# Patient Record
Sex: Female | Born: 1960 | Race: White | Hispanic: No | Smoking: Never smoker
Health system: Southern US, Community
[De-identification: ages and names within clinical notes are randomized; demographics above are authoritative.]

## PROBLEM LIST (undated history)

## (undated) DIAGNOSIS — M1990 Unspecified osteoarthritis, unspecified site: Secondary | ICD-10-CM

## (undated) DIAGNOSIS — I10 Essential (primary) hypertension: Secondary | ICD-10-CM

## (undated) DIAGNOSIS — M797 Fibromyalgia: Secondary | ICD-10-CM

## (undated) HISTORY — DX: Fibromyalgia: M79.7

## (undated) HISTORY — DX: Unspecified osteoarthritis, unspecified site: M19.90

---

## 1997-06-10 ENCOUNTER — Encounter: Admission: RE | Admit: 1997-06-10 | Discharge: 1997-09-08 | Payer: Self-pay | Admitting: *Deleted

## 1999-03-13 ENCOUNTER — Other Ambulatory Visit: Admission: RE | Admit: 1999-03-13 | Discharge: 1999-03-13 | Payer: Self-pay | Admitting: Obstetrics and Gynecology

## 2000-06-03 ENCOUNTER — Other Ambulatory Visit: Admission: RE | Admit: 2000-06-03 | Discharge: 2000-06-03 | Payer: Self-pay | Admitting: Obstetrics and Gynecology

## 2002-03-30 ENCOUNTER — Other Ambulatory Visit: Admission: RE | Admit: 2002-03-30 | Discharge: 2002-03-30 | Payer: Self-pay | Admitting: Obstetrics and Gynecology

## 2002-04-08 ENCOUNTER — Encounter: Admission: RE | Admit: 2002-04-08 | Discharge: 2002-04-08 | Payer: Self-pay | Admitting: Obstetrics and Gynecology

## 2002-04-08 ENCOUNTER — Encounter: Payer: Self-pay | Admitting: Obstetrics and Gynecology

## 2003-12-21 ENCOUNTER — Ambulatory Visit (HOSPITAL_COMMUNITY): Admission: RE | Admit: 2003-12-21 | Discharge: 2003-12-21 | Payer: Self-pay | Admitting: Obstetrics and Gynecology

## 2005-01-08 ENCOUNTER — Other Ambulatory Visit: Admission: RE | Admit: 2005-01-08 | Discharge: 2005-01-08 | Payer: Self-pay | Admitting: Family Medicine

## 2005-01-24 ENCOUNTER — Encounter: Admission: RE | Admit: 2005-01-24 | Discharge: 2005-01-24 | Payer: Self-pay | Admitting: Family Medicine

## 2006-01-29 ENCOUNTER — Encounter: Admission: RE | Admit: 2006-01-29 | Discharge: 2006-01-29 | Payer: Self-pay | Admitting: Family Medicine

## 2007-09-15 ENCOUNTER — Encounter: Admission: RE | Admit: 2007-09-15 | Discharge: 2007-09-15 | Payer: Self-pay | Admitting: Family Medicine

## 2007-11-11 ENCOUNTER — Encounter: Admission: RE | Admit: 2007-11-11 | Discharge: 2007-11-11 | Payer: Self-pay | Admitting: Family Medicine

## 2008-05-16 ENCOUNTER — Encounter: Admission: RE | Admit: 2008-05-16 | Discharge: 2008-05-16 | Payer: Self-pay | Admitting: Family Medicine

## 2009-06-13 ENCOUNTER — Encounter: Admission: RE | Admit: 2009-06-13 | Discharge: 2009-06-13 | Payer: Self-pay | Admitting: Family Medicine

## 2011-02-19 ENCOUNTER — Other Ambulatory Visit: Payer: Self-pay | Admitting: Family Medicine

## 2011-02-19 DIAGNOSIS — Z1231 Encounter for screening mammogram for malignant neoplasm of breast: Secondary | ICD-10-CM

## 2011-03-11 ENCOUNTER — Ambulatory Visit: Payer: Self-pay

## 2011-03-11 ENCOUNTER — Ambulatory Visit
Admission: RE | Admit: 2011-03-11 | Discharge: 2011-03-11 | Disposition: A | Discharge status: Home / Self Care | Payer: 59 | Source: Ambulatory Visit | Attending: Family Medicine | Admitting: Family Medicine

## 2011-03-11 DIAGNOSIS — Z1231 Encounter for screening mammogram for malignant neoplasm of breast: Secondary | ICD-10-CM

## 2011-08-28 ENCOUNTER — Other Ambulatory Visit: Payer: Self-pay | Admitting: Family Medicine

## 2011-08-29 ENCOUNTER — Other Ambulatory Visit: Payer: Self-pay | Admitting: Family Medicine

## 2011-08-29 DIAGNOSIS — N6320 Unspecified lump in the left breast, unspecified quadrant: Secondary | ICD-10-CM

## 2011-08-29 DIAGNOSIS — N6310 Unspecified lump in the right breast, unspecified quadrant: Secondary | ICD-10-CM

## 2011-09-04 ENCOUNTER — Other Ambulatory Visit: Payer: 59

## 2011-09-05 ENCOUNTER — Ambulatory Visit
Admission: RE | Admit: 2011-09-05 | Discharge: 2011-09-05 | Disposition: A | Discharge status: Home / Self Care | Payer: 59 | Source: Ambulatory Visit | Attending: Family Medicine | Admitting: Family Medicine

## 2011-09-05 DIAGNOSIS — N6320 Unspecified lump in the left breast, unspecified quadrant: Secondary | ICD-10-CM

## 2011-09-05 DIAGNOSIS — N6310 Unspecified lump in the right breast, unspecified quadrant: Secondary | ICD-10-CM

## 2012-04-07 ENCOUNTER — Other Ambulatory Visit: Payer: Self-pay | Admitting: Radiology

## 2012-04-08 ENCOUNTER — Telehealth: Payer: Self-pay | Admitting: Radiology

## 2012-04-08 NOTE — Telephone Encounter (Signed)
rec'd [facility] from [facility] about Alprazolam 0.5mg  please advise [facility] [address]

## 2012-04-09 NOTE — Telephone Encounter (Signed)
Needs office visit.  Pt has not been seen by [facility] since we've been on epic

## 2012-04-09 NOTE — Telephone Encounter (Signed)
Called [facility] to advise.

## 2012-04-09 NOTE — Telephone Encounter (Signed)
Called pt, unable to leave VM

## 2012-05-05 ENCOUNTER — Other Ambulatory Visit: Payer: Self-pay | Admitting: Radiology

## 2012-05-05 NOTE — Telephone Encounter (Signed)
Patient needs office visit for meds. Faxed to pharmacy

## 2012-10-21 ENCOUNTER — Ambulatory Visit
Admission: RE | Admit: 2012-10-21 | Discharge: 2012-10-21 | Disposition: A | Discharge status: Home / Self Care | Payer: 59 | Source: Ambulatory Visit

## 2012-10-21 ENCOUNTER — Other Ambulatory Visit: Payer: Self-pay

## 2012-10-21 DIAGNOSIS — Z1231 Encounter for screening mammogram for malignant neoplasm of breast: Secondary | ICD-10-CM

## 2013-10-30 ENCOUNTER — Emergency Department (HOSPITAL_BASED_OUTPATIENT_CLINIC_OR_DEPARTMENT_OTHER)
Admission: EM | Admit: 2013-10-30 | Discharge: 2013-10-30 | Disposition: A | Discharge status: Home / Self Care | Payer: 59 | Attending: Emergency Medicine | Admitting: Emergency Medicine

## 2013-10-30 ENCOUNTER — Emergency Department (HOSPITAL_BASED_OUTPATIENT_CLINIC_OR_DEPARTMENT_OTHER): Payer: 59

## 2013-10-30 ENCOUNTER — Encounter (HOSPITAL_BASED_OUTPATIENT_CLINIC_OR_DEPARTMENT_OTHER): Payer: Self-pay | Admitting: Emergency Medicine

## 2013-10-30 DIAGNOSIS — S4980XA Other specified injuries of shoulder and upper arm, unspecified arm, initial encounter: Secondary | ICD-10-CM | POA: Insufficient documentation

## 2013-10-30 DIAGNOSIS — IMO0002 Reserved for concepts with insufficient information to code with codable children: Secondary | ICD-10-CM | POA: Insufficient documentation

## 2013-10-30 DIAGNOSIS — S43401A Unspecified sprain of right shoulder joint, initial encounter: Secondary | ICD-10-CM

## 2013-10-30 DIAGNOSIS — S46909A Unspecified injury of unspecified muscle, fascia and tendon at shoulder and upper arm level, unspecified arm, initial encounter: Secondary | ICD-10-CM | POA: Diagnosis present

## 2013-10-30 DIAGNOSIS — I10 Essential (primary) hypertension: Secondary | ICD-10-CM | POA: Insufficient documentation

## 2013-10-30 DIAGNOSIS — Z79899 Other long term (current) drug therapy: Secondary | ICD-10-CM | POA: Diagnosis not present

## 2013-10-30 DIAGNOSIS — Y9389 Activity, other specified: Secondary | ICD-10-CM | POA: Insufficient documentation

## 2013-10-30 DIAGNOSIS — X58XXXA Exposure to other specified factors, initial encounter: Secondary | ICD-10-CM | POA: Diagnosis not present

## 2013-10-30 DIAGNOSIS — Y9289 Other specified places as the place of occurrence of the external cause: Secondary | ICD-10-CM | POA: Diagnosis not present

## 2013-10-30 HISTORY — DX: Essential (primary) hypertension: I10

## 2013-10-30 MED ORDER — OXYCODONE-ACETAMINOPHEN 5-325 MG PO TABS: 1.0000 | ORAL_TABLET | Freq: Four times a day (QID) | ORAL | Status: DC | PRN

## 2013-10-30 MED ORDER — HYDROCODONE-ACETAMINOPHEN 5-325 MG PO TABS: 1.0000 | ORAL_TABLET | Freq: Four times a day (QID) | ORAL | Status: DC | PRN

## 2013-10-30 MED ORDER — IBUPROFEN 600 MG PO TABS
600.0000 mg | ORAL_TABLET | Freq: Four times a day (QID) | ORAL | Status: DC | PRN
Start: 2013-10-30 — End: 2019-04-14

## 2013-10-30 NOTE — ED Provider Notes (Signed)
CSN: [id number]     Arrival date & time 10/30/13  1238 History   First MD Initiated Contact with Patient 10/30/13 1249     Chief Complaint  Patient presents with  . Arm Pain     (Consider location/radiation/quality/duration/timing/severity/associated sxs/prior Treatment) HPI  Presents with right arm pain. She states that she forcibly opened her umbrella yesterday and since that time has had pain with range of motion and limited range of motion. She is tried hydrocodone at home with minimal relief. She is a [employer] and has had difficulty doing her job today.  Current pain is 7/10.  Past Medical History  Diagnosis Date  . Hypertension    History reviewed. No pertinent past surgical history. No family history on file. History  Substance Use Topics  . Smoking status: Never Smoker   . Smokeless tobacco: Not on file  . Alcohol Use: Not on file   OB History   Grav Para Term Preterm Abortions TAB SAB Ect Mult Living                 Review of Systems  Musculoskeletal:       Right arm pain  Skin: Negative for wound.      Allergies  Review of patient's allergies indicates no known allergies.  Home Medications   Prior to Admission medications   Medication Sig Start Date End Date Taking? Authorizing Provider  hydrochlorothiazide (HYDRODIURIL) 25 MG tablet Take 25 mg by mouth daily.   Yes Historical Provider, MD  ibuprofen (ADVIL,MOTRIN) 600 MG tablet Take 1 tablet (600 mg total) by mouth every 6 (six) hours as needed. 10/30/13   Merryl Hacker, MD  oxyCODONE-acetaminophen (PERCOCET/ROXICET) 5-325 MG per tablet Take 1 tablet by mouth every 6 (six) hours as needed for moderate pain or severe pain. 10/30/13   Merryl Hacker, MD   BP 130/97  Pulse 67  Temp(Src) 98.2 F (36.8 C) (Oral)  Resp 18  Ht 5\' 4"  (1.626 m)  Wt 197 lb (89.359 kg)  BMI 33.80 kg/m2  SpO2 98%  LMP 10/25/2013 Physical Exam  Nursing note and vitals reviewed. Constitutional: She is oriented to  person, place, and time. She appears well-developed and well-nourished. No distress.  HENT:  Head: Normocephalic and atraumatic.  Cardiovascular: Normal rate and regular rhythm.   Pulmonary/Chest: Effort normal. No respiratory distress.  Musculoskeletal: She exhibits no edema.  This examination of the right shoulder reveals no obvious deformity, patient has good passive range of motion limited active range of motion secondary to pain, 2+ radial pulse, neurovascularly intact distally, 5 out of 5 strength with grip and biceps strength, 4+ out of 5 deltoid raise  Neurological: She is alert and oriented to person, place, and time.  Skin: Skin is warm and dry.  Psychiatric: She has a normal mood and affect.    ED Course  Procedures (including critical care time) Labs Review Labs Reviewed - No data to display  Imaging Review Dg Shoulder Right  10/30/2013   CLINICAL DATA:  Injury.  EXAM: RIGHT SHOULDER - 2+ VIEW  COMPARISON:  None.  FINDINGS: Acromioclavicular and glenohumeral degenerative change. No evidence of fracture dislocation.  IMPRESSION: No acute abnormality .   Electronically Signed   By: Marcello Moores  Register   On: 10/30/2013 14:17     EKG Interpretation None      MDM   Final diagnoses:  Shoulder sprain, right, initial encounter    Patient presents with right shoulder pain. Nontoxic and neurovascular tach on  exam. X-rays negative for fracture. Patient is driving so was not given pain medication. Discussed with patient negative x-rays. She's used rest ice as well as pain medication. She may have a shoulder sprain or rotator cuff injury. At this time there is no emergent indication for MRI.  Patient was given referral to Dr. Barbaraann Barthel.  After history, exam, and medical workup I feel the patient has been appropriately medically screened and is safe for discharge home. Pertinent diagnoses were discussed with the patient. Patient was given return precautions.     Merryl Hacker,  MD 10/31/13 [phone]

## 2013-10-30 NOTE — Discharge Instructions (Signed)
Shoulder Sprain °A shoulder sprain is the result of damage to the tough, fiber-like tissues (ligaments) that help hold your shoulder in place. The ligaments may be stretched or torn. Besides the main shoulder joint (the ball and socket), there are several smaller joints that connect the bones in this area. A sprain usually involves one of those joints. Most often it is the acromioclavicular (or AC) joint. That is the joint that connects the collarbone (clavicle) and the shoulder blade (scapula) at the top point of the shoulder blade (acromion). °A shoulder sprain is a mild form of what is called a shoulder separation. Recovering from a shoulder sprain may take some time. For some, pain lingers for several months. Most people recover without long term problems. °CAUSES  °· A shoulder sprain is usually caused by some kind of trauma. This might be: °¨ Falling on an outstretched arm. °¨ Being hit hard on the shoulder. °¨ Twisting the arm. °· Shoulder sprains are more likely to occur in people who: °¨ Play sports. °¨ Have balance or coordination problems. °SYMPTOMS  °· Pain when you move your shoulder. °· Limited ability to move the shoulder. °· Swelling and tenderness on top of the shoulder. °· Redness or warmth in the shoulder. °· Bruising. °· A change in the shape of the shoulder. °DIAGNOSIS  °Your healthcare provider may: °· Ask about your symptoms. °· Ask about recent activity that might have caused those symptoms. °· Examine your shoulder. You may be asked to do simple exercises to test movement. The other shoulder will be examined for comparison. °· Order some tests that provide a look inside the body. They can show the extent of the injury. The tests could include: °¨ X-rays. °¨ CT (computed tomography) scan. °¨ MRI (magnetic resonance imaging) scan. °RISKS AND COMPLICATIONS °· Loss of full shoulder motion. °· Ongoing shoulder pain. °TREATMENT  °How long it takes to recover from a shoulder sprain depends on how  severe it was. Treatment options may include: °· Rest. You should not use the arm or shoulder until it heals. °· Ice. For 2 or 3 days after the injury, put an ice pack on the shoulder up to 4 times a day. It should stay on for 15 to 20 minutes each time. Wrap the ice in a towel so it does not touch your skin. °· Over-the-counter medicine to relieve pain. °· A sling or brace. This will keep the arm still while the shoulder is healing. °· Physical therapy or rehabilitation exercises. These will help you regain strength and motion. Ask your healthcare provider when it is OK to begin these exercises. °· Surgery. The need for surgery is rare with a sprained shoulder, but some people may need surgery to keep the joint in place and reduce pain. °HOME CARE INSTRUCTIONS  °· Ask your healthcare provider about what you should and should not do while your shoulder heals. °· Make sure you know how to apply ice to the correct area of your shoulder. °· Talk with your healthcare provider about which medications should be used for pain and swelling. °· If rehabilitation therapy will be needed, ask your healthcare provider to refer you to a therapist. If it is not recommended, then ask about at-home exercises. Find out when exercise should begin. °SEEK MEDICAL CARE IF:  °Your pain, swelling, or redness at the joint increases. °SEEK IMMEDIATE MEDICAL CARE IF:  °· You have a fever. °· You cannot move your arm or shoulder. °Document Released: 06/09/2008 Document   Revised: 04/15/2011 Document Reviewed: 06/09/2008 °[employer]® Patient Information ©2015 [employer]. This information is not intended to replace advice given to you by your health care provider. Make sure you discuss any questions you have with your health care provider. ° °

## 2013-10-30 NOTE — ED Notes (Signed)
Pt presents to ED with complaints of rt arm pain after opening an umbrella yesterday. 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

## 2013-11-12 ENCOUNTER — Other Ambulatory Visit: Payer: Self-pay

## 2013-11-12 DIAGNOSIS — Z1231 Encounter for screening mammogram for malignant neoplasm of breast: Secondary | ICD-10-CM

## 2013-11-22 ENCOUNTER — Ambulatory Visit
Admission: RE | Admit: 2013-11-22 | Discharge: 2013-11-22 | Disposition: A | Discharge status: Home / Self Care | Payer: 59 | Source: Ambulatory Visit

## 2013-11-22 DIAGNOSIS — Z1231 Encounter for screening mammogram for malignant neoplasm of breast: Secondary | ICD-10-CM

## 2014-01-04 ENCOUNTER — Other Ambulatory Visit: Payer: Self-pay | Admitting: Family Medicine

## 2014-07-11 ENCOUNTER — Ambulatory Visit (HOSPITAL_COMMUNITY): Payer: Self-pay | Admitting: Psychiatry

## 2014-09-08 ENCOUNTER — Telehealth: Payer: Self-pay | Admitting: Behavioral Health

## 2014-09-08 NOTE — Telephone Encounter (Signed)
Unable to reach patient at time of Pre-Visit Call.  Left message for patient to return call when available.    

## 2014-09-09 ENCOUNTER — Encounter: Payer: Self-pay | Admitting: Family Medicine

## 2014-09-09 ENCOUNTER — Ambulatory Visit (INDEPENDENT_AMBULATORY_CARE_PROVIDER_SITE_OTHER): Payer: 59 | Admitting: Family Medicine

## 2014-09-09 ENCOUNTER — Other Ambulatory Visit (HOSPITAL_COMMUNITY)
Admission: RE | Admit: 2014-09-09 | Discharge: 2014-09-09 | Disposition: A | Discharge status: Home / Self Care | Payer: 59 | Source: Ambulatory Visit | Attending: Family Medicine | Admitting: Family Medicine

## 2014-09-09 VITALS — BP 130/80 | HR 72 | Temp 98.4°F | Ht 65.0 in | Wt 195.8 lb

## 2014-09-09 DIAGNOSIS — M797 Fibromyalgia: Secondary | ICD-10-CM

## 2014-09-09 DIAGNOSIS — K219 Gastro-esophageal reflux disease without esophagitis: Secondary | ICD-10-CM

## 2014-09-09 DIAGNOSIS — Z124 Encounter for screening for malignant neoplasm of cervix: Secondary | ICD-10-CM | POA: Diagnosis not present

## 2014-09-09 DIAGNOSIS — M1990 Unspecified osteoarthritis, unspecified site: Secondary | ICD-10-CM

## 2014-09-09 DIAGNOSIS — Z0000 Encounter for general adult medical examination without abnormal findings: Secondary | ICD-10-CM

## 2014-09-09 DIAGNOSIS — I10 Essential (primary) hypertension: Secondary | ICD-10-CM

## 2014-09-09 DIAGNOSIS — Z1151 Encounter for screening for human papillomavirus (HPV): Secondary | ICD-10-CM | POA: Diagnosis present

## 2014-09-09 DIAGNOSIS — F411 Generalized anxiety disorder: Secondary | ICD-10-CM | POA: Diagnosis not present

## 2014-09-09 DIAGNOSIS — G4700 Insomnia, unspecified: Secondary | ICD-10-CM | POA: Diagnosis not present

## 2014-09-09 DIAGNOSIS — Z01419 Encounter for gynecological examination (general) (routine) without abnormal findings: Secondary | ICD-10-CM | POA: Insufficient documentation

## 2014-09-09 LAB — HEPATIC FUNCTION PANEL
ALT: 17 U/L (ref 6–29)
AST: 19 U/L (ref 10–35)
Albumin: 4.1 g/dL (ref 3.6–5.1)
Alkaline Phosphatase: 73 U/L (ref 33–130)
Bilirubin, Direct: 0.1 mg/dL (ref <=0.2)
Indirect Bilirubin: 0.3 mg/dL (ref 0.2–1.2)
Total Bilirubin: 0.4 mg/dL (ref 0.2–1.2)
Total Protein: 6.5 g/dL (ref 6.1–8.1)

## 2014-09-09 LAB — BASIC METABOLIC PANEL
BUN: 12 mg/dL (ref 7–25)
CO2: 27 mmol/L (ref 20–31)
Calcium: 9.1 mg/dL (ref 8.6–10.4)
Chloride: 101 mmol/L (ref 98–110)
Creat: 0.72 mg/dL (ref 0.50–1.05)
Glucose, Bld: 105 mg/dL — ABNORMAL HIGH (ref 65–99)
Potassium: 3.4 mmol/L — ABNORMAL LOW (ref 3.5–5.3)
Sodium: 139 mmol/L (ref 135–146)

## 2014-09-09 LAB — LIPID PANEL
Cholesterol: 278 mg/dL — ABNORMAL HIGH (ref 125–200)
HDL: 45 mg/dL — ABNORMAL LOW (ref >=46)
LDL Cholesterol: 193 mg/dL — ABNORMAL HIGH (ref <130)
Total CHOL/HDL Ratio: 6.2 Ratio — ABNORMAL HIGH (ref <=5.0)
Triglycerides: 201 mg/dL — ABNORMAL HIGH (ref <150)
VLDL: 40 mg/dL — ABNORMAL HIGH (ref <30)

## 2014-09-09 LAB — TSH: TSH: 1.994 u[IU]/mL (ref 0.350–4.500)

## 2014-09-09 LAB — RHEUMATOID FACTOR: Rhuematoid fact SerPl-aCnc: <10 IU/mL (ref <=14)

## 2014-09-09 MED ORDER — OMEPRAZOLE 20 MG PO CPDR: 20.0000 mg | DELAYED_RELEASE_CAPSULE | Freq: Every day | ORAL | Status: DC

## 2014-09-09 MED ORDER — CYCLOBENZAPRINE HCL 5 MG PO TABS: 5.0000 mg | ORAL_TABLET | Freq: Three times a day (TID) | ORAL | Status: DC | PRN

## 2014-09-09 MED ORDER — OXYCODONE-ACETAMINOPHEN 5-325 MG PO TABS: 1.0000 | ORAL_TABLET | Freq: Four times a day (QID) | ORAL | Status: DC | PRN

## 2014-09-09 MED ORDER — HYDROCHLOROTHIAZIDE 25 MG PO TABS: 25.0000 mg | ORAL_TABLET | Freq: Every day | ORAL | Status: DC

## 2014-09-09 MED ORDER — SUVOREXANT 10 MG PO TABS: 10.0000 | ORAL_TABLET | Freq: Every evening | ORAL | Status: DC | PRN

## 2014-09-09 MED ORDER — ALPRAZOLAM 0.5 MG PO TABS: 0.5000 mg | ORAL_TABLET | Freq: Three times a day (TID) | ORAL | Status: DC | PRN

## 2014-09-09 NOTE — Patient Instructions (Signed)
Preventive Care for Adults A healthy lifestyle and preventive care can promote health and wellness. Preventive health guidelines for women include the following key practices.  A routine yearly physical is a good way to check with your health care provider about your health and preventive screening. It is a chance to share any concerns and updates on your health and to receive a thorough exam.  Visit your dentist for a routine exam and preventive care every 6 months. Brush your teeth twice a day and floss once a day. Good oral hygiene prevents tooth decay and gum disease.  The frequency of eye exams is based on your age, health, family medical history, use of contact lenses, and other factors. Follow your health care provider's recommendations for frequency of eye exams.  Eat a healthy diet. Foods like vegetables, fruits, whole grains, low-fat dairy products, and lean protein foods contain the nutrients you need without too many calories. Decrease your intake of foods high in solid fats, added sugars, and salt. Eat the right amount of calories for you.Get information about a proper diet from your health care provider, if necessary.  Regular physical exercise is one of the most important things you can do for your health. Most adults should get at least 150 minutes of moderate-intensity exercise (any activity that increases your heart rate and causes you to sweat) each week. In addition, most adults need muscle-strengthening exercises on 2 or more days a week.  Maintain a healthy weight. The body mass index (BMI) is a screening tool to identify possible weight problems. It provides an estimate of body fat based on height and weight. Your health care provider can find your BMI and can help you achieve or maintain a healthy weight.For adults 20 years and older:  A BMI below 18.5 is considered underweight.  A BMI of 18.5 to 24.9 is normal.  A BMI of 25 to 29.9 is considered overweight.  A BMI of  30 and above is considered obese.  Maintain normal blood lipids and cholesterol levels by exercising and minimizing your intake of saturated fat. Eat a balanced diet with plenty of fruit and vegetables. Blood tests for lipids and cholesterol should begin at age 76 and be repeated every 5 years. If your lipid or cholesterol levels are high, you are over 50, or you are at high risk for heart disease, you may need your cholesterol levels checked more frequently.Ongoing high lipid and cholesterol levels should be treated with medicines if diet and exercise are not working.  If you smoke, find out from your health care provider how to quit. If you do not use tobacco, do not start.  Lung cancer screening is recommended for adults aged 22-80 years who are at high risk for developing lung cancer because of a history of smoking. A yearly low-dose CT scan of the lungs is recommended for people who have at least a 30-pack-year history of smoking and are a current smoker or have quit within the past 15 years. A pack year of smoking is smoking an average of 1 pack of cigarettes a day for 1 year (for example: 1 pack a day for 30 years or 2 packs a day for 15 years). Yearly screening should continue until the smoker has stopped smoking for at least 15 years. Yearly screening should be stopped for people who develop a health problem that would prevent them from having lung cancer treatment.  If you are pregnant, do not drink alcohol. If you are breastfeeding,  be very cautious about drinking alcohol. If you are not pregnant and choose to drink alcohol, do not have more than 1 drink per day. One drink is considered to be 12 ounces (355 mL) of beer, 5 ounces (148 mL) of wine, or 1.5 ounces (44 mL) of liquor.  Avoid use of street drugs. Do not share needles with anyone. Ask for help if you need support or instructions about stopping the use of drugs.  High blood pressure causes heart disease and increases the risk of  stroke. Your blood pressure should be checked at least every 1 to 2 years. Ongoing high blood pressure should be treated with medicines if weight loss and exercise do not work.  If you are 75-52 years old, ask your health care provider if you should take aspirin to prevent strokes.  Diabetes screening involves taking a blood sample to check your fasting blood sugar level. This should be done once every 3 years, after age 15, if you are within normal weight and without risk factors for diabetes. Testing should be considered at a younger age or be carried out more frequently if you are overweight and have at least 1 risk factor for diabetes.  Breast cancer screening is essential preventive care for women. You should practice "breast self-awareness." This means understanding the normal appearance and feel of your breasts and may include breast self-examination. Any changes detected, no matter how small, should be reported to a health care provider. Women in their 58s and 30s should have a clinical breast exam (CBE) by a health care provider as part of a regular health exam every 1 to 3 years. After age 16, women should have a CBE every year. Starting at age 53, women should consider having a mammogram (breast X-ray test) every year. Women who have a family history of breast cancer should talk to their health care provider about genetic screening. Women at a high risk of breast cancer should talk to their health care providers about having an MRI and a mammogram every year.  Breast cancer gene (BRCA)-related cancer risk assessment is recommended for women who have family members with BRCA-related cancers. BRCA-related cancers include breast, ovarian, tubal, and peritoneal cancers. Having family members with these cancers may be associated with an increased risk for harmful changes (mutations) in the breast cancer genes BRCA1 and BRCA2. Results of the assessment will determine the need for genetic counseling and  BRCA1 and BRCA2 testing.  Routine pelvic exams to screen for cancer are no longer recommended for nonpregnant women who are considered low risk for cancer of the pelvic organs (ovaries, uterus, and vagina) and who do not have symptoms. Ask your health care provider if a screening pelvic exam is right for you.  If you have had past treatment for cervical cancer or a condition that could lead to cancer, you need Pap tests and screening for cancer for at least 20 years after your treatment. If Pap tests have been discontinued, your risk factors (such as having a new sexual partner) need to be reassessed to determine if screening should be resumed. Some women have medical problems that increase the chance of getting cervical cancer. In these cases, your health care provider may recommend more frequent screening and Pap tests.  The HPV test is an additional test that may be used for cervical cancer screening. The HPV test looks for the virus that can cause the cell changes on the cervix. The cells collected during the Pap test can be  tested for HPV. The HPV test could be used to screen women aged 30 years and older, and should be used in women of any age who have unclear Pap test results. After the age of 30, women should have HPV testing at the same frequency as a Pap test.  Colorectal cancer can be detected and often prevented. Most routine colorectal cancer screening begins at the age of 50 years and continues through age 75 years. However, your health care provider may recommend screening at an earlier age if you have risk factors for colon cancer. On a yearly basis, your health care provider may provide home test kits to check for hidden blood in the stool. Use of a small camera at the end of a tube, to directly examine the colon (sigmoidoscopy or colonoscopy), can detect the earliest forms of colorectal cancer. Talk to your health care provider about this at age 50, when routine screening begins. Direct  exam of the colon should be repeated every 5-10 years through age 75 years, unless early forms of pre-cancerous polyps or small growths are found.  People who are at an increased risk for hepatitis B should be screened for this virus. You are considered at high risk for hepatitis B if:  You were born in a country where hepatitis B occurs often. Talk with your health care provider about which countries are considered high risk.  Your parents were born in a high-risk country and you have not received a shot to protect against hepatitis B (hepatitis B vaccine).  You have HIV or AIDS.  You use needles to inject street drugs.  You live with, or have sex with, someone who has hepatitis B.  You get hemodialysis treatment.  You take certain medicines for conditions like cancer, organ transplantation, and autoimmune conditions.  Hepatitis C blood testing is recommended for all people born from 1945 through 1965 and any individual with known risks for hepatitis C.  Practice safe sex. Use condoms and avoid high-risk sexual practices to reduce the spread of sexually transmitted infections (STIs). STIs include gonorrhea, chlamydia, syphilis, trichomonas, herpes, HPV, and human immunodeficiency virus (HIV). Herpes, HIV, and HPV are viral illnesses that have no cure. They can result in disability, cancer, and death.  You should be screened for sexually transmitted illnesses (STIs) including gonorrhea and chlamydia if:  You are sexually active and are younger than 24 years.  You are older than 24 years and your health care provider tells you that you are at risk for this type of infection.  Your sexual activity has changed since you were last screened and you are at an increased risk for chlamydia or gonorrhea. Ask your health care provider if you are at risk.  If you are at risk of being infected with HIV, it is recommended that you take a prescription medicine daily to prevent HIV infection. This is  called preexposure prophylaxis (PrEP). You are considered at risk if:  You are a heterosexual woman, are sexually active, and are at increased risk for HIV infection.  You take drugs by injection.  You are sexually active with a partner who has HIV.  Talk with your health care provider about whether you are at high risk of being infected with HIV. If you choose to begin PrEP, you should first be tested for HIV. You should then be tested every 3 months for as long as you are taking PrEP.  Osteoporosis is a disease in which the bones lose minerals and strength   with aging. This can result in serious bone fractures or breaks. The risk of osteoporosis can be identified using a bone density scan. Women ages 65 years and over and women at risk for fractures or osteoporosis should discuss screening with their health care providers. Ask your health care provider whether you should take a calcium supplement or vitamin D to reduce the rate of osteoporosis.  Menopause can be associated with physical symptoms and risks. Hormone replacement therapy is available to decrease symptoms and risks. You should talk to your health care provider about whether hormone replacement therapy is right for you.  Use sunscreen. Apply sunscreen liberally and repeatedly throughout the day. You should seek shade when your shadow is shorter than you. Protect yourself by wearing long sleeves, pants, a wide-brimmed hat, and sunglasses year round, whenever you are outdoors.  Once a month, do a whole body skin exam, using a mirror to look at the skin on your back. Tell your health care provider of new moles, moles that have irregular borders, moles that are larger than a pencil eraser, or moles that have changed in shape or color.  Stay current with required vaccines (immunizations).  Influenza vaccine. All adults should be immunized every year.  Tetanus, diphtheria, and acellular pertussis (Td, Tdap) vaccine. Pregnant women should  receive 1 dose of Tdap vaccine during each pregnancy. The dose should be obtained regardless of the length of time since the last dose. Immunization is preferred during the 27th-36th week of gestation. An adult who has not previously received Tdap or who does not know her vaccine status should receive 1 dose of Tdap. This initial dose should be followed by tetanus and diphtheria toxoids (Td) booster doses every 10 years. Adults with an unknown or incomplete history of completing a 3-dose immunization series with Td-containing vaccines should begin or complete a primary immunization series including a Tdap dose. Adults should receive a Td booster every 10 years.  Varicella vaccine. An adult without evidence of immunity to varicella should receive 2 doses or a second dose if she has previously received 1 dose. Pregnant females who do not have evidence of immunity should receive the first dose after pregnancy. This first dose should be obtained before leaving the health care facility. The second dose should be obtained 4-8 weeks after the first dose.  Human papillomavirus (HPV) vaccine. Females aged 13-26 years who have not received the vaccine previously should obtain the 3-dose series. The vaccine is not recommended for use in pregnant females. However, pregnancy testing is not needed before receiving a dose. If a female is found to be pregnant after receiving a dose, no treatment is needed. In that case, the remaining doses should be delayed until after the pregnancy. Immunization is recommended for any person with an immunocompromised condition through the age of 26 years if she did not get any or all doses earlier. During the 3-dose series, the second dose should be obtained 4-8 weeks after the first dose. The third dose should be obtained 24 weeks after the first dose and 16 weeks after the second dose.  Zoster vaccine. One dose is recommended for adults aged 60 years or older unless certain conditions are  present.  Measles, mumps, and rubella (MMR) vaccine. Adults born before 1957 generally are considered immune to measles and mumps. Adults born in 1957 or later should have 1 or more doses of MMR vaccine unless there is a contraindication to the vaccine or there is laboratory evidence of immunity to   each of the three diseases. A routine second dose of MMR vaccine should be obtained at least 28 days after the first dose for students attending postsecondary schools, health care workers, or international travelers. People who received inactivated measles vaccine or an unknown type of measles vaccine during 1963-1967 should receive 2 doses of MMR vaccine. People who received inactivated mumps vaccine or an unknown type of mumps vaccine before 1979 and are at high risk for mumps infection should consider immunization with 2 doses of MMR vaccine. For females of childbearing age, rubella immunity should be determined. If there is no evidence of immunity, females who are not pregnant should be vaccinated. If there is no evidence of immunity, females who are pregnant should delay immunization until after pregnancy. Unvaccinated health care workers born before 1957 who lack laboratory evidence of measles, mumps, or rubella immunity or laboratory confirmation of disease should consider measles and mumps immunization with 2 doses of MMR vaccine or rubella immunization with 1 dose of MMR vaccine.  Pneumococcal 13-valent conjugate (PCV13) vaccine. When indicated, a person who is uncertain of her immunization history and has no record of immunization should receive the PCV13 vaccine. An adult aged 19 years or older who has certain medical conditions and has not been previously immunized should receive 1 dose of PCV13 vaccine. This PCV13 should be followed with a dose of pneumococcal polysaccharide (PPSV23) vaccine. The PPSV23 vaccine dose should be obtained at least 8 weeks after the dose of PCV13 vaccine. An adult aged 19  years or older who has certain medical conditions and previously received 1 or more doses of PPSV23 vaccine should receive 1 dose of PCV13. The PCV13 vaccine dose should be obtained 1 or more years after the last PPSV23 vaccine dose.  Pneumococcal polysaccharide (PPSV23) vaccine. When PCV13 is also indicated, PCV13 should be obtained first. All adults aged 65 years and older should be immunized. An adult younger than age 65 years who has certain medical conditions should be immunized. Any person who resides in a nursing home or long-term care facility should be immunized. An adult smoker should be immunized. People with an immunocompromised condition and certain other conditions should receive both PCV13 and PPSV23 vaccines. People with human immunodeficiency virus (HIV) infection should be immunized as soon as possible after diagnosis. Immunization during chemotherapy or radiation therapy should be avoided. Routine use of PPSV23 vaccine is not recommended for American Indians, [place] Natives, or people younger than 65 years unless there are medical conditions that require PPSV23 vaccine. When indicated, people who have unknown immunization and have no record of immunization should receive PPSV23 vaccine. One-time revaccination 5 years after the first dose of PPSV23 is recommended for people aged 19-64 years who have chronic kidney failure, nephrotic syndrome, asplenia, or immunocompromised conditions. People who received 1-2 doses of PPSV23 before age 65 years should receive another dose of PPSV23 vaccine at age 65 years or later if at least 5 years have passed since the previous dose. Doses of PPSV23 are not needed for people immunized with PPSV23 at or after age 65 years.  Meningococcal vaccine. Adults with asplenia or persistent complement component deficiencies should receive 2 doses of quadrivalent meningococcal conjugate (MenACWY-D) vaccine. The doses should be obtained at least 2 months apart.  Microbiologists working with certain meningococcal bacteria, military recruits, people at risk during an outbreak, and people who travel to or live in countries with a high rate of meningitis should be immunized. A first-year college student up through age   21 years who is living in a residence hall should receive a dose if she did not receive a dose on or after her 16th birthday. Adults who have certain high-risk conditions should receive one or more doses of vaccine.  Hepatitis A vaccine. Adults who wish to be protected from this disease, have certain high-risk conditions, work with hepatitis A-infected animals, work in hepatitis A research labs, or travel to or work in countries with a high rate of hepatitis A should be immunized. Adults who were previously unvaccinated and who anticipate close contact with an international adoptee during the first 60 days after arrival in the [place] from a country with a high rate of hepatitis A should be immunized.  Hepatitis B vaccine. Adults who wish to be protected from this disease, have certain high-risk conditions, may be exposed to blood or other infectious body fluids, are household contacts or sex partners of hepatitis B positive people, are clients or workers in certain care facilities, or travel to or work in countries with a high rate of hepatitis B should be immunized.  Haemophilus influenzae type b (Hib) vaccine. A previously unvaccinated person with asplenia or sickle cell disease or having a scheduled splenectomy should receive 1 dose of Hib vaccine. Regardless of previous immunization, a recipient of a hematopoietic stem cell transplant should receive a 3-dose series 6-12 months after her successful transplant. Hib vaccine is not recommended for adults with HIV infection. Preventive Services / Frequency Ages 64 to 68 years  Blood pressure check.** / Every 1 to 2 years.  Lipid and cholesterol check.** / Every 5 years beginning at age  22.  Clinical breast exam.** / Every 3 years for women in their 88s and 53s.  BRCA-related cancer risk assessment.** / For women who have family members with a BRCA-related cancer (breast, ovarian, tubal, or peritoneal cancers).  Pap test.** / Every 2 years from ages 90 through 51. Every 3 years starting at age 21 through age 56 or 3 with a history of 3 consecutive normal Pap tests.  HPV screening.** / Every 3 years from ages 24 through ages 1 to 46 with a history of 3 consecutive normal Pap tests.  Hepatitis C blood test.** / For any individual with known risks for hepatitis C.  Skin self-exam. / Monthly.  Influenza vaccine. / Every year.  Tetanus, diphtheria, and acellular pertussis (Tdap, Td) vaccine.** / Consult your health care provider. Pregnant women should receive 1 dose of Tdap vaccine during each pregnancy. 1 dose of Td every 10 years.  Varicella vaccine.** / Consult your health care provider. Pregnant females who do not have evidence of immunity should receive the first dose after pregnancy.  HPV vaccine. / 3 doses over 6 months, if 72 and younger. The vaccine is not recommended for use in pregnant females. However, pregnancy testing is not needed before receiving a dose.  Measles, mumps, rubella (MMR) vaccine.** / You need at least 1 dose of MMR if you were born in 1957 or later. You may also need a 2nd dose. For females of childbearing age, rubella immunity should be determined. If there is no evidence of immunity, females who are not pregnant should be vaccinated. If there is no evidence of immunity, females who are pregnant should delay immunization until after pregnancy.  Pneumococcal 13-valent conjugate (PCV13) vaccine.** / Consult your health care provider.  Pneumococcal polysaccharide (PPSV23) vaccine.** / 1 to 2 doses if you smoke cigarettes or if you have certain conditions.  Meningococcal vaccine.** /  1 dose if you are age 19 to 21 years and a first-year college  student living in a residence hall, or have one of several medical conditions, you need to get vaccinated against meningococcal disease. You may also need additional booster doses.  Hepatitis A vaccine.** / Consult your health care provider.  Hepatitis B vaccine.** / Consult your health care provider.  Haemophilus influenzae type b (Hib) vaccine.** / Consult your health care provider. Ages 40 to 64 years  Blood pressure check.** / Every 1 to 2 years.  Lipid and cholesterol check.** / Every 5 years beginning at age 20 years.  Lung cancer screening. / Every year if you are aged 55-80 years and have a 30-pack-year history of smoking and currently smoke or have quit within the past 15 years. Yearly screening is stopped once you have quit smoking for at least 15 years or develop a health problem that would prevent you from having lung cancer treatment.  Clinical breast exam.** / Every year after age 40 years.  BRCA-related cancer risk assessment.** / For women who have family members with a BRCA-related cancer (breast, ovarian, tubal, or peritoneal cancers).  Mammogram.** / Every year beginning at age 40 years and continuing for as long as you are in good health. Consult with your health care provider.  Pap test.** / Every 3 years starting at age 30 years through age 65 or 70 years with a history of 3 consecutive normal Pap tests.  HPV screening.** / Every 3 years from ages 30 years through ages 65 to 70 years with a history of 3 consecutive normal Pap tests.  Fecal occult blood test (FOBT) of stool. / Every year beginning at age 50 years and continuing until age 75 years. You may not need to do this test if you get a colonoscopy every 10 years.  Flexible sigmoidoscopy or colonoscopy.** / Every 5 years for a flexible sigmoidoscopy or every 10 years for a colonoscopy beginning at age 50 years and continuing until age 75 years.  Hepatitis C blood test.** / For all people born from 1945 through  1965 and any individual with known risks for hepatitis C.  Skin self-exam. / Monthly.  Influenza vaccine. / Every year.  Tetanus, diphtheria, and acellular pertussis (Tdap/Td) vaccine.** / Consult your health care provider. Pregnant women should receive 1 dose of Tdap vaccine during each pregnancy. 1 dose of Td every 10 years.  Varicella vaccine.** / Consult your health care provider. Pregnant females who do not have evidence of immunity should receive the first dose after pregnancy.  Zoster vaccine.** / 1 dose for adults aged 60 years or older.  Measles, mumps, rubella (MMR) vaccine.** / You need at least 1 dose of MMR if you were born in 1957 or later. You may also need a 2nd dose. For females of childbearing age, rubella immunity should be determined. If there is no evidence of immunity, females who are not pregnant should be vaccinated. If there is no evidence of immunity, females who are pregnant should delay immunization until after pregnancy.  Pneumococcal 13-valent conjugate (PCV13) vaccine.** / Consult your health care provider.  Pneumococcal polysaccharide (PPSV23) vaccine.** / 1 to 2 doses if you smoke cigarettes or if you have certain conditions.  Meningococcal vaccine.** / Consult your health care provider.  Hepatitis A vaccine.** / Consult your health care provider.  Hepatitis B vaccine.** / Consult your health care provider.  Haemophilus influenzae type b (Hib) vaccine.** / Consult your health care provider. Ages 65   years and over  Blood pressure check.** / Every 1 to 2 years.  Lipid and cholesterol check.** / Every 5 years beginning at age 22 years.  Lung cancer screening. / Every year if you are aged 73-80 years and have a 30-pack-year history of smoking and currently smoke or have quit within the past 15 years. Yearly screening is stopped once you have quit smoking for at least 15 years or develop a health problem that would prevent you from having lung cancer  treatment.  Clinical breast exam.** / Every year after age 4 years.  BRCA-related cancer risk assessment.** / For women who have family members with a BRCA-related cancer (breast, ovarian, tubal, or peritoneal cancers).  Mammogram.** / Every year beginning at age 40 years and continuing for as long as you are in good health. Consult with your health care provider.  Pap test.** / Every 3 years starting at age 9 years through age 34 or 91 years with 3 consecutive normal Pap tests. Testing can be stopped between 65 and 70 years with 3 consecutive normal Pap tests and no abnormal Pap or HPV tests in the past 10 years.  HPV screening.** / Every 3 years from ages 57 years through ages 64 or 45 years with a history of 3 consecutive normal Pap tests. Testing can be stopped between 65 and 70 years with 3 consecutive normal Pap tests and no abnormal Pap or HPV tests in the past 10 years.  Fecal occult blood test (FOBT) of stool. / Every year beginning at age 15 years and continuing until age 17 years. You may not need to do this test if you get a colonoscopy every 10 years.  Flexible sigmoidoscopy or colonoscopy.** / Every 5 years for a flexible sigmoidoscopy or every 10 years for a colonoscopy beginning at age 86 years and continuing until age 71 years.  Hepatitis C blood test.** / For all people born from [birth year] through 1965 and any individual with known risks for hepatitis C.  Osteoporosis screening.** / A one-time screening for women ages 83 years and over and women at risk for fractures or osteoporosis.  Skin self-exam. / Monthly.  Influenza vaccine. / Every year.  Tetanus, diphtheria, and acellular pertussis (Tdap/Td) vaccine.** / 1 dose of Td every 10 years.  Varicella vaccine.** / Consult your health care provider.  Zoster vaccine.** / 1 dose for adults aged 61 years or older.  Pneumococcal 13-valent conjugate (PCV13) vaccine.** / Consult your health care provider.  Pneumococcal  polysaccharide (PPSV23) vaccine.** / 1 dose for all adults aged 28 years and older.  Meningococcal vaccine.** / Consult your health care provider.  Hepatitis A vaccine.** / Consult your health care provider.  Hepatitis B vaccine.** / Consult your health care provider.  Haemophilus influenzae type b (Hib) vaccine.** / Consult your health care provider. ** Family history and personal history of risk and conditions may change your health care provider's recommendations. Document Released: 03/19/2001 Document Revised: 06/07/2013 Document Reviewed: 06/18/2010 [facility] Patient Information 2015 [place], [place]. This information is not intended to replace advice given to you by your health care provider. Make sure you discuss any questions you have with your health care provider.

## 2014-09-09 NOTE — Progress Notes (Signed)
Subjective:     Sonya [name] is a 54 y.o. female and is here for a comprehensive physical exam. The patient reports problems - insomnia worsening over last 10 months.  she has tried [place] , [place], valarian root and melatonin with no good results.  .  She states she can not turn her mind off.   She periodically gets inflammation in foot.  5 years ago she got a thorn in her foot while working in garden .  A dermatologist took it out but it periodically flares.   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    History   Social History  . Marital Status: Married    Spouse Name: N/A  . Number of Children: N/A  . Years of Education: N/A   Occupational History  . Not on file.   Social History Main Topics  . Smoking status: Never Smoker   . Smokeless tobacco: Not on file  . Alcohol Use: Not on file  . Drug Use: Not on file  . Sexual Activity: Not on file   Other Topics Concern  . Not on file   Social History Narrative   Health Maintenance  Topic Date Due  . HIV Screening  01/26/1976  . PAP SMEAR  01/26/1979  . COLONOSCOPY  01/26/2011  . INFLUENZA VACCINE  11/09/2014 (Originally 09/05/2014)  . MAMMOGRAM  11/23/2015  . TETANUS/TDAP  02/04/2021    The following portions of the patient's history were reviewed and updated as appropriate:  She  has a past medical history of Hypertension; Fibromyalgia; and Arthritis. She  does not have a problem list on file. She  has past surgical history that includes Cesarean section (1987). Her family history includes Arthritis in her father and mother; Diabetes in her mother; Heart failure in her father; Hyperlipidemia in her father and mother; Hypertension in her father and mother; Lung cancer in her paternal grandfather; Prostate cancer in her father; Stomach cancer in her maternal grandmother; Stroke in her father. She  reports that she has never smoked. She does not have any smokeless  tobacco history on file. Her alcohol and drug histories are not on file. She has a current medication list which includes the following prescription(s): alprazolam, vitamin c, cyclobenzaprine, diclofenac sodium, eszopiclone, glucosamine 1500 complex, hydrochlorothiazide, ibuprofen, meclizine, meloxicam, omeprazole, oxycodone-acetaminophen, sumatriptan, curcumin extract, and melatonin. Current Outpatient Prescriptions on File Prior to Visit  Medication Sig Dispense Refill  . hydrochlorothiazide (HYDRODIURIL) 25 MG tablet Take 25 mg by mouth daily.    Marland Kitchen ibuprofen (ADVIL,MOTRIN) 600 MG tablet Take 1 tablet (600 mg total) by mouth every 6 (six) hours as needed. 30 tablet 0  . oxyCODONE-acetaminophen (PERCOCET/ROXICET) 5-325 MG per tablet Take 1 tablet by mouth every 6 (six) hours as needed for moderate pain or severe pain. 15 tablet 0   No current facility-administered medications on file prior to visit.   She is allergic to atorvastatin..  Review of Systems Review of Systems  Constitutional: Negative for activity change, appetite change and fatigue.  HENT: Negative for hearing loss, congestion, tinnitus and ear discharge.  dentist q13m Eyes: Negative for visual disturbance (see optho q1y -- vision corrected to 20/20 with glasses).  Respiratory: Negative for cough, chest tightness and shortness of breath.   Cardiovascular: Negative for chest pain, palpitations and leg swelling.  Gastrointestinal: Negative for abdominal pain, diarrhea, constipation and abdominal distention.  Genitourinary: Negative for urgency, frequency, decreased urine  volume and difficulty urinating.  Musculoskeletal: + hand and knee pain  Skin: Negative for color change, pallor and rash.  Neurological: Negative for dizziness, light-headedness, numbness and headaches.  Hematological: Negative for adenopathy. Does not bruise/bleed easily.  Psychiatric/Behavioral: Negative for suicidal ideas, confusion,  self-injury, dysphoric  mood, decreased concentration and agitation--- + insomnia.       Objective:    BP 130/80 mmHg  Pulse 72  Temp(Src) 98.4 F (36.9 C) (Oral)  Ht 5\' 5"  (1.651 m)  Wt 195 lb 12.8 oz (88.814 kg)  BMI 32.58 kg/m2  SpO2 98%  LMP 08/31/2014 General appearance: alert, cooperative, appears stated age and no distress Head: Normocephalic, without obvious abnormality, atraumatic Eyes: conjunctivae/corneas clear. PERRL, EOM's intact. Fundi benign. Ears: normal TM's and external ear canals both ears Nose: Nares normal. Septum midline. Mucosa normal. No drainage or sinus tenderness. Throat: lips, mucosa, and tongue normal; teeth and gums normal Neck: no adenopathy, no carotid bruit, no JVD, supple, symmetrical, trachea midline and thyroid not enlarged, symmetric, no tenderness/mass/nodules Back: symmetric, no curvature. ROM normal. No CVA tenderness. Lungs: clear to auscultation bilaterally Breasts: normal appearance, no masses or tenderness Heart: S1, S2 normal Abdomen: soft, non-tender; bowel sounds normal; no masses,  no organomegaly Pelvic: cervix normal in appearance, external genitalia normal, no adnexal masses or tenderness, no cervical motion tenderness, rectovaginal septum normal, uterus normal size, shape, and consistency, vagina normal without discharge and pap done, rectal ,==heme neg brown stool Extremities: extremities normal, atraumatic, no cyanosis or edema Pulses: 2+ and symmetric Skin: Skin color, texture, turgor normal. No rashes or lesions Lymph nodes: Cervical, supraclavicular, and axillary nodes normal. Neurologic: Alert and oriented X 3, normal strength and tone. Normal symmetric reflexes. Normal coordination and gait Psych-- no depression, no anxiety      Assessment:    Healthy female exam.     Plan:  ghm utd Check labs   See After Visit Summary for Counseling Recommendations     1. Preventative health care   - Basic metabolic panel - CBC with  Differential/Platelet - Hepatic function panel - Lipid panel - POCT urinalysis dipstick - TSH - Rheumatoid factor - ANA - Ambulatory referral to Gastroenterology  2. Arthritis   - Basic metabolic panel - CBC with Differential/Platelet - Hepatic function panel - Lipid panel - POCT urinalysis dipstick - TSH - Rheumatoid factor - ANA - oxyCODONE-acetaminophen (PERCOCET/ROXICET) 5-325 MG per tablet; Take 1 tablet by mouth every 6 (six) hours as needed for moderate pain or severe pain.  Dispense: 15 tablet; Refill: 0  3. Insomnia   - Suvorexant (BELSOMRA) 10 MG TABS; Take 10 tablets by mouth at bedtime as needed.  Dispense: 30 tablet; Refill: 0  4. Generalized anxiety disorder   - ALPRAZolam (XANAX) 0.5 MG tablet; Take 1 tablet (0.5 mg total) by mouth 3 (three) times daily as needed for anxiety.  Dispense: 30 tablet; Refill: 2  5. Gastroesophageal reflux disease, esophagitis presence not specified   - omeprazole (PRILOSEC) 20 MG capsule; Take 1 capsule (20 mg total) by mouth daily.  Dispense: 90 capsule; Refill: 3  6. Essential hypertension   - hydrochlorothiazide (HYDRODIURIL) 25 MG tablet; Take 1 tablet (25 mg total) by mouth daily.  Dispense: 90 tablet; Refill: 1  7. Fibromyalgia   - cyclobenzaprine (FLEXERIL) 5 MG tablet; Take 1 tablet (5 mg total) by mouth 3 (three) times daily as needed for muscle spasms.  Dispense: 30 tablet; Refill: 2  8. Screening for malignant neoplasm of cervix    -  Cytology - PAP

## 2014-09-09 NOTE — Progress Notes (Signed)
Pre visit review using our clinic review tool, if applicable. No additional management support is needed unless otherwise documented below in the visit note. 

## 2014-09-12 LAB — ANA: Anti Nuclear Antibody(ANA): NEGATIVE

## 2014-09-13 LAB — CBC WITH DIFFERENTIAL/PLATELET

## 2014-09-13 LAB — CYTOLOGY - PAP

## 2014-09-22 ENCOUNTER — Telehealth: Payer: Self-pay | Admitting: Family Medicine

## 2014-09-22 NOTE — Telephone Encounter (Signed)
°  Relation to FU:XNAT Call back number:[phone] Pharmacy:[facility]- [address]  Reason for call: pt is needed PA on her rx Suvorexant (BELSOMRA) 10 MG TABS , also states dr. Etter Sjogren had informed her to double up on it if it did not work states it does not work taking one so she had been taking 2 without the PA it is very expensive.  States she will have the pharmacy fax over the form

## 2014-10-03 NOTE — Telephone Encounter (Signed)
Pt called back in to check the status of her prescription PA. Pt says that she is currently out of her meds.  Please call back to advise pt further.    CB#: [phone].

## 2014-10-03 NOTE — Telephone Encounter (Signed)
Not sure who is covering, please advise    KP

## 2014-10-04 NOTE — Telephone Encounter (Signed)
[place], PA is still processing. JG//CMA

## 2014-10-05 NOTE — Telephone Encounter (Signed)
PA approved effective until 10/04/2015. File ID: [id number]. Approval letter sent for scanning. JG/CMA

## 2014-10-11 ENCOUNTER — Telehealth: Payer: Self-pay | Admitting: Family Medicine

## 2014-10-11 NOTE — Telephone Encounter (Signed)
Refill x1---  5 refills 

## 2014-10-11 NOTE — Telephone Encounter (Signed)
Belsomra requested Last seen and filled 09/09/14 #30   Please advise    KP

## 2014-10-11 NOTE — Telephone Encounter (Signed)
Can we try 15 mg belsomra

## 2014-10-11 NOTE — Telephone Encounter (Signed)
Please advise      KP 

## 2014-10-11 NOTE — Telephone Encounter (Signed)
Patient called pharmacy and they do not have an rx on file for her.  Please a 30 day rx for belsomria to [address]

## 2014-10-11 NOTE — Telephone Encounter (Signed)
Pt feels like the Belsomra is working. At least not with 1/day. I advised pt to call pharmacy since PA was approved. She said that you also discussed Klonopin. Could she give that a try. Best # [phone].

## 2014-10-12 NOTE — Telephone Encounter (Signed)
I called the pharmacy and corrected the RX to 15 mg instead of 10 mg with the same directions. MSG left for the patient to return my call.       KP

## 2014-10-12 NOTE — Telephone Encounter (Signed)
Belsomra called in to the [place].      KP

## 2014-10-13 ENCOUNTER — Encounter: Payer: Self-pay | Admitting: Gastroenterology

## 2014-10-13 NOTE — Telephone Encounter (Signed)
Klonopin 0.5 mg #30  1 po qhs prn insomnia

## 2014-10-13 NOTE — Telephone Encounter (Signed)
She said she tried the 15 mg last night and it has not helped. She said she does not know what to do. She said she was given the option to do either the Klonopin or the Belsomra and since this does not work she is willing to try the other.  Please advise    KP

## 2014-10-14 MED ORDER — CLONAZEPAM 0.5 MG PO TABS: 0.5000 mg | ORAL_TABLET | Freq: Every day | ORAL | Status: DC

## 2014-10-14 NOTE — Addendum Note (Signed)
Addended by: Ewing Schlein on: 10/14/2014 09:51 AM   Modules accepted: Orders

## 2014-10-14 NOTE — Telephone Encounter (Signed)
Patient aware the Rx is being faxed.     KP

## 2014-10-17 NOTE — Telephone Encounter (Signed)
Pt feeling more relaxed but not sleeping yet.

## 2014-10-18 NOTE — Telephone Encounter (Signed)
Please advised any further recommendations.

## 2014-10-21 NOTE — Telephone Encounter (Signed)
Pt calling back to see if any changes are needed bc she is not sleeping.

## 2014-10-21 NOTE — Telephone Encounter (Signed)
I will defer to pt's PCP upon her return.

## 2014-10-21 NOTE — Telephone Encounter (Signed)
This patient is not sleeping, Dr.Lowne has given her Belsomra, she has tried [place], given xanax and Klonopin and says she is still not sleeping. Please advise     KP

## 2014-10-22 NOTE — Telephone Encounter (Signed)
Refer to neuro-- Dr Annett Gula for insomnia

## 2014-11-30 ENCOUNTER — Ambulatory Visit (INDEPENDENT_AMBULATORY_CARE_PROVIDER_SITE_OTHER): Payer: 59 | Admitting: Behavioral Health

## 2014-11-30 DIAGNOSIS — Z23 Encounter for immunization: Secondary | ICD-10-CM | POA: Diagnosis not present

## 2014-12-08 ENCOUNTER — Institutional Professional Consult (permissible substitution): Payer: 59 | Admitting: Pulmonary Disease

## 2015-01-03 ENCOUNTER — Other Ambulatory Visit: Payer: Self-pay

## 2015-01-03 DIAGNOSIS — Z1231 Encounter for screening mammogram for malignant neoplasm of breast: Secondary | ICD-10-CM

## 2015-01-20 ENCOUNTER — Ambulatory Visit
Admission: RE | Admit: 2015-01-20 | Discharge: 2015-01-20 | Disposition: A | Discharge status: Home / Self Care | Payer: 59 | Source: Ambulatory Visit

## 2015-01-20 DIAGNOSIS — Z1231 Encounter for screening mammogram for malignant neoplasm of breast: Secondary | ICD-10-CM

## 2015-02-17 ENCOUNTER — Other Ambulatory Visit: Payer: Self-pay | Admitting: Family Medicine

## 2015-02-17 ENCOUNTER — Telehealth: Payer: Self-pay | Admitting: Family Medicine

## 2015-02-17 DIAGNOSIS — M1990 Unspecified osteoarthritis, unspecified site: Secondary | ICD-10-CM

## 2015-02-17 DIAGNOSIS — F411 Generalized anxiety disorder: Secondary | ICD-10-CM

## 2015-02-17 MED ORDER — HYDROXYZINE PAMOATE 50 MG PO CAPS: 50.0000 mg | ORAL_CAPSULE | Freq: Every day | ORAL | Status: DC

## 2015-02-17 NOTE — Telephone Encounter (Addendum)
Patient said she is doing the 24 of Belsomra and it is not working, she wanted to know if there was something else that she could take. She also thinks it could possible be hormones because she can't sleep most of the time the week before her menstrual cycle. Discussed with edward. Hydroxyzine 50 1 po qhs #3 and call on Monday with the report.      KP

## 2015-02-17 NOTE — Telephone Encounter (Signed)
Pt calling about insomnia stating Belsomra doesn't seem to help either. Requesting call back at [phone].

## 2015-02-18 NOTE — Telephone Encounter (Signed)
D/c belsomra If hormonal--- prozac 10 mg daily may help #30  2 refills

## 2015-02-22 ENCOUNTER — Telehealth: Payer: Self-pay | Admitting: Family Medicine

## 2015-02-22 NOTE — Telephone Encounter (Signed)
Message left to call the office.    KP 

## 2015-02-22 NOTE — Telephone Encounter (Signed)
Pt returned your call.    CB: [phone]

## 2015-02-22 NOTE — Telephone Encounter (Signed)
error 

## 2015-02-24 MED ORDER — DICLOFENAC SODIUM 1 % TD GEL: 1.0000 "application " | Freq: Four times a day (QID) | TRANSDERMAL | Status: DC | PRN

## 2015-02-24 MED ORDER — ALPRAZOLAM 0.5 MG PO TABS: 0.5000 mg | ORAL_TABLET | Freq: Three times a day (TID) | ORAL | Status: DC | PRN

## 2015-02-24 MED ORDER — FLUOXETINE HCL 10 MG PO CAPS: 10.0000 mg | ORAL_CAPSULE | Freq: Every day | ORAL | Status: DC

## 2015-02-24 MED ORDER — OXYCODONE-ACETAMINOPHEN 5-325 MG PO TABS: 1.0000 | ORAL_TABLET | Freq: Four times a day (QID) | ORAL | Status: DC | PRN

## 2015-02-24 NOTE — Telephone Encounter (Signed)
REFILL ALL X1

## 2015-02-24 NOTE — Telephone Encounter (Signed)
Patient has agreed to taking Prozac, Rx faxed  She is requesting Oxycodone filled 09/09/14 #15 and Xanax 09/09/14 #30 with 2 refills.  She says her Insomnia is coming from the pain  Please advise     KP

## 2015-02-24 NOTE — Telephone Encounter (Signed)
Patient was aware that the medication will be ready on Monday

## 2015-04-11 ENCOUNTER — Other Ambulatory Visit: Payer: Self-pay | Admitting: Family Medicine

## 2015-04-25 ENCOUNTER — Other Ambulatory Visit: Payer: Self-pay | Admitting: Family Medicine

## 2015-04-25 NOTE — Telephone Encounter (Signed)
Last seen 09/09/14 and filled Xanax 02/24/15 #30 with 3 refills   Please advise   KP

## 2015-04-26 NOTE — Telephone Encounter (Signed)
VM left at the pharmacy for Rx.    KP

## 2015-09-06 ENCOUNTER — Other Ambulatory Visit: Payer: Self-pay | Admitting: Family Medicine

## 2015-09-11 ENCOUNTER — Encounter: Payer: Self-pay | Admitting: Family Medicine

## 2015-09-11 ENCOUNTER — Ambulatory Visit (HOSPITAL_BASED_OUTPATIENT_CLINIC_OR_DEPARTMENT_OTHER)
Admission: RE | Admit: 2015-09-11 | Discharge: 2015-09-11 | Disposition: A | Discharge status: Home / Self Care | Payer: 59 | Source: Ambulatory Visit | Attending: Family Medicine | Admitting: Family Medicine

## 2015-09-11 ENCOUNTER — Ambulatory Visit (INDEPENDENT_AMBULATORY_CARE_PROVIDER_SITE_OTHER): Payer: 59 | Admitting: Family Medicine

## 2015-09-11 VITALS — BP 134/78 | HR 62 | Temp 98.2°F | Resp 16 | Ht 64.0 in | Wt 159.0 lb

## 2015-09-11 DIAGNOSIS — Z0000 Encounter for general adult medical examination without abnormal findings: Secondary | ICD-10-CM

## 2015-09-11 DIAGNOSIS — Z1159 Encounter for screening for other viral diseases: Secondary | ICD-10-CM

## 2015-09-11 DIAGNOSIS — M1990 Unspecified osteoarthritis, unspecified site: Secondary | ICD-10-CM | POA: Diagnosis not present

## 2015-09-11 DIAGNOSIS — F411 Generalized anxiety disorder: Secondary | ICD-10-CM

## 2015-09-11 LAB — RHEUMATOID FACTOR: Rhuematoid fact SerPl-aCnc: <10 IU/mL (ref <=14)

## 2015-09-11 MED ORDER — OXYCODONE-ACETAMINOPHEN 5-325 MG PO TABS: 1.0000 | ORAL_TABLET | Freq: Four times a day (QID) | ORAL | 0 refills | Status: DC | PRN

## 2015-09-11 MED ORDER — ALPRAZOLAM 0.5 MG PO TABS: 0.5000 mg | ORAL_TABLET | Freq: Three times a day (TID) | ORAL | 2 refills | Status: DC | PRN

## 2015-09-11 NOTE — Progress Notes (Signed)
Pre visit review using our clinic review tool, if applicable. No additional management support is needed unless otherwise documented below in the visit note. 

## 2015-09-11 NOTE — Patient Instructions (Signed)
Preventive Care for Adults, Female A healthy lifestyle and preventive care can promote health and wellness. Preventive health guidelines for women include the following key practices.  A routine yearly physical is a good way to check with your health care provider about your health and preventive screening. It is a chance to share any concerns and updates on your health and to receive a thorough exam.  Visit your dentist for a routine exam and preventive care every 6 months. Brush your teeth twice a day and floss once a day. Good oral hygiene prevents tooth decay and gum disease.  The frequency of eye exams is based on your age, health, family medical history, use of contact lenses, and other factors. Follow your health care provider's recommendations for frequency of eye exams.  Eat a healthy diet. Foods like vegetables, fruits, whole grains, low-fat dairy products, and lean protein foods contain the nutrients you need without too many calories. Decrease your intake of foods high in solid fats, added sugars, and salt. Eat the right amount of calories for you.Get information about a proper diet from your health care provider, if necessary.  Regular physical exercise is one of the most important things you can do for your health. Most adults should get at least 150 minutes of moderate-intensity exercise (any activity that increases your heart rate and causes you to sweat) each week. In addition, most adults need muscle-strengthening exercises on 2 or more days a week.  Maintain a healthy weight. The body mass index (BMI) is a screening tool to identify possible weight problems. It provides an estimate of body fat based on height and weight. Your health care provider can find your BMI and can help you achieve or maintain a healthy weight.For adults 20 years and older:  A BMI below 18.5 is considered underweight.  A BMI of 18.5 to 24.9 is normal.  A BMI of 25 to 29.9 is considered overweight.  A  BMI of 30 and above is considered obese.  Maintain normal blood lipids and cholesterol levels by exercising and minimizing your intake of saturated fat. Eat a balanced diet with plenty of fruit and vegetables. Blood tests for lipids and cholesterol should begin at age 45 and be repeated every 5 years. If your lipid or cholesterol levels are high, you are over 50, or you are at high risk for heart disease, you may need your cholesterol levels checked more frequently.Ongoing high lipid and cholesterol levels should be treated with medicines if diet and exercise are not working.  If you smoke, find out from your health care provider how to quit. If you do not use tobacco, do not start.  Lung cancer screening is recommended for adults aged 45-80 years who are at high risk for developing lung cancer because of a history of smoking. A yearly low-dose CT scan of the lungs is recommended for people who have at least a 30-pack-year history of smoking and are a current smoker or have quit within the past 15 years. A pack year of smoking is smoking an average of 1 pack of cigarettes a day for 1 year (for example: 1 pack a day for 30 years or 2 packs a day for 15 years). Yearly screening should continue until the smoker has stopped smoking for at least 15 years. Yearly screening should be stopped for people who develop a health problem that would prevent them from having lung cancer treatment.  If you are pregnant, do not drink alcohol. If you are  breastfeeding, be very cautious about drinking alcohol. If you are not pregnant and choose to drink alcohol, do not have more than 1 drink per day. One drink is considered to be 12 ounces (355 mL) of beer, 5 ounces (148 mL) of wine, or 1.5 ounces (44 mL) of liquor.  Avoid use of street drugs. Do not share needles with anyone. Ask for help if you need support or instructions about stopping the use of drugs.  High blood pressure causes heart disease and increases the risk  of stroke. Your blood pressure should be checked at least every 1 to 2 years. Ongoing high blood pressure should be treated with medicines if weight loss and exercise do not work.  If you are 55-79 years old, ask your health care provider if you should take aspirin to prevent strokes.  Diabetes screening is done by taking a blood sample to check your blood glucose level after you have not eaten for a certain period of time (fasting). If you are not overweight and you do not have risk factors for diabetes, you should be screened once every 3 years starting at age 45. If you are overweight or obese and you are 40-70 years of age, you should be screened for diabetes every year as part of your cardiovascular risk assessment.  Breast cancer screening is essential preventive care for women. You should practice "breast self-awareness." This means understanding the normal appearance and feel of your breasts and may include breast self-examination. Any changes detected, no matter how small, should be reported to a health care provider. Women in their 20s and 30s should have a clinical breast exam (CBE) by a health care provider as part of a regular health exam every 1 to 3 years. After age 40, women should have a CBE every year. Starting at age 40, women should consider having a mammogram (breast X-ray test) every year. Women who have a family history of breast cancer should talk to their health care provider about genetic screening. Women at a high risk of breast cancer should talk to their health care providers about having an MRI and a mammogram every year.  Breast cancer gene (BRCA)-related cancer risk assessment is recommended for women who have family members with BRCA-related cancers. BRCA-related cancers include breast, ovarian, tubal, and peritoneal cancers. Having family members with these cancers may be associated with an increased risk for harmful changes (mutations) in the breast cancer genes BRCA1 and  BRCA2. Results of the assessment will determine the need for genetic counseling and BRCA1 and BRCA2 testing.  Your health care provider may recommend that you be screened regularly for cancer of the pelvic organs (ovaries, uterus, and vagina). This screening involves a pelvic examination, including checking for microscopic changes to the surface of your cervix (Pap test). You may be encouraged to have this screening done every 3 years, beginning at age 21.  For women ages 30-65, health care providers may recommend pelvic exams and Pap testing every 3 years, or they may recommend the Pap and pelvic exam, combined with testing for human papilloma virus (HPV), every 5 years. Some types of HPV increase your risk of cervical cancer. Testing for HPV may also be done on women of any age with unclear Pap test results.  Other health care providers may not recommend any screening for nonpregnant women who are considered low risk for pelvic cancer and who do not have symptoms. Ask your health care provider if a screening pelvic exam is right for   you.  If you have had past treatment for cervical cancer or a condition that could lead to cancer, you need Pap tests and screening for cancer for at least 20 years after your treatment. If Pap tests have been discontinued, your risk factors (such as having a new sexual partner) need to be reassessed to determine if screening should resume. Some women have medical problems that increase the chance of getting cervical cancer. In these cases, your health care provider may recommend more frequent screening and Pap tests.  Colorectal cancer can be detected and often prevented. Most routine colorectal cancer screening begins at the age of 50 years and continues through age 75 years. However, your health care provider may recommend screening at an earlier age if you have risk factors for colon cancer. On a yearly basis, your health care provider may provide home test kits to check  for hidden blood in the stool. Use of a small camera at the end of a tube, to directly examine the colon (sigmoidoscopy or colonoscopy), can detect the earliest forms of colorectal cancer. Talk to your health care provider about this at age 50, when routine screening begins. Direct exam of the colon should be repeated every 5-10 years through age 75 years, unless early forms of precancerous polyps or small growths are found.  People who are at an increased risk for hepatitis B should be screened for this virus. You are considered at high risk for hepatitis B if:  You were born in a country where hepatitis B occurs often. Talk with your health care provider about which countries are considered high risk.  Your parents were born in a high-risk country and you have not received a shot to protect against hepatitis B (hepatitis B vaccine).  You have HIV or AIDS.  You use needles to inject street drugs.  You live with, or have sex with, someone who has hepatitis B.  You get hemodialysis treatment.  You take certain medicines for conditions like cancer, organ transplantation, and autoimmune conditions.  Hepatitis C blood testing is recommended for all people born from 1945 through 1965 and any individual with known risks for hepatitis C.  Practice safe sex. Use condoms and avoid high-risk sexual practices to reduce the spread of sexually transmitted infections (STIs). STIs include gonorrhea, chlamydia, syphilis, trichomonas, herpes, HPV, and human immunodeficiency virus (HIV). Herpes, HIV, and HPV are viral illnesses that have no cure. They can result in disability, cancer, and death.  You should be screened for sexually transmitted illnesses (STIs) including gonorrhea and chlamydia if:  You are sexually active and are younger than 24 years.  You are older than 24 years and your health care provider tells you that you are at risk for this type of infection.  Your sexual activity has changed  since you were last screened and you are at an increased risk for chlamydia or gonorrhea. Ask your health care provider if you are at risk.  If you are at risk of being infected with HIV, it is recommended that you take a prescription medicine daily to prevent HIV infection. This is called preexposure prophylaxis (PrEP). You are considered at risk if:  You are sexually active and do not regularly use condoms or know the HIV status of your partner(s).  You take drugs by injection.  You are sexually active with a partner who has HIV.  Talk with your health care provider about whether you are at high risk of being infected with HIV. If   you choose to begin PrEP, you should first be tested for HIV. You should then be tested every 3 months for as long as you are taking PrEP.  Osteoporosis is a disease in which the bones lose minerals and strength with aging. This can result in serious bone fractures or breaks. The risk of osteoporosis can be identified using a bone density scan. Women ages 67 years and over and women at risk for fractures or osteoporosis should discuss screening with their health care providers. Ask your health care provider whether you should take a calcium supplement or vitamin D to reduce the rate of osteoporosis.  Menopause can be associated with physical symptoms and risks. Hormone replacement therapy is available to decrease symptoms and risks. You should talk to your health care provider about whether hormone replacement therapy is right for you.  Use sunscreen. Apply sunscreen liberally and repeatedly throughout the day. You should seek shade when your shadow is shorter than you. Protect yourself by wearing long sleeves, pants, a wide-brimmed hat, and sunglasses year round, whenever you are outdoors.  Once a month, do a whole body skin exam, using a mirror to look at the skin on your back. Tell your health care provider of new moles, moles that have irregular borders, moles that  are larger than a pencil eraser, or moles that have changed in shape or color.  Stay current with required vaccines (immunizations).  Influenza vaccine. All adults should be immunized every year.  Tetanus, diphtheria, and acellular pertussis (Td, Tdap) vaccine. Pregnant women should receive 1 dose of Tdap vaccine during each pregnancy. The dose should be obtained regardless of the length of time since the last dose. Immunization is preferred during the 27th-36th week of gestation. An adult who has not previously received Tdap or who does not know her vaccine status should receive 1 dose of Tdap. This initial dose should be followed by tetanus and diphtheria toxoids (Td) booster doses every 10 years. Adults with an unknown or incomplete history of completing a 3-dose immunization series with Td-containing vaccines should begin or complete a primary immunization series including a Tdap dose. Adults should receive a Td booster every 10 years.  Varicella vaccine. An adult without evidence of immunity to varicella should receive 2 doses or a second dose if she has previously received 1 dose. Pregnant females who do not have evidence of immunity should receive the first dose after pregnancy. This first dose should be obtained before leaving the health care facility. The second dose should be obtained 4-8 weeks after the first dose.  Human papillomavirus (HPV) vaccine. Females aged 13-26 years who have not received the vaccine previously should obtain the 3-dose series. The vaccine is not recommended for use in pregnant females. However, pregnancy testing is not needed before receiving a dose. If a female is found to be pregnant after receiving a dose, no treatment is needed. In that case, the remaining doses should be delayed until after the pregnancy. Immunization is recommended for any person with an immunocompromised condition through the age of 61 years if she did not get any or all doses earlier. During the  3-dose series, the second dose should be obtained 4-8 weeks after the first dose. The third dose should be obtained 24 weeks after the first dose and 16 weeks after the second dose.  Zoster vaccine. One dose is recommended for adults aged 30 years or older unless certain conditions are present.  Measles, mumps, and rubella (MMR) vaccine. Adults born  before 1957 generally are considered immune to measles and mumps. Adults born in 1957 or later should have 1 or more doses of MMR vaccine unless there is a contraindication to the vaccine or there is laboratory evidence of immunity to each of the three diseases. A routine second dose of MMR vaccine should be obtained at least 28 days after the first dose for students attending postsecondary schools, health care workers, or international travelers. People who received inactivated measles vaccine or an unknown type of measles vaccine during 1963-1967 should receive 2 doses of MMR vaccine. People who received inactivated mumps vaccine or an unknown type of mumps vaccine before 1979 and are at high risk for mumps infection should consider immunization with 2 doses of MMR vaccine. For females of childbearing age, rubella immunity should be determined. If there is no evidence of immunity, females who are not pregnant should be vaccinated. If there is no evidence of immunity, females who are pregnant should delay immunization until after pregnancy. Unvaccinated health care workers born before 1957 who lack laboratory evidence of measles, mumps, or rubella immunity or laboratory confirmation of disease should consider measles and mumps immunization with 2 doses of MMR vaccine or rubella immunization with 1 dose of MMR vaccine.  Pneumococcal 13-valent conjugate (PCV13) vaccine. When indicated, a person who is uncertain of his immunization history and has no record of immunization should receive the PCV13 vaccine. All adults 65 years of age and older should receive this  vaccine. An adult aged 19 years or older who has certain medical conditions and has not been previously immunized should receive 1 dose of PCV13 vaccine. This PCV13 should be followed with a dose of pneumococcal polysaccharide (PPSV23) vaccine. Adults who are at high risk for pneumococcal disease should obtain the PPSV23 vaccine at least 8 weeks after the dose of PCV13 vaccine. Adults older than 55 years of age who have normal immune system function should obtain the PPSV23 vaccine dose at least 1 year after the dose of PCV13 vaccine.  Pneumococcal polysaccharide (PPSV23) vaccine. When PCV13 is also indicated, PCV13 should be obtained first. All adults aged 65 years and older should be immunized. An adult younger than age 65 years who has certain medical conditions should be immunized. Any person who resides in a nursing home or long-term care facility should be immunized. An adult smoker should be immunized. People with an immunocompromised condition and certain other conditions should receive both PCV13 and PPSV23 vaccines. People with human immunodeficiency virus (HIV) infection should be immunized as soon as possible after diagnosis. Immunization during chemotherapy or radiation therapy should be avoided. Routine use of PPSV23 vaccine is not recommended for American Indians, [place] Natives, or people younger than 65 years unless there are medical conditions that require PPSV23 vaccine. When indicated, people who have unknown immunization and have no record of immunization should receive PPSV23 vaccine. One-time revaccination 5 years after the first dose of PPSV23 is recommended for people aged 19-64 years who have chronic kidney failure, nephrotic syndrome, asplenia, or immunocompromised conditions. People who received 1-2 doses of PPSV23 before age 65 years should receive another dose of PPSV23 vaccine at age 65 years or later if at least 5 years have passed since the previous dose. Doses of PPSV23 are not  needed for people immunized with PPSV23 at or after age 65 years.  Meningococcal vaccine. Adults with asplenia or persistent complement component deficiencies should receive 2 doses of quadrivalent meningococcal conjugate (MenACWY-D) vaccine. The doses should be obtained   at least 2 months apart. Microbiologists working with certain meningococcal bacteria, [place] recruits, people at risk during an outbreak, and people who travel to or live in countries with a high rate of meningitis should be immunized. A first-year college student up through age 34 years who is living in a residence hall should receive a dose if she did not receive a dose on or after her 16th birthday. Adults who have certain high-risk conditions should receive one or more doses of vaccine.  Hepatitis A vaccine. Adults who wish to be protected from this disease, have certain high-risk conditions, work with hepatitis A-infected animals, work in hepatitis A research labs, or travel to or work in countries with a high rate of hepatitis A should be immunized. Adults who were previously unvaccinated and who anticipate close contact with an international adoptee during the first 60 days after arrival in the [place] from a country with a high rate of hepatitis A should be immunized.  Hepatitis B vaccine. Adults who wish to be protected from this disease, have certain high-risk conditions, may be exposed to blood or other infectious body fluids, are household contacts or sex partners of hepatitis B positive people, are clients or workers in certain care facilities, or travel to or work in countries with a high rate of hepatitis B should be immunized.  Haemophilus influenzae type b (Hib) vaccine. A previously unvaccinated person with asplenia or sickle cell disease or having a scheduled splenectomy should receive 1 dose of Hib vaccine. Regardless of previous immunization, a recipient of a hematopoietic stem cell transplant should receive a  3-dose series 6-12 months after her successful transplant. Hib vaccine is not recommended for adults with HIV infection. Preventive Services / Frequency Ages 35 to 4 years  Blood pressure check.** / Every 3-5 years.  Lipid and cholesterol check.** / Every 5 years beginning at age 60.  Clinical breast exam.** / Every 3 years for women in their 71s and 10s.  BRCA-related cancer risk assessment.** / For women who have family members with a BRCA-related cancer (breast, ovarian, tubal, or peritoneal cancers).  Pap test.** / Every 2 years from ages 76 through 26. Every 3 years starting at age 61 through age 76 or 93 with a history of 3 consecutive normal Pap tests.  HPV screening.** / Every 3 years from ages 37 through ages 60 to 51 with a history of 3 consecutive normal Pap tests.  Hepatitis C blood test.** / For any individual with known risks for hepatitis C.  Skin self-exam. / Monthly.  Influenza vaccine. / Every year.  Tetanus, diphtheria, and acellular pertussis (Tdap, Td) vaccine.** / Consult your health care provider. Pregnant women should receive 1 dose of Tdap vaccine during each pregnancy. 1 dose of Td every 10 years.  Varicella vaccine.** / Consult your health care provider. Pregnant females who do not have evidence of immunity should receive the first dose after pregnancy.  HPV vaccine. / 3 doses over 6 months, if 93 and younger. The vaccine is not recommended for use in pregnant females. However, pregnancy testing is not needed before receiving a dose.  Measles, mumps, rubella (MMR) vaccine.** / You need at least 1 dose of MMR if you were born in 1957 or later. You may also need a 2nd dose. For females of childbearing age, rubella immunity should be determined. If there is no evidence of immunity, females who are not pregnant should be vaccinated. If there is no evidence of immunity, females who are  pregnant should delay immunization until after pregnancy.  Pneumococcal  13-valent conjugate (PCV13) vaccine.** / Consult your health care provider.  Pneumococcal polysaccharide (PPSV23) vaccine.** / 1 to 2 doses if you smoke cigarettes or if you have certain conditions.  Meningococcal vaccine.** / 1 dose if you are age 68 to 8 years and a Market researcher living in a residence hall, or have one of several medical conditions, you need to get vaccinated against meningococcal disease. You may also need additional booster doses.  Hepatitis A vaccine.** / Consult your health care provider.  Hepatitis B vaccine.** / Consult your health care provider.  Haemophilus influenzae type b (Hib) vaccine.** / Consult your health care provider. Ages 7 to 53 years  Blood pressure check.** / Every year.  Lipid and cholesterol check.** / Every 5 years beginning at age 25 years.  Lung cancer screening. / Every year if you are aged 11-80 years and have a 30-pack-year history of smoking and currently smoke or have quit within the past 15 years. Yearly screening is stopped once you have quit smoking for at least 15 years or develop a health problem that would prevent you from having lung cancer treatment.  Clinical breast exam.** / Every year after age 48 years.  BRCA-related cancer risk assessment.** / For women who have family members with a BRCA-related cancer (breast, ovarian, tubal, or peritoneal cancers).  Mammogram.** / Every year beginning at age 41 years and continuing for as long as you are in good health. Consult with your health care provider.  Pap test.** / Every 3 years starting at age 65 years through age 37 or 70 years with a history of 3 consecutive normal Pap tests.  HPV screening.** / Every 3 years from ages 72 years through ages 60 to 40 years with a history of 3 consecutive normal Pap tests.  Fecal occult blood test (FOBT) of stool. / Every year beginning at age 21 years and continuing until age 5 years. You may not need to do this test if you get  a colonoscopy every 10 years.  Flexible sigmoidoscopy or colonoscopy.** / Every 5 years for a flexible sigmoidoscopy or every 10 years for a colonoscopy beginning at age 35 years and continuing until age 48 years.  Hepatitis C blood test.** / For all people born from [birth year] through 1965 and any individual with known risks for hepatitis C.  Skin self-exam. / Monthly.  Influenza vaccine. / Every year.  Tetanus, diphtheria, and acellular pertussis (Tdap/Td) vaccine.** / Consult your health care provider. Pregnant women should receive 1 dose of Tdap vaccine during each pregnancy. 1 dose of Td every 10 years.  Varicella vaccine.** / Consult your health care provider. Pregnant females who do not have evidence of immunity should receive the first dose after pregnancy.  Zoster vaccine.** / 1 dose for adults aged 30 years or older.  Measles, mumps, rubella (MMR) vaccine.** / You need at least 1 dose of MMR if you were born in 1957 or later. You may also need a second dose. For females of childbearing age, rubella immunity should be determined. If there is no evidence of immunity, females who are not pregnant should be vaccinated. If there is no evidence of immunity, females who are pregnant should delay immunization until after pregnancy.  Pneumococcal 13-valent conjugate (PCV13) vaccine.** / Consult your health care provider.  Pneumococcal polysaccharide (PPSV23) vaccine.** / 1 to 2 doses if you smoke cigarettes or if you have certain conditions.  Meningococcal vaccine.** /  Consult your health care provider.  Hepatitis A vaccine.** / Consult your health care provider.  Hepatitis B vaccine.** / Consult your health care provider.  Haemophilus influenzae type b (Hib) vaccine.** / Consult your health care provider. Ages 64 years and over  Blood pressure check.** / Every year.  Lipid and cholesterol check.** / Every 5 years beginning at age 23 years.  Lung cancer screening. / Every year if you  are aged 16-80 years and have a 30-pack-year history of smoking and currently smoke or have quit within the past 15 years. Yearly screening is stopped once you have quit smoking for at least 15 years or develop a health problem that would prevent you from having lung cancer treatment.  Clinical breast exam.** / Every year after age 74 years.  BRCA-related cancer risk assessment.** / For women who have family members with a BRCA-related cancer (breast, ovarian, tubal, or peritoneal cancers).  Mammogram.** / Every year beginning at age 44 years and continuing for as long as you are in good health. Consult with your health care provider.  Pap test.** / Every 3 years starting at age 58 years through age 22 or 39 years with 3 consecutive normal Pap tests. Testing can be stopped between 65 and 70 years with 3 consecutive normal Pap tests and no abnormal Pap or HPV tests in the past 10 years.  HPV screening.** / Every 3 years from ages 64 years through ages 70 or 61 years with a history of 3 consecutive normal Pap tests. Testing can be stopped between 65 and 70 years with 3 consecutive normal Pap tests and no abnormal Pap or HPV tests in the past 10 years.  Fecal occult blood test (FOBT) of stool. / Every year beginning at age 40 years and continuing until age 27 years. You may not need to do this test if you get a colonoscopy every 10 years.  Flexible sigmoidoscopy or colonoscopy.** / Every 5 years for a flexible sigmoidoscopy or every 10 years for a colonoscopy beginning at age 7 years and continuing until age 32 years.  Hepatitis C blood test.** / For all people born from [birth year] through 1965 and any individual with known risks for hepatitis C.  Osteoporosis screening.** / A one-time screening for women ages 30 years and over and women at risk for fractures or osteoporosis.  Skin self-exam. / Monthly.  Influenza vaccine. / Every year.  Tetanus, diphtheria, and acellular pertussis (Tdap/Td)  vaccine.** / 1 dose of Td every 10 years.  Varicella vaccine.** / Consult your health care provider.  Zoster vaccine.** / 1 dose for adults aged 35 years or older.  Pneumococcal 13-valent conjugate (PCV13) vaccine.** / Consult your health care provider.  Pneumococcal polysaccharide (PPSV23) vaccine.** / 1 dose for all adults aged 46 years and older.  Meningococcal vaccine.** / Consult your health care provider.  Hepatitis A vaccine.** / Consult your health care provider.  Hepatitis B vaccine.** / Consult your health care provider.  Haemophilus influenzae type b (Hib) vaccine.** / Consult your health care provider. ** Family history and personal history of risk and conditions may change your health care provider's recommendations.   This information is not intended to replace advice given to you by your health care provider. Make sure you discuss any questions you have with your health care provider.   Document Released: 03/19/2001 Document Revised: 02/11/2014 Document Reviewed: 06/18/2010 Elsevier Interactive Patient Education [employer].

## 2015-09-11 NOTE — Progress Notes (Signed)
Subjective:     Sonya [name] is a 55 y.o. female and is here for a comprehensive physical exam. The patient reports no problems.  Social History   Social History  . Marital status: Married    Spouse name: N/A  . Number of children: N/A  . Years of education: N/A   Occupational History  . Not on file.   Social History Main Topics  . Smoking status: Never Smoker  . Smokeless tobacco: Not on file  . Alcohol use Not on file  . Drug use: Unknown  . Sexual activity: Not on file   Other Topics Concern  . Not on file   Social History Narrative  . No narrative on file   Health Maintenance  Topic Date Due  . COLONOSCOPY  01/26/2011  . INFLUENZA VACCINE  09/05/2015  . Hepatitis C Screening  09/10/2016 (Originally Sep 14, 1960)  . HIV Screening  09/10/2016 (Originally 01/26/1976)  . MAMMOGRAM  01/19/2017  . PAP SMEAR  09/08/2017  . TETANUS/TDAP  02/04/2021    The following portions of the patient's history were reviewed and updated as appropriate: She  has a past medical history of Arthritis; Fibromyalgia; and Hypertension. She  does not have a problem list on file. She  has a past surgical history that includes Cesarean section (1987). Her family history includes Arthritis in her father and mother; Diabetes in her mother; Heart failure in her father; Hyperlipidemia in her father and mother; Hypertension in her father and mother; Lung cancer in her paternal grandfather; Prostate cancer in her father; Stomach cancer in her maternal grandmother; Stroke in her father. She  reports that she has never smoked. She does not have any smokeless tobacco history on file. Her alcohol and drug histories are not on file. She has a current medication list which includes the following prescription(s): alprazolam, vitamin c, belsomra, diclofenac sodium, glucosamine 1500 complex, hydrochlorothiazide, ibuprofen, meclizine, oxycodone-acetaminophen, sumatriptan, and curcumin extract. Current Outpatient  Prescriptions on File Prior to Visit  Medication Sig Dispense Refill  . ALPRAZolam (XANAX) 0.5 MG tablet TAKE 1 TABLET BY MOUTH 3 TIMES A DAY AS NEEDED 30 tablet 2  . Ascorbic Acid (VITAMIN C) 1000 MG tablet Take 1,000 mg by mouth daily.    . BELSOMRA 15 MG TABS TAKE 1 TABLET BY MOUTH EVERY DAY AT BEDTIME AS NEEDED 30 tablet 3  . diclofenac sodium (VOLTAREN) 1 % GEL Apply 1 application topically 4 (four) times daily as needed. 100 g 2  . Glucosamine-Chondroit-Vit C-Mn (GLUCOSAMINE 1500 COMPLEX) CAPS Take 1 capsule by mouth daily.    . hydrochlorothiazide (HYDRODIURIL) 25 MG tablet TAKE 1 TABLET (25 MG TOTAL) BY MOUTH DAILY. 90 tablet 0  . ibuprofen (ADVIL,MOTRIN) 600 MG tablet Take 1 tablet (600 mg total) by mouth every 6 (six) hours as needed. 30 tablet 0  . meclizine (ANTIVERT) 25 MG tablet Take 25 mg by mouth 3 (three) times daily as needed for dizziness.    Marland Kitchen oxyCODONE-acetaminophen (PERCOCET/ROXICET) 5-325 MG tablet Take 1 tablet by mouth every 6 (six) hours as needed for moderate pain or severe pain. 15 tablet 0  . SUMAtriptan (IMITREX) 50 MG tablet Take 50 mg by mouth as needed.    . Turmeric, Curcuma Longa, (CURCUMIN EXTRACT) POWD Take 48 mg by mouth daily.     No current facility-administered medications on file prior to visit.    She is allergic to atorvastatin..  Review of Systems Review of Systems  Constitutional: Negative for activity change, appetite change  and fatigue.  HENT: Negative for hearing loss, congestion, tinnitus and ear discharge.  dentist q78m Eyes: Negative for visual disturbance (see optho q1y -- vision corrected to 20/20 with glasses).  Respiratory: Negative for cough, chest tightness and shortness of breath.   Cardiovascular: Negative for chest pain, palpitations and leg swelling.  Gastrointestinal: Negative for abdominal pain, diarrhea, constipation and abdominal distention.  Genitourinary: Negative for urgency, frequency, decreased urine volume and difficulty  urinating.  Musculoskeletal: Negative for back pain, arthralgias and gait problem.  Skin: Negative for color change, pallor and rash.  Neurological: Negative for dizziness, light-headedness, numbness and headaches.  Hematological: Negative for adenopathy. Does not bruise/bleed easily.  Psychiatric/Behavioral: Negative for suicidal ideas, confusion, sleep disturbance, self-injury, dysphoric mood, decreased concentration and agitation.       Objective:    BP 134/78 (BP Location: Left Arm, Patient Position: Sitting, Cuff Size: Normal)   Pulse 62   Temp 98.2 F (36.8 C) (Oral)   Resp 16   Ht 5\' 4"  (1.626 m)   Wt 159 lb (72.1 kg)   LMP 08/31/2015 (Exact Date)   SpO2 97%   BMI 27.29 kg/m  General appearance: alert, cooperative, appears stated age and no distress Head: Normocephalic, without obvious abnormality, atraumatic Eyes: conjunctivae/corneas clear. PERRL, EOM's intact. Fundi benign. Ears: normal TM's and external ear canals both ears Nose: Nares normal. Septum midline. Mucosa normal. No drainage or sinus tenderness. Throat: lips, mucosa, and tongue normal; teeth and gums normal Neck: no adenopathy, no carotid bruit, no JVD, supple, symmetrical, trachea midline and thyroid not enlarged, symmetric, no tenderness/mass/nodules Back: symmetric, no curvature. ROM normal. No CVA tenderness. Lungs: clear to auscultation bilaterally Breasts: normal appearance, no masses or tenderness Heart: regular rate and rhythm, S1, S2 normal, no murmur, click, rub or gallop Abdomen: soft, non-tender; bowel sounds normal; no masses,  no organomegaly Pelvic: deferred Extremities: extremities normal, atraumatic, no cyanosis or edema Pulses: 2+ and symmetric Skin: Skin color, texture, turgor normal. No rashes or lesions Lymph nodes: Cervical, supraclavicular, and axillary nodes normal. Neurologic: Alert and oriented X 3, normal strength and tone. Normal symmetric reflexes. Normal coordination and  gait    Assessment:    Healthy female exam.      Plan:    ghm utd Check labs See After Visit Summary for Counseling Recommendations    1. Preventative health care See above - Ambulatory referral to Gastroenterology  2. Need for hepatitis C screening test   - Hepatitis C Antibody  3. Arthritis   - Rheumatoid factor - DG Cervical Spine Complete; Future - oxyCODONE-acetaminophen (PERCOCET/ROXICET) 5-325 MG tablet; Take 1 tablet by mouth every 6 (six) hours as needed for moderate pain or severe pain.  Dispense: 30 tablet; Refill: 0  4. Generalized anxiety disorder  stable - ALPRAZolam (XANAX) 0.5 MG tablet; Take 1 tablet (0.5 mg total) by mouth 3 (three) times daily as needed.  Dispense: 30 tablet; Refill: 2

## 2015-09-12 LAB — HEPATITIS C ANTIBODY: HCV Ab: NEGATIVE

## 2015-10-12 ENCOUNTER — Encounter: Payer: Self-pay | Admitting: Family Medicine

## 2015-11-09 ENCOUNTER — Other Ambulatory Visit: Payer: Self-pay | Admitting: Family Medicine

## 2015-11-09 DIAGNOSIS — Z1231 Encounter for screening mammogram for malignant neoplasm of breast: Secondary | ICD-10-CM

## 2015-11-14 ENCOUNTER — Telehealth: Payer: Self-pay | Admitting: Family Medicine

## 2015-11-14 NOTE — Telephone Encounter (Signed)
Previous letter stated:  It is my medical opinion that Sonya [name] should not go below 145-150 lbs. The patient is stuck at 155 and has not gone down and she would like the letter to reflect a goal weight of 155 instead of 145-150 for weight watchers. I have adjusted and sent the letter through mychart.     KP

## 2015-11-14 NOTE — Telephone Encounter (Signed)
Pt called in to request a weight watchers note for a weight goal of 155. She says that she is at that weight.    CB: [phone]

## 2015-11-14 NOTE — Telephone Encounter (Signed)
noted 

## 2015-12-02 ENCOUNTER — Other Ambulatory Visit: Payer: Self-pay | Admitting: Family Medicine

## 2015-12-02 DIAGNOSIS — F411 Generalized anxiety disorder: Secondary | ICD-10-CM

## 2015-12-04 NOTE — Telephone Encounter (Signed)
Last alprazolam Rx:  09/06/15, #30 x 2 refills. No UDS or CSC on file. Next OV: 09/2016  Rx printed and forwarded to PCP for signature.

## 2016-01-22 ENCOUNTER — Ambulatory Visit
Admission: RE | Admit: 2016-01-22 | Discharge: 2016-01-22 | Disposition: A | Discharge status: Home / Self Care | Payer: 59 | Source: Ambulatory Visit | Attending: Family Medicine | Admitting: Family Medicine

## 2016-01-22 DIAGNOSIS — Z1231 Encounter for screening mammogram for malignant neoplasm of breast: Secondary | ICD-10-CM

## 2016-04-19 ENCOUNTER — Telehealth: Payer: Self-pay | Admitting: Medical

## 2016-04-19 ENCOUNTER — Ambulatory Visit (INDEPENDENT_AMBULATORY_CARE_PROVIDER_SITE_OTHER): Payer: 59 | Admitting: Medical

## 2016-04-19 ENCOUNTER — Encounter: Payer: Self-pay | Admitting: Medical

## 2016-04-19 VITALS — BP 129/86 | HR 71 | Temp 98.2°F | Ht 64.0 in | Wt 152.2 lb

## 2016-04-19 DIAGNOSIS — J029 Acute pharyngitis, unspecified: Secondary | ICD-10-CM | POA: Diagnosis not present

## 2016-04-19 DIAGNOSIS — F411 Generalized anxiety disorder: Secondary | ICD-10-CM

## 2016-04-19 DIAGNOSIS — M1990 Unspecified osteoarthritis, unspecified site: Secondary | ICD-10-CM | POA: Diagnosis not present

## 2016-04-19 DIAGNOSIS — R52 Pain, unspecified: Secondary | ICD-10-CM | POA: Diagnosis not present

## 2016-04-19 LAB — POCT RAPID STREP A (OFFICE): Rapid Strep A Screen: POSITIVE — AB

## 2016-04-19 LAB — POCT INFLUENZA A/B
Influenza A, POC: NEGATIVE
Influenza B, POC: NEGATIVE

## 2016-04-19 MED ORDER — ALPRAZOLAM 0.5 MG PO TABS: 0.5000 mg | ORAL_TABLET | Freq: Three times a day (TID) | ORAL | 0 refills | Status: DC | PRN

## 2016-04-19 MED ORDER — AMOXICILLIN-POT CLAVULANATE 875-125 MG PO TABS: 1.0000 | ORAL_TABLET | Freq: Two times a day (BID) | ORAL | 0 refills | Status: DC

## 2016-04-19 MED ORDER — BENZONATATE 100 MG PO CAPS: 100.0000 mg | ORAL_CAPSULE | Freq: Three times a day (TID) | ORAL | 0 refills | Status: DC | PRN

## 2016-04-19 MED ORDER — OXYCODONE-ACETAMINOPHEN 5-325 MG PO TABS: 1.0000 | ORAL_TABLET | Freq: Four times a day (QID) | ORAL | 0 refills | Status: DC | PRN

## 2016-04-19 MED ORDER — AZELASTINE HCL 0.1 % NA SOLN: 2.0000 | Freq: Two times a day (BID) | NASAL | 3 refills | Status: AC

## 2016-04-19 NOTE — Telephone Encounter (Signed)
  Dr. Etter Sjogren Pt came in today for strep throat, rx of pain med and anxiety meds.. Then at end very end she mentioned she had complete physical in August 2017. She was wondering why  Lipid/other labs were not drawn. She state she was fasting.  So much time has passed that I feel can't order labs and associate to that past physical. Do you want me to just advise her to come in for new physical in August after a year.   Thanks, Percell Miller

## 2016-04-19 NOTE — Patient Instructions (Addendum)
Your strep test was positive. I am prescribing augmentin antibiotic. Rest hydrate, tylenol for fever and warm salt water gargles. Follow up in 7 days or as needed.  For congestion use nasocort.  Will rx astelin as add on.  For cough rx benzonatate.  Follow up in 7 days or as needed  I am providing limited refill of you oxycodone and xanax. For further refills please contact pcp.

## 2016-04-19 NOTE — Progress Notes (Signed)
Subjective:    Patient ID: Sonya [name], female    DOB: [birth year], 56 y.o.   MRN: [id number]  HPI  Pt in with st since Sunday. Hurts a lot to swallow. Was severe and now pain is less.  Pt had ha, body chills and fatigue. Some nasal congestion. Ear pain. Some faint sinus pressure.  Hx of fibromyalgia and arthritis. Pt request refill of her oxycodone..  Also refill of her xanax. She states chronic anxiety.   Pt states pcp has filled both in past. She states she is not on contract.  Review of Systems  Constitutional: Positive for chills, fatigue and fever.  HENT: Positive for congestion, sinus pain, sinus pressure and sore throat.   Respiratory: Positive for cough. Negative for shortness of breath and wheezing.   Cardiovascular: Negative for chest pain and palpitations.  Gastrointestinal: Negative for abdominal pain, diarrhea, nausea and vomiting.  Musculoskeletal: Positive for arthralgias. Negative for neck pain and neck stiffness.  Skin: Negative for rash.  Neurological: Positive for headaches. Negative for dizziness, seizures, speech difficulty, weakness and numbness.  Hematological: Negative for adenopathy. Does not bruise/bleed easily.  Psychiatric/Behavioral: Negative for behavioral problems, confusion, sleep disturbance and suicidal ideas. The patient is nervous/anxious.     Past Medical History:  Diagnosis Date  . Arthritis   . Fibromyalgia   . Hypertension      Social History   Social History  . Marital status: Married    Spouse name: N/A  . Number of children: N/A  . Years of education: N/A   Occupational History  . Not on file.   Social History Main Topics  . Smoking status: Never Smoker  . Smokeless tobacco: Never Used  . Alcohol use No  . Drug use: No  . Sexual activity: Not on file   Other Topics Concern  . Not on file   Social History Narrative  . No narrative on file    Past Surgical History:  Procedure Laterality Date  . CESAREAN  SECTION  1987    Family History  Problem Relation Age of Onset  . Arthritis Mother   . Hyperlipidemia Mother   . Hypertension Mother   . Diabetes Mother   . Arthritis Father   . Hyperlipidemia Father   . Heart failure Father     Hx disease  . Hypertension Father   . Stroke Father   . Prostate cancer Father   . Lung cancer Paternal Grandfather     smoker  . Stomach cancer Maternal Grandmother     Allergies  Allergen Reactions  . Atorvastatin Other (See Comments)    Leg cramps    Current Outpatient Prescriptions on File Prior to Visit  Medication Sig Dispense Refill  . Ascorbic Acid (VITAMIN C) 1000 MG tablet Take 1,000 mg by mouth daily.    . BELSOMRA 15 MG TABS TAKE 1 TABLET BY MOUTH EVERY DAY AT BEDTIME AS NEEDED 30 tablet 3  . diclofenac sodium (VOLTAREN) 1 % GEL Apply 1 application topically 4 (four) times daily as needed. 100 g 2  . Glucosamine-Chondroit-Vit C-Mn (GLUCOSAMINE 1500 COMPLEX) CAPS Take 1 capsule by mouth daily.    . hydrochlorothiazide (HYDRODIURIL) 25 MG tablet TAKE 1 TABLET (25 MG TOTAL) BY MOUTH DAILY. 90 tablet 1  . ibuprofen (ADVIL,MOTRIN) 600 MG tablet Take 1 tablet (600 mg total) by mouth every 6 (six) hours as needed. 30 tablet 0  . meclizine (ANTIVERT) 25 MG tablet Take 25 mg by mouth 3 (three) times  daily as needed for dizziness.    . SUMAtriptan (IMITREX) 50 MG tablet Take 50 mg by mouth as needed.    . Turmeric, Curcuma Longa, (CURCUMIN EXTRACT) POWD Take 48 mg by mouth daily.     No current facility-administered medications on file prior to visit.     BP 129/86   Pulse 71   Temp 98.2 F (36.8 C) (Oral)   Ht 5\' 4"  (1.626 m)   Wt 152 lb 3.2 oz (69 kg)   LMP 01/05/2016 (Exact Date)   SpO2 98%   BMI 26.13 kg/m       Objective:   Physical Exam   General  Mental Status - Alert. General Appearance - Well groomed. Not in acute distress.  Skin Rashes- No Rashes.  HEENT Head- Normal. Ear Auditory Canal - Left- Normal. Right -  Normal.Tympanic Membrane- Left- Normal. Right- Normal. Eye Sclera/Conjunctiva- Left- Normal. Right- Normal. Nose & Sinuses Nasal Mucosa- Left-  Boggy and Congested. Right-  Boggy and  Congested.Bilateral maxillary and frontal sinus pressure. Mouth & Throat Lips: Upper Lip- Normal: no dryness, cracking, pallor, cyanosis, or vesicular eruption. Lower Lip-Normal: no dryness, cracking, pallor, cyanosis or vesicular eruption. Buccal Mucosa- Bilateral- No Aphthous ulcers. Oropharynx- No Discharge or Erythema. Tonsils: Characteristics- Bilateral- No Erythema or Congestion. Size/Enlargement- Bilateral- No enlargement. Discharge- bilateral-None.  Neck Neck- Supple. No Masses.   Chest and Lung Exam Auscultation: Breath Sounds:-Clear even and unlabored.  Cardiovascular Auscultation:Rythm- Regular, rate and rhythm. Murmurs & Other Heart Sounds:Ausculatation of the heart reveal- No Murmurs.  Lymphatic Head & Neck General Head & Neck Lymphatics: Bilateral: Description- No Localized lymphadenopathy.      Assessment & Plan:  Your strep test was positive. I am prescribing augmentin antibiotic. Rest hydrate, tylenol for fever and warm salt water gargles. Follow up in 7 days or as needed.  For congestion use nasocort.  Will rx astelin as add on.  For cough rx benzonatate.  Follow up in 7 days or as needed  I am providing limited refill of you oxycodone and xanax. For further refills please contact pcp.  Arieonna Medine, Percell Miller, PA-C

## 2016-04-19 NOTE — Progress Notes (Signed)
Pre visit review using our clinic review tool, if applicable. No additional management support is needed unless otherwise documented below in the visit note. 

## 2016-04-20 ENCOUNTER — Other Ambulatory Visit: Payer: Self-pay | Admitting: Family Medicine

## 2016-04-20 DIAGNOSIS — Z0000 Encounter for general adult medical examination without abnormal findings: Secondary | ICD-10-CM

## 2016-04-20 NOTE — Telephone Encounter (Signed)
I'm not sure what happened with the labs but I put the order in so if she wants to have them done we can check them.  I'll have Robin call her.

## 2016-05-14 ENCOUNTER — Ambulatory Visit (INDEPENDENT_AMBULATORY_CARE_PROVIDER_SITE_OTHER): Payer: 59 | Admitting: Family Medicine

## 2016-05-14 ENCOUNTER — Encounter: Payer: Self-pay | Admitting: Family Medicine

## 2016-05-14 VITALS — BP 116/80 | HR 63 | Temp 98.1°F | Resp 16 | Ht 64.0 in | Wt 149.4 lb

## 2016-05-14 DIAGNOSIS — R05 Cough: Secondary | ICD-10-CM

## 2016-05-14 DIAGNOSIS — I10 Essential (primary) hypertension: Secondary | ICD-10-CM | POA: Insufficient documentation

## 2016-05-14 DIAGNOSIS — R059 Cough, unspecified: Secondary | ICD-10-CM

## 2016-05-14 DIAGNOSIS — F411 Generalized anxiety disorder: Secondary | ICD-10-CM

## 2016-05-14 DIAGNOSIS — R52 Pain, unspecified: Secondary | ICD-10-CM

## 2016-05-14 DIAGNOSIS — J029 Acute pharyngitis, unspecified: Secondary | ICD-10-CM | POA: Diagnosis not present

## 2016-05-14 DIAGNOSIS — J329 Chronic sinusitis, unspecified: Secondary | ICD-10-CM

## 2016-05-14 LAB — LIPID PANEL
Cholesterol: 255 mg/dL — ABNORMAL HIGH (ref 0–200)
HDL: 61.2 mg/dL (ref >39.00)
LDL Cholesterol: 179 mg/dL — ABNORMAL HIGH (ref 0–99)
NonHDL: 194.14
Total CHOL/HDL Ratio: 4
Triglycerides: 77 mg/dL (ref 0.0–149.0)
VLDL: 15.4 mg/dL (ref 0.0–40.0)

## 2016-05-14 LAB — COMPREHENSIVE METABOLIC PANEL
ALT: 11 U/L (ref 0–35)
AST: 14 U/L (ref 0–37)
Albumin: 4 g/dL (ref 3.5–5.2)
Alkaline Phosphatase: 57 U/L (ref 39–117)
BUN: 11 mg/dL (ref 6–23)
CO2: 32 mEq/L (ref 19–32)
Calcium: 9.2 mg/dL (ref 8.4–10.5)
Chloride: 101 mEq/L (ref 96–112)
Creatinine, Ser: 0.67 mg/dL (ref 0.40–1.20)
GFR: 97.02 mL/min (ref >60.00)
Glucose, Bld: 100 mg/dL — ABNORMAL HIGH (ref 70–99)
Potassium: 3.6 mEq/L (ref 3.5–5.1)
Sodium: 139 mEq/L (ref 135–145)
Total Bilirubin: 0.6 mg/dL (ref 0.2–1.2)
Total Protein: 6.8 g/dL (ref 6.0–8.3)

## 2016-05-14 LAB — CBC WITH DIFFERENTIAL/PLATELET
Basophils Absolute: 0 10*3/uL (ref 0.0–0.1)
Basophils Relative: 0.8 % (ref 0.0–3.0)
Eosinophils Absolute: 0.2 10*3/uL (ref 0.0–0.7)
Eosinophils Relative: 3.1 % (ref 0.0–5.0)
HCT: 42.1 % (ref 36.0–46.0)
Hemoglobin: 14.3 g/dL (ref 12.0–15.0)
Lymphocytes Relative: 17.8 % (ref 12.0–46.0)
Lymphs Abs: 0.9 10*3/uL (ref 0.7–4.0)
MCHC: 34 g/dL (ref 30.0–36.0)
MCV: 88 fl (ref 78.0–100.0)
Monocytes Absolute: 0.6 10*3/uL (ref 0.1–1.0)
Monocytes Relative: 10.8 % (ref 3.0–12.0)
Neutro Abs: 3.5 10*3/uL (ref 1.4–7.7)
Neutrophils Relative %: 67.5 % (ref 43.0–77.0)
Platelets: 134 10*3/uL — ABNORMAL LOW (ref 150.0–400.0)
RBC: 4.78 Mil/uL (ref 3.87–5.11)
RDW: 14.5 % (ref 11.5–15.5)
WBC: 5.2 10*3/uL (ref 4.0–10.5)

## 2016-05-14 LAB — POCT RAPID STREP A (OFFICE): Rapid Strep A Screen: NEGATIVE

## 2016-05-14 LAB — POC URINALSYSI DIPSTICK (AUTOMATED)
Bilirubin, UA: NEGATIVE
Blood, UA: NEGATIVE
Glucose, UA: NEGATIVE
Ketones, UA: NEGATIVE
Leukocytes, UA: NEGATIVE
Nitrite, UA: NEGATIVE
Protein, UA: NEGATIVE
Spec Grav, UA: 1.01 (ref 1.030–1.035)
Urobilinogen, UA: 0.2 (ref ?–2.0)
pH, UA: 7 (ref 5.0–8.0)

## 2016-05-14 LAB — TSH: TSH: 2.78 u[IU]/mL (ref 0.35–4.50)

## 2016-05-14 LAB — POC INFLUENZA A&B (BINAX/QUICKVUE)
Influenza A, POC: NEGATIVE
Influenza B, POC: NEGATIVE

## 2016-05-14 MED ORDER — CEFUROXIME AXETIL 500 MG PO TABS: 500.0000 mg | ORAL_TABLET | Freq: Two times a day (BID) | ORAL | 0 refills | Status: DC

## 2016-05-14 MED ORDER — PROMETHAZINE-DM 6.25-15 MG/5ML PO SYRP: 5.0000 mL | ORAL_SOLUTION | Freq: Four times a day (QID) | ORAL | 0 refills | Status: DC | PRN

## 2016-05-14 NOTE — Progress Notes (Signed)
Pre visit review using our clinic review tool, if applicable. No additional management support is needed unless otherwise documented below in the visit note. 

## 2016-05-14 NOTE — Patient Instructions (Signed)

## 2016-05-14 NOTE — Progress Notes (Signed)
Patient ID: Sonya [name], female   DOB: Sep 04, 1960, 56 y.o.   MRN: [id number]     Subjective:  I acted as a Education administrator for Dr. Carollee Herter.  Guerry Bruin, [place]   Patient ID: Sonya [name], female    DOB: [birth year], 56 y.o.   MRN: [id number]  Chief Complaint  Patient presents with  . Sore Throat  . bodyaches    HPI  Patient is in today for sore throat, bodyaches, and headaches.  She was seen on 04/19/16 and she got better.  The symptoms started again about a week ago. She has taken ibuprofen.  Her last dose was yesterday.  Has coughed a little.  Has a little congestion.    Patient Care Team: [name], DO as PCP - General (Family Medicine)   Past Medical History:  Diagnosis Date  . Arthritis   . Fibromyalgia   . Hypertension     Past Surgical History:  Procedure Laterality Date  . CESAREAN SECTION  1987    Family History  Problem Relation Age of Onset  . Arthritis Mother   . Hyperlipidemia Mother   . Hypertension Mother   . Diabetes Mother   . Arthritis Father   . Hyperlipidemia Father   . Heart failure Father     Hx disease  . Hypertension Father   . Stroke Father   . Prostate cancer Father   . Lung cancer Paternal Grandfather     smoker  . Stomach cancer Maternal Grandmother     Social History   Social History  . Marital status: Married    Spouse name: N/A  . Number of children: N/A  . Years of education: N/A   Occupational History  . Not on file.   Social History Main Topics  . Smoking status: Never Smoker  . Smokeless tobacco: Never Used  . Alcohol use No  . Drug use: No  . Sexual activity: Not on file   Other Topics Concern  . Not on file   Social History Narrative  . No narrative on file    Outpatient Medications Prior to Visit  Medication Sig Dispense Refill  . ALPRAZolam (XANAX) 0.5 MG tablet Take 1 tablet (0.5 mg total) by mouth 3 (three) times daily as needed. 30 tablet 0  . Ascorbic Acid (VITAMIN C) 1000 MG tablet Take  1,000 mg by mouth daily.    Marland Kitchen azelastine (ASTELIN) 0.1 % nasal spray Place 2 sprays into both nostrils 2 (two) times daily. Use in each nostril as directed 30 mL 3  . BELSOMRA 15 MG TABS TAKE 1 TABLET BY MOUTH EVERY DAY AT BEDTIME AS NEEDED 30 tablet 3  . diclofenac sodium (VOLTAREN) 1 % GEL Apply 1 application topically 4 (four) times daily as needed. 100 g 2  . Glucosamine-Chondroit-Vit C-Mn (GLUCOSAMINE 1500 COMPLEX) CAPS Take 1 capsule by mouth daily.    . hydrochlorothiazide (HYDRODIURIL) 25 MG tablet TAKE 1 TABLET (25 MG TOTAL) BY MOUTH DAILY. 90 tablet 1  . ibuprofen (ADVIL,MOTRIN) 600 MG tablet Take 1 tablet (600 mg total) by mouth every 6 (six) hours as needed. 30 tablet 0  . meclizine (ANTIVERT) 25 MG tablet Take 25 mg by mouth 3 (three) times daily as needed for dizziness.    Marland Kitchen oxyCODONE-acetaminophen (PERCOCET/ROXICET) 5-325 MG tablet Take 1 tablet by mouth every 6 (six) hours as needed for moderate pain or severe pain. 20 tablet 0  . SUMAtriptan (IMITREX) 50 MG tablet Take 50 mg by mouth as  needed.    . Turmeric, Curcuma Longa, (CURCUMIN EXTRACT) POWD Take 48 mg by mouth daily.    Marland Kitchen amoxicillin-clavulanate (AUGMENTIN) 875-125 MG tablet Take 1 tablet by mouth 2 (two) times daily. 20 tablet 0  . benzonatate (TESSALON) 100 MG capsule Take 1 capsule (100 mg total) by mouth 3 (three) times daily as needed for cough. 21 capsule 0   No facility-administered medications prior to visit.     Allergies  Allergen Reactions  . Atorvastatin Other (See Comments)    Leg cramps    Review of Systems  Constitutional: Positive for chills and diaphoresis.  HENT: Positive for congestion and sore throat.   Respiratory: Positive for cough.   Neurological: Positive for headaches.       Objective:    Physical Exam  Constitutional: She is oriented to person, place, and time. She appears well-developed and well-nourished. No distress.  HENT:  Head: Normocephalic and atraumatic.  Mouth/Throat:  Posterior oropharyngeal erythema present. No posterior oropharyngeal edema.  Eyes: Conjunctivae are normal.  Neck: Normal range of motion. No thyromegaly present.  Cardiovascular: Normal rate and regular rhythm.   Pulmonary/Chest: Effort normal and breath sounds normal. She has no wheezes.  Abdominal: Soft. Bowel sounds are normal. There is no tenderness.  Musculoskeletal: Normal range of motion. She exhibits no edema or deformity.  Lymphadenopathy:    She has cervical adenopathy.  Neurological: She is alert and oriented to person, place, and time.  Skin: Skin is warm and dry. She is not diaphoretic.  Psychiatric: She has a normal mood and affect.  Nursing note and vitals reviewed.   BP 116/80 (BP Location: Right Arm, Cuff Size: Normal)   Pulse 63   Temp 98.1 F (36.7 C) (Oral)   Resp 16   Ht 5\' 4"  (1.626 m)   Wt 149 lb 6.4 oz (67.8 kg)   LMP 01/16/2016   SpO2 98%   BMI 25.64 kg/m  Wt Readings from Last 3 Encounters:  05/14/16 149 lb 6.4 oz (67.8 kg)  04/19/16 152 lb 3.2 oz (69 kg)  09/11/15 159 lb (72.1 kg)   BP Readings from Last 3 Encounters:  05/14/16 116/80  04/19/16 129/86  09/11/15 134/78     Immunization History  Administered Date(s) Administered  . Influenza,inj,Quad PF,36+ Mos 11/30/2014  . Tdap 02/05/2011    Health Maintenance  Topic Date Due  . COLONOSCOPY  01/26/2011  . HIV Screening  09/10/2016 (Originally 01/26/1976)  . INFLUENZA VACCINE  09/04/2016  . PAP SMEAR  09/08/2017  . MAMMOGRAM  01/21/2018  . TETANUS/TDAP  02/04/2021  . Hepatitis C Screening  Completed    Lab Results  Component Value Date   WBC 5.2 05/14/2016   HGB 14.3 05/14/2016   HCT 42.1 05/14/2016   PLT 134.0 (L) 05/14/2016   GLUCOSE 100 (H) 05/14/2016   CHOL 255 (H) 05/14/2016   TRIG 77.0 05/14/2016   HDL 61.20 05/14/2016   LDLCALC 179 (H) 05/14/2016   ALT 11 05/14/2016   AST 14 05/14/2016   NA 139 05/14/2016   K 3.6 05/14/2016   CL 101 05/14/2016   CREATININE 0.67  05/14/2016   BUN 11 05/14/2016   CO2 32 05/14/2016   TSH 2.78 05/14/2016    Lab Results  Component Value Date   TSH 2.78 05/14/2016   Lab Results  Component Value Date   WBC 5.2 05/14/2016   HGB 14.3 05/14/2016   HCT 42.1 05/14/2016   MCV 88.0 05/14/2016   PLT 134.0 (L) 05/14/2016  Lab Results  Component Value Date   NA 139 05/14/2016   K 3.6 05/14/2016   CO2 32 05/14/2016   GLUCOSE 100 (H) 05/14/2016   BUN 11 05/14/2016   CREATININE 0.67 05/14/2016   BILITOT 0.6 05/14/2016   ALKPHOS 57 05/14/2016   AST 14 05/14/2016   ALT 11 05/14/2016   PROT 6.8 05/14/2016   ALBUMIN 4.0 05/14/2016   CALCIUM 9.2 05/14/2016   GFR 97.02 05/14/2016   Lab Results  Component Value Date   CHOL 255 (H) 05/14/2016   Lab Results  Component Value Date   HDL 61.20 05/14/2016   Lab Results  Component Value Date   LDLCALC 179 (H) 05/14/2016   Lab Results  Component Value Date   TRIG 77.0 05/14/2016   Lab Results  Component Value Date   CHOLHDL 4 05/14/2016   No results found for: HGBA1C       Assessment & Plan:   Problem List Items Addressed This Visit      Unprioritized   Essential hypertension   Relevant Orders   POCT Urinalysis Dipstick (Automated) (Completed)   CBC with Differential/Platelet (Completed)   Comprehensive metabolic panel (Completed)   Lipid panel (Completed)   TSH (Completed)   Generalized anxiety disorder   Relevant Orders   CBC with Differential/Platelet (Completed)   Comprehensive metabolic panel (Completed)   Lipid panel (Completed)   TSH (Completed)    Other Visit Diagnoses    Body aches    -  Primary   Relevant Orders   POC Influenza A&B (Binax test) (Completed)   Sore throat       Relevant Orders   POCT rapid strep A (Completed)   Cough       Relevant Medications   promethazine-dextromethorphan (PROMETHAZINE-DM) 6.25-15 MG/5ML syrup   Sinusitis, unspecified chronicity, unspecified location       Relevant Medications    promethazine-dextromethorphan (PROMETHAZINE-DM) 6.25-15 MG/5ML syrup   cefUROXime (CEFTIN) 500 MG tablet    neg flu and strep  I have discontinued Ms. Desilva's amoxicillin-clavulanate and benzonatate. I am also having her start on promethazine-dextromethorphan and cefUROXime. Additionally, I am having her maintain her ibuprofen, SUMAtriptan, GLUCOSAMINE 1500 COMPLEX, vitamin C, Curcumin Extract, meclizine, diclofenac sodium, BELSOMRA, hydrochlorothiazide, ALPRAZolam, oxyCODONE-acetaminophen, and azelastine.  Meds ordered this encounter  Medications  . promethazine-dextromethorphan (PROMETHAZINE-DM) 6.25-15 MG/5ML syrup    Sig: Take 5 mLs by mouth 4 (four) times daily as needed for cough.    Dispense:  118 mL    Refill:  0  . cefUROXime (CEFTIN) 500 MG tablet    Sig: Take 1 tablet (500 mg total) by mouth 2 (two) times daily with a meal.    Dispense:  20 tablet    Refill:  0    CMA served as scribe during this visit. History, Physical and Plan performed by medical provider. Documentation and orders reviewed and attested to.  [name], DO

## 2016-05-27 ENCOUNTER — Other Ambulatory Visit: Payer: Self-pay | Admitting: Family Medicine

## 2016-08-08 ENCOUNTER — Other Ambulatory Visit: Payer: Self-pay | Admitting: Family Medicine

## 2016-08-08 DIAGNOSIS — F411 Generalized anxiety disorder: Secondary | ICD-10-CM

## 2016-08-08 NOTE — Telephone Encounter (Signed)
[facility]: she filled 30 xanax on 3/16 per Percell Miller.  Will refill for her today

## 2016-08-08 NOTE — Telephone Encounter (Signed)
Requesting:   alprazolam Contract   none UDS    none Last OV    05/14/2016 Last Refill    #30 no refills on 04/19/2016  Please Advise

## 2016-09-16 ENCOUNTER — Ambulatory Visit (INDEPENDENT_AMBULATORY_CARE_PROVIDER_SITE_OTHER): Payer: 59 | Admitting: Family Medicine

## 2016-09-16 ENCOUNTER — Encounter: Payer: Self-pay | Admitting: Family Medicine

## 2016-09-16 VITALS — BP 110/76 | HR 60 | Temp 98.0°F | Ht 64.0 in | Wt 156.1 lb

## 2016-09-16 DIAGNOSIS — Z8249 Family history of ischemic heart disease and other diseases of the circulatory system: Secondary | ICD-10-CM

## 2016-09-16 DIAGNOSIS — M19042 Primary osteoarthritis, left hand: Secondary | ICD-10-CM

## 2016-09-16 DIAGNOSIS — M19041 Primary osteoarthritis, right hand: Secondary | ICD-10-CM

## 2016-09-16 DIAGNOSIS — F411 Generalized anxiety disorder: Secondary | ICD-10-CM

## 2016-09-16 DIAGNOSIS — I10 Essential (primary) hypertension: Secondary | ICD-10-CM

## 2016-09-16 DIAGNOSIS — Z0000 Encounter for general adult medical examination without abnormal findings: Secondary | ICD-10-CM | POA: Diagnosis not present

## 2016-09-16 DIAGNOSIS — M1990 Unspecified osteoarthritis, unspecified site: Secondary | ICD-10-CM | POA: Diagnosis not present

## 2016-09-16 MED ORDER — ALPRAZOLAM 0.5 MG PO TABS: 0.5000 mg | ORAL_TABLET | Freq: Three times a day (TID) | ORAL | 0 refills | Status: DC | PRN

## 2016-09-16 MED ORDER — OXYCODONE-ACETAMINOPHEN 5-325 MG PO TABS: 1.0000 | ORAL_TABLET | Freq: Four times a day (QID) | ORAL | 0 refills | Status: DC | PRN

## 2016-09-16 MED ORDER — HYDROCHLOROTHIAZIDE 25 MG PO TABS: ORAL_TABLET | ORAL | 1 refills | Status: DC

## 2016-09-16 MED ORDER — CELECOXIB 200 MG PO CAPS: 200.0000 mg | ORAL_CAPSULE | Freq: Two times a day (BID) | ORAL | 5 refills | Status: DC

## 2016-09-16 MED ORDER — DICLOFENAC SODIUM 1 % TD GEL: 1.0000 "application " | Freq: Four times a day (QID) | TRANSDERMAL | 2 refills | Status: DC | PRN

## 2016-09-16 NOTE — Progress Notes (Signed)
Pre visit review using our clinic review tool, if applicable. No additional management support is needed unless otherwise documented below in the visit note. 

## 2016-09-16 NOTE — Patient Instructions (Signed)
Preventive Care 40-64 Years, Female Preventive care refers to lifestyle choices and visits with your health care provider that can promote health and wellness. What does preventive care include?  A yearly physical exam. This is also called an annual well check.  Dental exams once or twice a year.  Routine eye exams. Ask your health care provider how often you should have your eyes checked.  Personal lifestyle choices, including: ? Daily care of your teeth and gums. ? Regular physical activity. ? Eating a healthy diet. ? Avoiding tobacco and drug use. ? Limiting alcohol use. ? Practicing safe sex. ? Taking low-dose aspirin daily starting at age 58. ? Taking vitamin and mineral supplements as recommended by your health care provider. What happens during an annual well check? The services and screenings done by your health care provider during your annual well check will depend on your age, overall health, lifestyle risk factors, and family history of disease. Counseling Your health care provider may ask you questions about your:  Alcohol use.  Tobacco use.  Drug use.  Emotional well-being.  Home and relationship well-being.  Sexual activity.  Eating habits.  Work and work Statistician.  Method of birth control.  Menstrual cycle.  Pregnancy history.  Screening You may have the following tests or measurements:  Height, weight, and BMI.  Blood pressure.  Lipid and cholesterol levels. These may be checked every 5 years, or more frequently if you are over 81 years old.  Skin check.  Lung cancer screening. You may have this screening every year starting at age 78 if you have a 30-pack-year history of smoking and currently smoke or have quit within the past 15 years.  Fecal occult blood test (FOBT) of the stool. You may have this test every year starting at age 65.  Flexible sigmoidoscopy or colonoscopy. You may have a sigmoidoscopy every 5 years or a colonoscopy  every 10 years starting at age 30.  Hepatitis C blood test.  Hepatitis B blood test.  Sexually transmitted disease (STD) testing.  Diabetes screening. This is done by checking your blood sugar (glucose) after you have not eaten for a while (fasting). You may have this done every 1-3 years.  Mammogram. This may be done every 1-2 years. Talk to your health care provider about when you should start having regular mammograms. This may depend on whether you have a family history of breast cancer.  BRCA-related cancer screening. This may be done if you have a family history of breast, ovarian, tubal, or peritoneal cancers.  Pelvic exam and Pap test. This may be done every 3 years starting at age 80. Starting at age 36, this may be done every 5 years if you have a Pap test in combination with an HPV test.  Bone density scan. This is done to screen for osteoporosis. You may have this scan if you are at high risk for osteoporosis.  Discuss your test results, treatment options, and if necessary, the need for more tests with your health care provider. Vaccines Your health care provider may recommend certain vaccines, such as:  Influenza vaccine. This is recommended every year.  Tetanus, diphtheria, and acellular pertussis (Tdap, Td) vaccine. You may need a Td booster every 10 years.  Varicella vaccine. You may need this if you have not been vaccinated.  Zoster vaccine. You may need this after age 5.  Measles, mumps, and rubella (MMR) vaccine. You may need at least one dose of MMR if you were born in  1957 or later. You may also need a second dose.  Pneumococcal 13-valent conjugate (PCV13) vaccine. You may need this if you have certain conditions and were not previously vaccinated.  Pneumococcal polysaccharide (PPSV23) vaccine. You may need one or two doses if you smoke cigarettes or if you have certain conditions.  Meningococcal vaccine. You may need this if you have certain  conditions.  Hepatitis A vaccine. You may need this if you have certain conditions or if you travel or work in places where you may be exposed to hepatitis A.  Hepatitis B vaccine. You may need this if you have certain conditions or if you travel or work in places where you may be exposed to hepatitis B.  Haemophilus influenzae type b (Hib) vaccine. You may need this if you have certain conditions.  Talk to your health care provider about which screenings and vaccines you need and how often you need them. This information is not intended to replace advice given to you by your health care provider. Make sure you discuss any questions you have with your health care provider. Document Released: 02/17/2015 Document Revised: 10/11/2015 Document Reviewed: 11/22/2014 Elsevier Interactive Patient Education  2017 [employer].

## 2016-09-16 NOTE — Progress Notes (Signed)
Subjective:     Sonya [name] is a 56 y.o. female and is here for a comprehensive physical exam. The patient reports no problems.  Social History   Social History  . Marital status: Married    Spouse name: N/A  . Number of children: N/A  . Years of education: N/A   Occupational History  . Not on file.   Social History Main Topics  . Smoking status: Never Smoker  . Smokeless tobacco: Never Used  . Alcohol use No  . Drug use: No  . Sexual activity: Not on file   Other Topics Concern  . Not on file   Social History Narrative  . No narrative on file   Health Maintenance  Topic Date Due  . HIV Screening  01/26/1976  . COLONOSCOPY  01/26/2011  . INFLUENZA VACCINE  09/04/2016  . PAP SMEAR  09/08/2017  . MAMMOGRAM  01/21/2018  . TETANUS/TDAP  02/04/2021  . Hepatitis C Screening  Completed    The following portions of the patient's history were reviewed and updated as appropriate:  She  has a past medical history of Arthritis; Fibromyalgia; and Hypertension. She  does not have any pertinent problems on file. She  has a past surgical history that includes Cesarean section (1987). Her family history includes Alcohol abuse in her sister; Arthritis in her father and mother; Diabetes in her mother; Heart attack in her brother; Heart failure in her father; Hyperlipidemia in her father and mother; Hypertension in her father and mother; Lung cancer in her paternal grandfather; Macular degeneration in her father; Prostate cancer in her father; Stomach cancer in her maternal grandmother; Stroke in her father; Subarachnoid hemorrhage in her sister. She  reports that she has never smoked. She has never used smokeless tobacco. She reports that she does not drink alcohol or use drugs. She has a current medication list which includes the following prescription(s): alprazolam, vitamin c, aspirin ec, azelastine, biotin, [facility] omega-3 krill oil, diclofenac sodium, hydrochlorothiazide, ibuprofen,  multivitamin, eq vision formula 50+, oxycodone-acetaminophen, sumatriptan, curcumin extract, and celecoxib. Current Outpatient Prescriptions on File Prior to Visit  Medication Sig Dispense Refill  . Ascorbic Acid (VITAMIN C) 1000 MG tablet Take 1,000 mg by mouth daily.    Marland Kitchen azelastine (ASTELIN) 0.1 % nasal spray Place 2 sprays into both nostrils 2 (two) times daily. Use in each nostril as directed 30 mL 3  . ibuprofen (ADVIL,MOTRIN) 600 MG tablet Take 1 tablet (600 mg total) by mouth every 6 (six) hours as needed. 30 tablet 0  . SUMAtriptan (IMITREX) 50 MG tablet Take 50 mg by mouth as needed.    . Turmeric, Curcuma Longa, (CURCUMIN EXTRACT) POWD Take 48 mg by mouth daily.     No current facility-administered medications on file prior to visit.    She is allergic to atorvastatin..  Review of Systems Review of Systems  Constitutional: Negative for activity change, appetite change and fatigue.  HENT: Negative for hearing loss, congestion, tinnitus and ear discharge.  dentist q4m Eyes: Negative for visual disturbance (see optho q1y -- vision corrected to 20/20 with glasses).  Respiratory: Negative for cough, chest tightness and shortness of breath.   Cardiovascular: Negative for chest pain, palpitations and leg swelling.  Gastrointestinal: Negative for abdominal pain, diarrhea, constipation and abdominal distention.  Genitourinary: Negative for urgency, frequency, decreased urine volume and difficulty urinating.  Musculoskeletal: Negative for back pain, arthralgias and gait problem.  Skin: Negative for color change, pallor and rash.  Neurological: Negative  for dizziness, light-headedness, numbness and headaches.  Hematological: Negative for adenopathy. Does not bruise/bleed easily.  Psychiatric/Behavioral: Negative for suicidal ideas, confusion, sleep disturbance, self-injury, dysphoric mood, decreased concentration and agitation.       Objective:    BP 110/76 (BP Location: Left Arm,  Patient Position: Sitting, Cuff Size: Normal)   Pulse 60   Temp 98 F (36.7 C) (Oral)   Ht 5\' 4"  (1.626 m)   Wt 156 lb 2 oz (70.8 kg)   SpO2 98%   BMI 26.80 kg/m  General appearance: alert, cooperative, appears stated age and no distress Head: Normocephalic, without obvious abnormality, atraumatic Eyes: conjunctivae/corneas clear. PERRL, EOM's intact. Fundi benign. Ears: normal TM's and external ear canals both ears Nose: Nares normal. Septum midline. Mucosa normal. No drainage or sinus tenderness. Throat: lips, mucosa, and tongue normal; teeth and gums normal Neck: no adenopathy, no carotid bruit, no JVD, supple, symmetrical, trachea midline and thyroid not enlarged, symmetric, no tenderness/mass/nodules Back: symmetric, no curvature. ROM normal. No CVA tenderness. Lungs: clear to auscultation bilaterally Breasts: normal appearance, no masses or tenderness Heart: regular rate and rhythm, S1, S2 normal, no murmur, click, rub or gallop Abdomen: soft, non-tender; bowel sounds normal; no masses,  no organomegaly Pelvic: deferred Extremities: extremities normal, atraumatic, no cyanosis or edema Pulses: 2+ and symmetric Skin: Skin color, texture, turgor normal. No rashes or lesions Lymph nodes: Cervical, supraclavicular, and axillary nodes normal. Neurologic: Alert and oriented X 3, normal strength and tone. Normal symmetric reflexes. Normal coordination and gait   MS==  Arthritic changes in hands Assessment:    Healthy female exam.      Plan:    ghm utd Check labs See After Visit Summary for Counseling Recommendations    1. Preventative health care See above - Ambulatory referral to Gastroenterology - CBC with Differential/Platelet - Lipid panel - TSH - Comprehensive metabolic panel - POCT Urinalysis Dipstick (Automated)  2. Primary osteoarthritis of both hands  - celecoxib (CELEBREX) 200 MG capsule; Take 1 capsule (200 mg total) by mouth 2 (two) times daily.  Dispense:  30 capsule; Refill: 5 - diclofenac sodium (VOLTAREN) 1 % GEL; Apply 1 application topically 4 (four) times daily as needed.  Dispense: 100 g; Refill: 2  3. Essential hypertension Well controlled, no changes to meds. Encouraged heart healthy diet such as the DASH diet and exercise as tolerated.  - EKG 12-Lead - CBC with Differential/Platelet - Lipid panel - TSH - Comprehensive metabolic panel - POCT Urinalysis Dipstick (Automated) - hydrochlorothiazide (HYDRODIURIL) 25 MG tablet; TAKE 1 TABLET (25 MG TOTAL) BY MOUTH DAILY.  Dispense: 90 tablet; Refill: 1  4. Family history of early CAD  - EKG 12-Lead - CBC with Differential/Platelet - Lipid panel - TSH - Comprehensive metabolic panel - POCT Urinalysis Dipstick (Automated)  5. Generalized anxiety disorder  - ALPRAZolam (XANAX) 0.5 MG tablet; Take 1 tablet (0.5 mg total) by mouth 3 (three) times daily as needed.  Dispense: 30 tablet; Refill: 0  6. Arthritis - oxyCODONE-acetaminophen (PERCOCET/ROXICET) 5-325 MG tablet; Take 1 tablet by mouth every 6 (six) hours as needed for moderate pain or severe pain.  Dispense: 20 tablet; Refill: 0

## 2016-09-17 LAB — COMPREHENSIVE METABOLIC PANEL
ALT: 19 U/L (ref 0–35)
AST: 18 U/L (ref 0–37)
Albumin: 4.2 g/dL (ref 3.5–5.2)
Alkaline Phosphatase: 57 U/L (ref 39–117)
BUN: 14 mg/dL (ref 6–23)
CO2: 32 mEq/L (ref 19–32)
Calcium: 9.4 mg/dL (ref 8.4–10.5)
Chloride: 102 mEq/L (ref 96–112)
Creatinine, Ser: 0.72 mg/dL (ref 0.40–1.20)
GFR: 89.17 mL/min (ref >60.00)
Glucose, Bld: 95 mg/dL (ref 70–99)
Potassium: 3.7 mEq/L (ref 3.5–5.1)
Sodium: 139 mEq/L (ref 135–145)
Total Bilirubin: 0.8 mg/dL (ref 0.2–1.2)
Total Protein: 6 g/dL (ref 6.0–8.3)

## 2016-09-17 LAB — CBC WITH DIFFERENTIAL/PLATELET
Basophils Absolute: 0 10*3/uL (ref 0.0–0.1)
Basophils Relative: 0.9 % (ref 0.0–3.0)
Eosinophils Absolute: 0.2 10*3/uL (ref 0.0–0.7)
Eosinophils Relative: 5 % (ref 0.0–5.0)
HCT: 45.3 % (ref 36.0–46.0)
Hemoglobin: 14.8 g/dL (ref 12.0–15.0)
Lymphocytes Relative: 24.2 % (ref 12.0–46.0)
Lymphs Abs: 1 10*3/uL (ref 0.7–4.0)
MCHC: 32.7 g/dL (ref 30.0–36.0)
MCV: 90.8 fl (ref 78.0–100.0)
Monocytes Absolute: 0.3 10*3/uL (ref 0.1–1.0)
Monocytes Relative: 7.2 % (ref 3.0–12.0)
Neutro Abs: 2.7 10*3/uL (ref 1.4–7.7)
Neutrophils Relative %: 62.7 % (ref 43.0–77.0)
Platelets: 179 10*3/uL (ref 150.0–400.0)
RBC: 4.99 Mil/uL (ref 3.87–5.11)
RDW: 13 % (ref 11.5–15.5)
WBC: 4.2 10*3/uL (ref 4.0–10.5)

## 2016-09-17 LAB — LIPID PANEL
Cholesterol: 298 mg/dL — ABNORMAL HIGH (ref 0–200)
HDL: 63.3 mg/dL (ref >39.00)
LDL Cholesterol: 214 mg/dL — ABNORMAL HIGH (ref 0–99)
NonHDL: 234.72
Total CHOL/HDL Ratio: 5
Triglycerides: 105 mg/dL (ref 0.0–149.0)
VLDL: 21 mg/dL (ref 0.0–40.0)

## 2016-09-17 LAB — TSH: TSH: 1.58 u[IU]/mL (ref 0.35–4.50)

## 2016-09-18 ENCOUNTER — Telehealth: Payer: Self-pay

## 2016-09-18 DIAGNOSIS — E7849 Other hyperlipidemia: Secondary | ICD-10-CM

## 2016-09-18 MED ORDER — PRAVASTATIN SODIUM 20 MG PO TABS: 20.0000 mg | ORAL_TABLET | Freq: Every day | ORAL | 3 refills | Status: DC

## 2016-09-18 NOTE — Telephone Encounter (Signed)
-----   Message from [name], DO sent at 09/18/2016 10:08 AM EDT ----- Cholesterol--- LDL goal < 100,  HDL >40,  TG < 150.  Diet and exercise will increase HDL and decrease LDL and TG.  Fish,  Fish Oil, Flaxseed oil will also help increase the HDL and decrease Triglycerides.   Recheck labs in 3 months I know lipitor caused leg cramps Has she tried any other medication?  If not we can try pravachol 20 mg #30  1 po qhs, 2 refills ---- that tends to have fewer side effects and is still generic It that does not work I would like to try livalo Lipid, cmp -- hyperlipidemia .

## 2016-09-18 NOTE — Telephone Encounter (Signed)
Spoke with pt, pt state she understand results, new medication instruction, and follow up to recheck labs in 3 mths. Labs orders are in and new Rx is sent to pharmacy. Patient had no further questions or concerns at this time. LB

## 2016-09-27 ENCOUNTER — Telehealth: Payer: Self-pay | Admitting: Family Medicine

## 2016-09-27 MED ORDER — MECLIZINE HCL 25 MG PO TABS: 25.0000 mg | ORAL_TABLET | Freq: Three times a day (TID) | ORAL | 0 refills | Status: AC | PRN

## 2016-09-27 NOTE — Telephone Encounter (Signed)
YL-I spoke with patient/she states that she is having problems with dizziness related to her allergies  She is treating with Zyrtec OTC/she is requesting the Meclizine to be filled as in the past/plz advise/thx dmf

## 2016-09-27 NOTE — Telephone Encounter (Signed)
Caller name: Relation to YV:DPBA Call back [place]  Reason for call: pt is needing a new rx that Dr. Etter Sjogren had written for her for dizziness however pt has forgotten the name of the rx, but states that dr. Etter Sjogren wrote it for her

## 2016-09-27 NOTE — Telephone Encounter (Signed)
Meclizine 50 mg 1 po tid prn #30

## 2016-09-27 NOTE — Telephone Encounter (Signed)
Per YL changed to 25mg  and faxed #30 of Meclizine/pt aware/thx dmf

## 2016-11-19 ENCOUNTER — Ambulatory Visit (INDEPENDENT_AMBULATORY_CARE_PROVIDER_SITE_OTHER): Payer: 59

## 2016-11-19 DIAGNOSIS — Z23 Encounter for immunization: Secondary | ICD-10-CM

## 2016-12-23 ENCOUNTER — Other Ambulatory Visit (INDEPENDENT_AMBULATORY_CARE_PROVIDER_SITE_OTHER): Payer: 59

## 2016-12-23 DIAGNOSIS — E7849 Other hyperlipidemia: Secondary | ICD-10-CM

## 2016-12-24 LAB — COMPREHENSIVE METABOLIC PANEL
ALT: 15 U/L (ref 0–35)
AST: 18 U/L (ref 0–37)
Albumin: 4 g/dL (ref 3.5–5.2)
Alkaline Phosphatase: 68 U/L (ref 39–117)
BUN: 13 mg/dL (ref 6–23)
CO2: 30 mEq/L (ref 19–32)
Calcium: 9.4 mg/dL (ref 8.4–10.5)
Chloride: 103 mEq/L (ref 96–112)
Creatinine, Ser: 0.69 mg/dL (ref 0.40–1.20)
GFR: 93.57 mL/min (ref >60.00)
Glucose, Bld: 99 mg/dL (ref 70–99)
Potassium: 3.6 mEq/L (ref 3.5–5.1)
Sodium: 138 mEq/L (ref 135–145)
Total Bilirubin: 0.7 mg/dL (ref 0.2–1.2)
Total Protein: 6.4 g/dL (ref 6.0–8.3)

## 2016-12-24 LAB — LIPID PANEL
Cholesterol: 268 mg/dL — ABNORMAL HIGH (ref 0–200)
HDL: 66.2 mg/dL (ref >39.00)
LDL Cholesterol: 190 mg/dL — ABNORMAL HIGH (ref 0–99)
NonHDL: 201.53
Total CHOL/HDL Ratio: 4
Triglycerides: 59 mg/dL (ref 0.0–149.0)
VLDL: 11.8 mg/dL (ref 0.0–40.0)

## 2016-12-30 ENCOUNTER — Other Ambulatory Visit: Payer: Self-pay | Admitting: Family Medicine

## 2016-12-30 DIAGNOSIS — E785 Hyperlipidemia, unspecified: Secondary | ICD-10-CM

## 2016-12-30 MED ORDER — PITAVASTATIN CALCIUM 2 MG PO TABS: 1.0000 | ORAL_TABLET | Freq: Every day | ORAL | 3 refills | Status: DC

## 2017-01-02 ENCOUNTER — Other Ambulatory Visit: Payer: Self-pay | Admitting: Family Medicine

## 2017-01-02 DIAGNOSIS — Z139 Encounter for screening, unspecified: Secondary | ICD-10-CM

## 2017-01-08 ENCOUNTER — Telehealth: Payer: Self-pay | Admitting: Family Medicine

## 2017-01-08 NOTE — Telephone Encounter (Signed)
Copied from [place] [id number]. Topic: Quick Communication - See Telephone Encounter >> Jan 08, 2017  3:44 PM Boyd Kerbs wrote: CRM for notification. See Telephone encounter for:  Peterson [place] - [phone] Please call for medication  ref # [id number]  (could not give to me) 01/08/17.

## 2017-01-08 NOTE — Telephone Encounter (Signed)
PA initiated via Covermymeds; KEY: [id number]. Awaiting determination.

## 2017-01-08 NOTE — Telephone Encounter (Signed)
Copied from [place]. Topic: Quick Communication - See Telephone Encounter >> Jan 08, 2017 10:49 AM Burnis Medin, NT wrote: CRM for notification. See Telephone encounter for: Pt is calling in to see if the medication can be pre approved so that insurance can pay for it. Medication Pitavastatin Calcium (LIVALO) 2 MG TABS. Pt would like a call back  01/08/17.

## 2017-01-09 ENCOUNTER — Other Ambulatory Visit: Payer: Self-pay | Admitting: Family Medicine

## 2017-01-09 DIAGNOSIS — F411 Generalized anxiety disorder: Secondary | ICD-10-CM

## 2017-01-09 NOTE — Telephone Encounter (Signed)
PA approved effective 01/09/2017 through 01/09/2018.

## 2017-01-09 NOTE — Telephone Encounter (Signed)
[facility]am  Database ran and is on your desk for review.  Last filled per database: 10/23/16 Last written: 09/16/16 Last ov: 09/16/16 Next ov: 09/22/17 Contract: none UDS: none

## 2017-01-10 NOTE — Telephone Encounter (Signed)
We are aware of PA approval.

## 2017-01-10 NOTE — Telephone Encounter (Signed)
Called pharmacy back to get more details. / Medication in question is the Livalo / Per representative, the medication was approved on yesterday / Approved for 12 months. 01-09-17 through 01-09-17 Approval # is [id number]

## 2017-02-03 ENCOUNTER — Ambulatory Visit
Admission: RE | Admit: 2017-02-03 | Discharge: 2017-02-03 | Disposition: A | Discharge status: Home / Self Care | Payer: 59 | Source: Ambulatory Visit | Attending: Family Medicine | Admitting: Family Medicine

## 2017-02-03 DIAGNOSIS — Z139 Encounter for screening, unspecified: Secondary | ICD-10-CM

## 2017-02-28 ENCOUNTER — Other Ambulatory Visit: Payer: Self-pay | Admitting: Family Medicine

## 2017-02-28 ENCOUNTER — Telehealth: Payer: Self-pay | Admitting: Family Medicine

## 2017-02-28 DIAGNOSIS — M1990 Unspecified osteoarthritis, unspecified site: Secondary | ICD-10-CM

## 2017-02-28 MED ORDER — OXYCODONE-ACETAMINOPHEN 5-325 MG PO TABS: 1.0000 | ORAL_TABLET | Freq: Four times a day (QID) | ORAL | 0 refills | Status: DC | PRN

## 2017-02-28 NOTE — Telephone Encounter (Signed)
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  She has been on celebrex, ibuprofen, and oxycodone as needed which has not been working.  Also she thinks the new cholesterol may be causing more pain.   Can you increase med or change something.

## 2017-02-28 NOTE — Telephone Encounter (Signed)
Easiest thing to do is stop livalo and see if she feels better I'll refill pain med

## 2017-02-28 NOTE — Telephone Encounter (Signed)
Copied from [place] [id number]. Topic: Quick Communication - See Telephone Encounter >> Feb 28, 2017 12:47 PM Percell Belt A wrote: CRM for notification. See Telephone encounter for:  pt called in and said that her body is hurting and feels that she needs to increase  meds.  She also needs a refill on her hydrocodone .  She also has questions about her cholesterol meds making things worse    [facility] on [address]    02/28/17.

## 2017-02-28 NOTE — Telephone Encounter (Signed)
Patient notified

## 2017-03-12 ENCOUNTER — Telehealth: Payer: Self-pay | Admitting: Family Medicine

## 2017-03-12 NOTE — Telephone Encounter (Signed)
Copied from [place] [id number]. Topic: Quick Communication - See Telephone Encounter >> Mar 12, 2017  3:42 PM Robina Ade, Helene Kelp D wrote: CRM for notification. See Telephone encounter for: 03/12/17. Patient called and said that she would like to talk to Dr. Carollee Herter or her CMA about her right hip joint. She said she has gone to the chiropractic 2X and would like to know what will be the next step after this. She would like to know if she continue with Chiropractic or have a referral to orthopaedic if necessary. Please call patient back, thanks.

## 2017-03-14 NOTE — Telephone Encounter (Signed)
YL-What would you recommend for this pt? Plz advise/thx dmf

## 2017-03-14 NOTE — Telephone Encounter (Signed)
It can take longer with the chiropractor but I'm happy to refer her to ortho if she would like----ok to put referral in if she agrees

## 2017-03-26 ENCOUNTER — Other Ambulatory Visit: Payer: Self-pay | Admitting: *Deleted

## 2017-03-26 MED ORDER — PITAVASTATIN CALCIUM 2 MG PO TABS: 1.0000 | ORAL_TABLET | Freq: Every day | ORAL | 1 refills | Status: DC

## 2017-03-31 NOTE — Addendum Note (Signed)
Addended by: Caffie Pinto on: 03/31/2017 07:29 AM   Modules accepted: Orders

## 2017-04-01 ENCOUNTER — Other Ambulatory Visit: Payer: 59

## 2017-05-12 ENCOUNTER — Other Ambulatory Visit: Payer: Self-pay | Admitting: Family Medicine

## 2017-05-12 DIAGNOSIS — I10 Essential (primary) hypertension: Secondary | ICD-10-CM

## 2017-08-26 ENCOUNTER — Other Ambulatory Visit: Payer: Self-pay | Admitting: Family Medicine

## 2017-08-26 DIAGNOSIS — Z1231 Encounter for screening mammogram for malignant neoplasm of breast: Secondary | ICD-10-CM

## 2017-09-06 ENCOUNTER — Other Ambulatory Visit: Payer: Self-pay | Admitting: Family Medicine

## 2017-09-06 DIAGNOSIS — M19041 Primary osteoarthritis, right hand: Secondary | ICD-10-CM

## 2017-09-06 DIAGNOSIS — M19042 Primary osteoarthritis, left hand: Principal | ICD-10-CM

## 2017-09-22 ENCOUNTER — Other Ambulatory Visit (HOSPITAL_COMMUNITY)
Admission: RE | Admit: 2017-09-22 | Discharge: 2017-09-22 | Disposition: A | Discharge status: Home / Self Care | Payer: 59 | Source: Ambulatory Visit | Attending: Family Medicine | Admitting: Family Medicine

## 2017-09-22 ENCOUNTER — Encounter: Payer: Self-pay | Admitting: Family Medicine

## 2017-09-22 ENCOUNTER — Ambulatory Visit (INDEPENDENT_AMBULATORY_CARE_PROVIDER_SITE_OTHER): Payer: 59 | Admitting: Family Medicine

## 2017-09-22 VITALS — BP 113/68 | HR 60 | Temp 98.6°F | Resp 16 | Ht 62.8 in | Wt 154.0 lb

## 2017-09-22 DIAGNOSIS — Z124 Encounter for screening for malignant neoplasm of cervix: Secondary | ICD-10-CM

## 2017-09-22 DIAGNOSIS — Z0000 Encounter for general adult medical examination without abnormal findings: Secondary | ICD-10-CM | POA: Insufficient documentation

## 2017-09-22 DIAGNOSIS — M1990 Unspecified osteoarthritis, unspecified site: Secondary | ICD-10-CM

## 2017-09-22 DIAGNOSIS — Z23 Encounter for immunization: Secondary | ICD-10-CM | POA: Diagnosis not present

## 2017-09-22 DIAGNOSIS — I10 Essential (primary) hypertension: Secondary | ICD-10-CM | POA: Insufficient documentation

## 2017-09-22 DIAGNOSIS — R87610 Atypical squamous cells of undetermined significance on cytologic smear of cervix (ASC-US): Secondary | ICD-10-CM | POA: Insufficient documentation

## 2017-09-22 NOTE — Patient Instructions (Signed)
Preventive Care 40-64 Years, Female Preventive care refers to lifestyle choices and visits with your health care provider that can promote health and wellness. What does preventive care include?  A yearly physical exam. This is also called an annual well check.  Dental exams once or twice a year.  Routine eye exams. Ask your health care provider how often you should have your eyes checked.  Personal lifestyle choices, including: ? Daily care of your teeth and gums. ? Regular physical activity. ? Eating a healthy diet. ? Avoiding tobacco and drug use. ? Limiting alcohol use. ? Practicing safe sex. ? Taking low-dose aspirin daily starting at age 58. ? Taking vitamin and mineral supplements as recommended by your health care provider. What happens during an annual well check? The services and screenings done by your health care provider during your annual well check will depend on your age, overall health, lifestyle risk factors, and family history of disease. Counseling Your health care provider may ask you questions about your:  Alcohol use.  Tobacco use.  Drug use.  Emotional well-being.  Home and relationship well-being.  Sexual activity.  Eating habits.  Work and work Statistician.  Method of birth control.  Menstrual cycle.  Pregnancy history.  Screening You may have the following tests or measurements:  Height, weight, and BMI.  Blood pressure.  Lipid and cholesterol levels. These may be checked every 5 years, or more frequently if you are over 81 years old.  Skin check.  Lung cancer screening. You may have this screening every year starting at age 78 if you have a 30-pack-year history of smoking and currently smoke or have quit within the past 15 years.  Fecal occult blood test (FOBT) of the stool. You may have this test every year starting at age 65.  Flexible sigmoidoscopy or colonoscopy. You may have a sigmoidoscopy every 5 years or a colonoscopy  every 10 years starting at age 30.  Hepatitis C blood test.  Hepatitis B blood test.  Sexually transmitted disease (STD) testing.  Diabetes screening. This is done by checking your blood sugar (glucose) after you have not eaten for a while (fasting). You may have this done every 1-3 years.  Mammogram. This may be done every 1-2 years. Talk to your health care provider about when you should start having regular mammograms. This may depend on whether you have a family history of breast cancer.  BRCA-related cancer screening. This may be done if you have a family history of breast, ovarian, tubal, or peritoneal cancers.  Pelvic exam and Pap test. This may be done every 3 years starting at age 80. Starting at age 36, this may be done every 5 years if you have a Pap test in combination with an HPV test.  Bone density scan. This is done to screen for osteoporosis. You may have this scan if you are at high risk for osteoporosis.  Discuss your test results, treatment options, and if necessary, the need for more tests with your health care provider. Vaccines Your health care provider may recommend certain vaccines, such as:  Influenza vaccine. This is recommended every year.  Tetanus, diphtheria, and acellular pertussis (Tdap, Td) vaccine. You may need a Td booster every 10 years.  Varicella vaccine. You may need this if you have not been vaccinated.  Zoster vaccine. You may need this after age 5.  Measles, mumps, and rubella (MMR) vaccine. You may need at least one dose of MMR if you were born in  1957 or later. You may also need a second dose.  Pneumococcal 13-valent conjugate (PCV13) vaccine. You may need this if you have certain conditions and were not previously vaccinated.  Pneumococcal polysaccharide (PPSV23) vaccine. You may need one or two doses if you smoke cigarettes or if you have certain conditions.  Meningococcal vaccine. You may need this if you have certain  conditions.  Hepatitis A vaccine. You may need this if you have certain conditions or if you travel or work in places where you may be exposed to hepatitis A.  Hepatitis B vaccine. You may need this if you have certain conditions or if you travel or work in places where you may be exposed to hepatitis B.  Haemophilus influenzae type b (Hib) vaccine. You may need this if you have certain conditions.  Talk to your health care provider about which screenings and vaccines you need and how often you need them. This information is not intended to replace advice given to you by your health care provider. Make sure you discuss any questions you have with your health care provider. Document Released: 02/17/2015 Document Revised: 10/11/2015 Document Reviewed: 11/22/2014 Elsevier Interactive Patient Education  2018 Elsevier Inc.  

## 2017-09-22 NOTE — Progress Notes (Signed)
Patient ID: Sonya [name], female    DOB: [birth year]  Age: 57 y.o. MRN: [id number]    Subjective:  Subjective  HPI Sonya [name] presents for cpe and pap with labs.  Pt c/o worsening pain in her hands and all other joints.  She never went to the rheumatologist but is willing to go now.    Review of Systems  Constitutional: Negative for chills and fever.  HENT: Negative for congestion and hearing loss.   Eyes: Negative for discharge.  Respiratory: Negative for cough and shortness of breath.   Cardiovascular: Negative for chest pain, palpitations and leg swelling.  Gastrointestinal: Negative for abdominal pain, blood in stool, constipation, diarrhea, nausea and vomiting.  Genitourinary: Negative for dysuria, frequency, hematuria and urgency.  Musculoskeletal: Positive for arthralgias, back pain, joint swelling and myalgias.  Skin: Negative for rash.  Allergic/Immunologic: Negative for environmental allergies.  Neurological: Negative for dizziness, weakness and headaches.  Hematological: Does not bruise/bleed easily.  Psychiatric/Behavioral: Negative for suicidal ideas. The patient is not nervous/anxious.     History Past Medical History:  Diagnosis Date  . Arthritis   . Fibromyalgia   . Hypertension     She has a past surgical history that includes Cesarean section (1987).   Her family history includes Alcohol abuse in her sister; Arthritis in her father and mother; Diabetes in her mother; Heart attack in her brother; Heart failure in her father; Hyperlipidemia in her father and mother; Hypertension in her father and mother; Lung cancer in her paternal grandfather; Macular degeneration in her father; Prostate cancer in her father; Stomach cancer in her maternal grandmother; Stroke in her father; Subarachnoid hemorrhage in her sister.She reports that she has never smoked. She has never used smokeless tobacco. She reports that she does not drink alcohol or use drugs.  Current  Outpatient Medications on File Prior to Visit  Medication Sig Dispense Refill  . ALPRAZolam (XANAX) 0.5 MG tablet TAKE 1 TABLET BY MOUTH 3 TIMES A DAY AS NEEDED 30 tablet 2  . Ascorbic Acid (VITAMIN C) 1000 MG tablet Take 1,000 mg by mouth daily.    Marland Kitchen aspirin EC 81 MG tablet Take 81 mg by mouth daily.    Marland Kitchen azelastine (ASTELIN) 0.1 % nasal spray Place 2 sprays into both nostrils 2 (two) times daily. Use in each nostril as directed 30 mL 3  . Biotin 1000 MCG tablet Take 1,000 mcg by mouth 3 (three) times daily.    . celecoxib (CELEBREX) 200 MG capsule Take 1 capsule (200 mg total) by mouth 2 (two) times daily. 30 capsule 5  . [facility] OMEGA-3 KRILL OIL 500 MG CAPS Take 1 capsule by mouth daily.    . diclofenac sodium (VOLTAREN) 1 % GEL APPLY 1 APPLICATION TOPICALLY 4 (FOUR) TIMES DAILY AS NEEDED.*NOT COVERED* 100 g 2  . hydrochlorothiazide (HYDRODIURIL) 25 MG tablet TAKE 1 TABLET BY MOUTH EVERY DAY 90 tablet 1  . ibuprofen (ADVIL,MOTRIN) 600 MG tablet Take 1 tablet (600 mg total) by mouth every 6 (six) hours as needed. 30 tablet 0  . meclizine (ANTIVERT) 25 MG tablet Take 1 tablet (25 mg total) by mouth 3 (three) times daily as needed for dizziness. 30 tablet 0  . Multiple Vitamin (MULTIVITAMIN) tablet Take 1 tablet by mouth daily.    . Multiple Vitamins-Minerals (EQ VISION FORMULA 50+) CAPS Take by mouth.    . oxyCODONE-acetaminophen (PERCOCET/ROXICET) 5-325 MG tablet Take 1 tablet by mouth every 6 (six) hours as needed for moderate pain  or severe pain. 30 tablet 0  . Turmeric, Curcuma Longa, (CURCUMIN EXTRACT) POWD Take 48 mg by mouth daily.     No current facility-administered medications on file prior to visit.      Objective:  Objective  Physical Exam  Constitutional: She is oriented to person, place, and time. She appears well-developed and well-nourished.  HENT:  Head: Normocephalic and atraumatic.  Eyes: Conjunctivae and EOM are normal.  Neck: Normal range of motion. Neck supple. No JVD  present. Carotid bruit is not present. No thyromegaly present.  Cardiovascular: Normal rate, regular rhythm and normal heart sounds.  No murmur heard. Pulmonary/Chest: Effort normal and breath sounds normal. No respiratory distress. She has no wheezes. She has no rales. She exhibits no tenderness.  Musculoskeletal: She exhibits no edema.  Neurological: She is alert and oriented to person, place, and time.  Psychiatric: She has a normal mood and affect.  Nursing note and vitals reviewed.  BP 113/68 (BP Location: Right Arm, Cuff Size: Normal)   Pulse 60   Temp 98.6 F (37 C) (Oral)   Resp 16   Ht 5' 2.8" (1.595 m)   Wt 154 lb (69.9 kg)   LMP 02/23/2016   SpO2 98%   BMI 27.45 kg/m  Wt Readings from Last 3 Encounters:  09/22/17 154 lb (69.9 kg)  09/16/16 156 lb 2 oz (70.8 kg)  05/14/16 149 lb 6.4 oz (67.8 kg)     Lab Results  Component Value Date   WBC 3.4 (L) 09/22/2017   HGB 14.9 09/22/2017   HCT 44.3 09/22/2017   PLT 192.0 09/22/2017   GLUCOSE 96 09/22/2017   CHOL 324 (H) 09/22/2017   TRIG 77.0 09/22/2017   HDL 61.50 09/22/2017   LDLCALC 247 (H) 09/22/2017   ALT 11 09/22/2017   AST 14 09/22/2017   NA 139 09/22/2017   K 3.3 (L) 09/22/2017   CL 101 09/22/2017   CREATININE 0.81 09/22/2017   BUN 14 09/22/2017   CO2 30 09/22/2017   TSH 1.65 09/22/2017    Mm Screening Breast Tomo Bilateral  Result Date: 02/05/2017 CLINICAL DATA:  Screening. EXAM: 2D DIGITAL SCREENING BILATERAL MAMMOGRAM WITH CAD AND ADJUNCT TOMO COMPARISON:  Previous exam(s). ACR Breast Density Category d: The breast tissue is extremely dense, which lowers the sensitivity of mammography. FINDINGS: There are no findings suspicious for malignancy. Images were processed with CAD. IMPRESSION: No mammographic evidence of malignancy. A result letter of this screening mammogram will be mailed directly to the patient. RECOMMENDATION: Screening mammogram in one year. (Code:SM-B-01Y) BI-RADS CATEGORY  1: Negative.  Electronically Signed   By: Curlene Dolphin M.D.   On: 02/05/2017 09:41     Assessment & Plan:  Plan  I have discontinued Bertina L. Althouse's SUMAtriptan and Pitavastatin Calcium. I am also having her maintain her ibuprofen, vitamin C, Curcumin Extract, azelastine, Biotin, aspirin EC, multivitamin, CVS OMEGA-3 KRILL OIL, EQ VISION FORMULA 50+, celecoxib, meclizine, ALPRAZolam, oxyCODONE-acetaminophen, hydrochlorothiazide, and diclofenac sodium.  No orders of the defined types were placed in this encounter.   Problem List Items Addressed This Visit      Unprioritized   Arthritis    Worsening symptoms Refer to rheum      Relevant Orders   Rheumatoid Factor (Completed)   Ambulatory referral to Rheumatology   Essential hypertension    Well controlled, no changes to meds. Encouraged heart healthy diet such as the DASH diet and exercise as tolerated.        Relevant Orders  CBC with Differential/Platelet (Completed)   Comprehensive metabolic panel (Completed)   Lipid panel (Completed)   TSH (Completed)   CBC with Differential/Platelet   TSH   Lipid panel   Comprehensive metabolic panel   Preventative health care - Primary    ghm utd See avs Check labs      Relevant Orders   Ambulatory referral to Gastroenterology   CBC with Differential/Platelet   TSH   Lipid panel   Comprehensive metabolic panel   Rheumatoid Factor (Completed)    Other Visit Diagnoses    Cervical cancer screening       Relevant Orders   Cytology - PAP (Completed)   Need for shingles vaccine       Relevant Orders   Varicella-zoster vaccine IM (Shingrix) (Completed)      Follow-up: Return in about 1 year (around 09/23/2018), or if symptoms worsen or fail to improve, for annual exam, fasting.  [name], DO

## 2017-09-23 LAB — COMPREHENSIVE METABOLIC PANEL
ALT: 11 U/L (ref 0–35)
AST: 14 U/L (ref 0–37)
Albumin: 4.2 g/dL (ref 3.5–5.2)
Alkaline Phosphatase: 73 U/L (ref 39–117)
BUN: 14 mg/dL (ref 6–23)
CO2: 30 mEq/L (ref 19–32)
Calcium: 9.5 mg/dL (ref 8.4–10.5)
Chloride: 101 mEq/L (ref 96–112)
Creatinine, Ser: 0.81 mg/dL (ref 0.40–1.20)
GFR: 77.56 mL/min (ref >60.00)
Glucose, Bld: 96 mg/dL (ref 70–99)
Potassium: 3.3 mEq/L — ABNORMAL LOW (ref 3.5–5.1)
Sodium: 139 mEq/L (ref 135–145)
Total Bilirubin: 0.9 mg/dL (ref 0.2–1.2)
Total Protein: 6.5 g/dL (ref 6.0–8.3)

## 2017-09-23 LAB — CBC WITH DIFFERENTIAL/PLATELET
Basophils Absolute: 0 10*3/uL (ref 0.0–0.1)
Basophils Relative: 0.9 % (ref 0.0–3.0)
Eosinophils Absolute: 0.1 10*3/uL (ref 0.0–0.7)
Eosinophils Relative: 3.1 % (ref 0.0–5.0)
HCT: 44.3 % (ref 36.0–46.0)
Hemoglobin: 14.9 g/dL (ref 12.0–15.0)
Lymphocytes Relative: 28.3 % (ref 12.0–46.0)
Lymphs Abs: 0.9 10*3/uL (ref 0.7–4.0)
MCHC: 33.5 g/dL (ref 30.0–36.0)
MCV: 90.3 fl (ref 78.0–100.0)
Monocytes Absolute: 0.2 10*3/uL (ref 0.1–1.0)
Monocytes Relative: 7.1 % (ref 3.0–12.0)
Neutro Abs: 2 10*3/uL (ref 1.4–7.7)
Neutrophils Relative %: 60.6 % (ref 43.0–77.0)
Platelets: 192 10*3/uL (ref 150.0–400.0)
RBC: 4.91 Mil/uL (ref 3.87–5.11)
RDW: 13.2 % (ref 11.5–15.5)
WBC: 3.4 10*3/uL — ABNORMAL LOW (ref 4.0–10.5)

## 2017-09-23 LAB — LIPID PANEL
Cholesterol: 324 mg/dL — ABNORMAL HIGH (ref 0–200)
HDL: 61.5 mg/dL (ref >39.00)
LDL Cholesterol: 247 mg/dL — ABNORMAL HIGH (ref 0–99)
NonHDL: 262.56
Total CHOL/HDL Ratio: 5
Triglycerides: 77 mg/dL (ref 0.0–149.0)
VLDL: 15.4 mg/dL (ref 0.0–40.0)

## 2017-09-23 LAB — TSH: TSH: 1.65 u[IU]/mL (ref 0.35–4.50)

## 2017-09-23 LAB — RHEUMATOID FACTOR: Rhuematoid fact SerPl-aCnc: <14 IU/mL (ref <14)

## 2017-09-24 LAB — CYTOLOGY - PAP
Diagnosis: UNDETERMINED — AB
HPV: NOT DETECTED

## 2017-09-26 ENCOUNTER — Other Ambulatory Visit: Payer: Self-pay

## 2017-09-26 DIAGNOSIS — I10 Essential (primary) hypertension: Secondary | ICD-10-CM

## 2017-09-26 DIAGNOSIS — E785 Hyperlipidemia, unspecified: Secondary | ICD-10-CM

## 2017-09-26 MED ORDER — ATORVASTATIN CALCIUM 20 MG PO TABS: 20.0000 mg | ORAL_TABLET | Freq: Every day | ORAL | 2 refills | Status: DC

## 2017-09-28 DIAGNOSIS — M1990 Unspecified osteoarthritis, unspecified site: Secondary | ICD-10-CM | POA: Insufficient documentation

## 2017-09-28 DIAGNOSIS — Z0000 Encounter for general adult medical examination without abnormal findings: Secondary | ICD-10-CM | POA: Insufficient documentation

## 2017-09-28 NOTE — Assessment & Plan Note (Signed)
ghm utd °See avs °Check labs  °

## 2017-09-28 NOTE — Assessment & Plan Note (Signed)
Worsening symptoms Refer to rheum

## 2017-09-28 NOTE — Assessment & Plan Note (Signed)
Well controlled, no changes to meds. Encouraged heart healthy diet such as the DASH diet and exercise as tolerated.  °

## 2017-10-01 ENCOUNTER — Telehealth: Payer: Self-pay | Admitting: Family Medicine

## 2017-10-01 DIAGNOSIS — E785 Hyperlipidemia, unspecified: Secondary | ICD-10-CM

## 2017-10-01 NOTE — Telephone Encounter (Signed)
Pt returned call for lab results. Lab message and recommendation given to patient with verbal understanding. Pt stated that she can not take Lipitor. she has that already at home. She also has tried to take pravastatin and pitavastatin calcium (Livalo) and can not take them also.  If she has to take one, she would like to try the pitavastatin again. She is also wondering if she could try the cholesterol clinic on [facility] if that would be a help to her.  She is requesting a call back please. Appointment not scheduled for recheck for blood work. No result note found.

## 2017-10-07 NOTE — Telephone Encounter (Signed)
Ok to refer to lipid clinic-- try livalo 1 mg daily

## 2017-10-07 NOTE — Telephone Encounter (Signed)
Are you ok with this? 

## 2017-10-08 NOTE — Addendum Note (Signed)
Addended by: Kem Boroughs D on: 10/08/2017 11:36 AM   Modules accepted: Orders

## 2017-10-08 NOTE — Telephone Encounter (Signed)
Referral placed.  Patient stated that she has been taking the 2mg  of livalo with some leg cramps.

## 2017-10-29 ENCOUNTER — Telehealth: Payer: Self-pay | Admitting: Family Medicine

## 2017-10-29 NOTE — Telephone Encounter (Signed)
Spoke w/ Pt- informed 2nd Shingrix would be due 2-6 months after first. We scheduled nurse visit for flu shot and Shingrix #2 for 10/21.

## 2017-10-29 NOTE — Telephone Encounter (Signed)
Copied from [place] [id number]. Topic: General - Other >> Oct 29, 2017 12:00 PM Cecelia Byars, NT wrote: Reason for CRM: Patient called and states she received the first shingrix on 09/22/17 and would like to know when does she get he next dose  please advise ,[phone]

## 2017-11-06 ENCOUNTER — Other Ambulatory Visit: Payer: Self-pay | Admitting: Family Medicine

## 2017-11-06 DIAGNOSIS — I10 Essential (primary) hypertension: Secondary | ICD-10-CM

## 2017-11-19 ENCOUNTER — Other Ambulatory Visit: Payer: Self-pay | Admitting: Family Medicine

## 2017-11-19 DIAGNOSIS — M19041 Primary osteoarthritis, right hand: Secondary | ICD-10-CM

## 2017-11-19 DIAGNOSIS — M19042 Primary osteoarthritis, left hand: Principal | ICD-10-CM

## 2017-11-24 ENCOUNTER — Ambulatory Visit (INDEPENDENT_AMBULATORY_CARE_PROVIDER_SITE_OTHER): Payer: 59

## 2017-11-24 DIAGNOSIS — Z23 Encounter for immunization: Secondary | ICD-10-CM

## 2017-12-05 ENCOUNTER — Telehealth: Payer: Self-pay | Admitting: *Deleted

## 2017-12-05 DIAGNOSIS — Z8249 Family history of ischemic heart disease and other diseases of the circulatory system: Secondary | ICD-10-CM

## 2017-12-05 NOTE — Telephone Encounter (Signed)
Copied from [place] [id number]. Topic: Referral - Request for Referral >> Dec 05, 2017  1:41 PM Judyann Munson wrote: Has patient seen PCP for this complaint? Yes  Referral for which specialty:Cardiology  Preferred provider/office: [facility]) Reason for referral:  CT calcium score - History of heart issue in family)

## 2017-12-09 NOTE — Telephone Encounter (Signed)
Referral placed.

## 2017-12-10 ENCOUNTER — Telehealth: Payer: Self-pay | Admitting: Internal Medicine

## 2017-12-10 NOTE — Telephone Encounter (Signed)
LVM for patient to call back to reschedule her 12/25/17 appt to a lipid appt with Dr. Debara Pickett

## 2017-12-25 ENCOUNTER — Ambulatory Visit: Payer: 59

## 2017-12-26 ENCOUNTER — Encounter: Payer: Self-pay | Admitting: Internal Medicine

## 2017-12-26 ENCOUNTER — Ambulatory Visit: Payer: 59 | Admitting: Internal Medicine

## 2017-12-26 VITALS — BP 139/88 | HR 55 | Ht 63.0 in | Wt 157.2 lb

## 2017-12-26 DIAGNOSIS — I10 Essential (primary) hypertension: Secondary | ICD-10-CM

## 2017-12-26 DIAGNOSIS — Z8249 Family history of ischemic heart disease and other diseases of the circulatory system: Secondary | ICD-10-CM | POA: Diagnosis not present

## 2017-12-26 DIAGNOSIS — Z789 Other specified health status: Secondary | ICD-10-CM | POA: Diagnosis not present

## 2017-12-26 DIAGNOSIS — E7849 Other hyperlipidemia: Secondary | ICD-10-CM | POA: Diagnosis not present

## 2017-12-26 NOTE — Patient Instructions (Signed)
Medication Instructions:  Dr. Debara Pickett recommends Repatha(PCSK9). This is an injectable cholesterol medication. This medication will need prior approval with your insurance company, which we will work on. If the medication is not approved initially, we may need to do an appeal with your insurance. We will keep you updated on this process. This medication can be provided at some local pharmacies or be shipped to you from a specialty pharmacy.   If you need a refill on your cardiac medications before your next appointment, please call your pharmacy.   Lab work: FASTING lab work in 3-4 months to recheck cholesterol (prior to next visit with Dr. Debara Pickett) If you have labs (blood work) drawn today and your tests are completely normal, you will receive your results only by: Marland Kitchen MyChart Message (if you have MyChart) OR . A paper copy in the mail If you have any lab test that is abnormal or we need to change your treatment, we will call you to review the results.  Testing/Procedures: Dr. Debara Pickett has ordered a CT coronary calcium score. This test is done at [address]. This is $150 out of pocket.   Coronary CalciumScan A coronary calcium scan is an imaging test used to look for deposits of calcium and other fatty materials (plaques) in the inner lining of the blood vessels of the heart (coronary arteries). These deposits of calcium and plaques can partly clog and narrow the coronary arteries without producing any symptoms or warning signs. This puts a person at risk for a heart attack. This test can detect these deposits before symptoms develop. Tell a health care provider about:  Any allergies you have.  All medicines you are taking, including vitamins, herbs, eye drops, creams, and over-the-counter medicines.  Any problems you or family members have had with anesthetic medicines.  Any blood disorders you have.  Any surgeries you have had.  Any medical conditions you have.  Whether  you are pregnant or may be pregnant. What are the risks? Generally, this is a safe procedure. However, problems may occur, including:  Harm to a pregnant woman and her unborn baby. This test involves the use of radiation. Radiation exposure can be dangerous to a pregnant woman and her unborn baby. If you are pregnant, you generally should not have this procedure done.  Slight increase in the risk of cancer. This is because of the radiation involved in the test. What happens before the procedure? No preparation is needed for this procedure. What happens during the procedure?  You will undress and remove any jewelry around your neck or chest.  You will put on a hospital gown.  Sticky electrodes will be placed on your chest. The electrodes will be connected to an electrocardiogram (ECG) machine to record a tracing of the electrical activity of your heart.  A CT scanner will take pictures of your heart. During this time, you will be asked to lie still and hold your breath for 2-3 seconds while a picture of your heart is being taken. The procedure may vary among health care providers and hospitals. What happens after the procedure?  You can get dressed.  You can return to your normal activities.  It is up to you to get the results of your test. Ask your health care provider, or the department that is doing the test, when your results will be ready. Summary  A coronary calcium scan is an imaging test used to look for deposits of calcium and other fatty  materials (plaques) in the inner lining of the blood vessels of the heart (coronary arteries).  Generally, this is a safe procedure. Tell your health care provider if you are pregnant or may be pregnant.  No preparation is needed for this procedure.  A CT scanner will take pictures of your heart.  You can return to your normal activities after the scan is done. This information is not intended to replace advice given to you by your  health care provider. Make sure you discuss any questions you have with your health care provider. Document Released: 07/20/2007 Document Revised: 12/11/2015 Document Reviewed: 12/11/2015 Elsevier Interactive Patient Education  2017 [place]: At [facility], you and your health needs are our priority.  As part of our continuing mission to provide you with exceptional heart care, we have created designated Provider Care Teams.  These Care Teams include your primary Cardiologist (physician) and Advanced Practice Providers (APPs -  Physician Assistants and Nurse Practitioners) who all work together to provide you with the care you need, when you need it. You will need a follow up appointment in 3-4 months with Dr. Debara Pickett (lipid clinic)  Any Other Special Instructions Will Be Listed Below (If Applicable).  You have been referred to Dr. Lattie Corns (geneticist).  She sees patients at [address] - [address]rd Floor

## 2017-12-26 NOTE — Progress Notes (Addendum)
LIPID CLINIC CONSULT NOTE  Chief Complaint:  Hyperlipidemia, statin intolerance  Primary Care Physician: Sonya [name], Sonya Apa, DO  HPI:  Sonya [name] is a 57 y.o. female who is being seen today for the evaluation of hyperlipidemia and statin intolerance at the request of Sonya [name], *. This is a very pleasant 57 yo female with a long-standing history of dyslipidemia.  She was initially noted to have elevated cholesterol in her 64s.  She says she is been on intermittent treatment since then but has had significant side effects on multiple statin medications including atorvastatin, pravastatin and most recently Livalo.  All of which caused joint pain and aches as well as recently flulike symptoms with Livalo.  She does have a family history of coronary disease in her mom who had aortic stenosis in her father who had a artery disease and congestive heart failure.  Her brother died of an MI at age 62.  She also has 2 children ages 60 and 69, neither of which have had cholesterol testing.  Her most recent lipid profile showed total cholesterol 324, glycerides 77, HDL 61 and LDL of 247.  In addition she has some hypertension and arthritis but negative markers for RA.  She has no known coronary disease.  She does have a history of moderate obesity but underwent significant weight loss and is made dietary changes and exercises regularly.  She is asymptomatic denying any chest pain or worsening shortness of breath.  She is a lifelong non-smoker and denies any alcohol or drug use.  Reports a low-cholesterol diet which is low in saturated fats.  PMHx:  Past Medical History:  Diagnosis Date  . Arthritis   . Fibromyalgia   . Hypertension     Past Surgical History:  Procedure Laterality Date  . CESAREAN SECTION  1987    FAMHx:  Family History  Problem Relation Age of Onset  . Arthritis Mother   . Hyperlipidemia Mother   . Hypertension Mother   . Diabetes Mother   . Arthritis  Father   . Hyperlipidemia Father   . Heart failure Father        Hx disease  . Hypertension Father   . Stroke Father   . Prostate cancer Father   . Macular degeneration Father   . Lung cancer Paternal Grandfather        smoker  . Stomach cancer Maternal Grandmother   . Alcohol abuse Sister   . Subarachnoid hemorrhage Sister   . Heart attack Brother     SOCHx:   reports that she has never smoked. She has never used smokeless tobacco. She reports that she does not drink alcohol or use drugs.  ALLERGIES:  Allergies  Allergen Reactions  . Atorvastatin Other (See Comments)    Leg cramps    ROS: Pertinent items noted in HPI and remainder of comprehensive ROS otherwise negative.  HOME MEDS: Current Outpatient Medications on File Prior to Visit  Medication Sig Dispense Refill  . ALPRAZolam (XANAX) 0.5 MG tablet TAKE 1 TABLET BY MOUTH 3 TIMES A DAY AS NEEDED 30 tablet 2  . Ascorbic Acid (VITAMIN C) 1000 MG tablet Take 1,000 mg by mouth daily.    Marland Kitchen aspirin EC 81 MG tablet Take 81 mg by mouth daily.    Marland Kitchen azelastine (ASTELIN) 0.1 % nasal spray Place 2 sprays into both nostrils 2 (two) times daily. Use in each nostril as directed 30 mL 3  . Biotin 1000 MCG  tablet Take 1,000 mcg by mouth 3 (three) times daily.    . celecoxib (CELEBREX) 200 MG capsule Take 1 capsule (200 mg total) by mouth 2 (two) times daily as needed for moderate pain. 30 capsule 1  . [facility] OMEGA-3 KRILL OIL 500 MG CAPS Take 1 capsule by mouth daily.    . diclofenac sodium (VOLTAREN) 1 % GEL APPLY 1 APPLICATION TOPICALLY 4 (FOUR) TIMES DAILY AS NEEDED.*NOT COVERED* 100 g 2  . hydrochlorothiazide (HYDRODIURIL) 25 MG tablet TAKE 1 TABLET BY MOUTH EVERY DAY 90 tablet 1  . ibuprofen (ADVIL,MOTRIN) 600 MG tablet Take 1 tablet (600 mg total) by mouth every 6 (six) hours as needed. 30 tablet 0  . meclizine (ANTIVERT) 25 MG tablet Take 1 tablet (25 mg total) by mouth 3 (three) times daily as needed for dizziness. 30 tablet 0  .  Multiple Vitamin (MULTIVITAMIN) tablet Take 1 tablet by mouth daily.    . Multiple Vitamins-Minerals (EQ VISION FORMULA 50+) CAPS Take by mouth.    . Turmeric, Curcuma Longa, (CURCUMIN EXTRACT) POWD Take 48 mg by mouth daily.     No current facility-administered medications on file prior to visit.     LABS/IMAGING: No results found for this or any previous visit (from the past 48 hour(s)). No results found.  LIPID PANEL:    Component Value Date/Time   CHOL 324 (H) 09/22/2017 1446   TRIG 77.0 09/22/2017 1446   HDL 61.50 09/22/2017 1446   CHOLHDL 5 09/22/2017 1446   VLDL 15.4 09/22/2017 1446   LDLCALC 247 (H) 09/22/2017 1446    WEIGHTS: Wt Readings from Last 3 Encounters:  12/26/17 157 lb 3.2 oz (71.3 kg)  09/22/17 154 lb (69.9 kg)  09/16/16 156 lb 2 oz (70.8 kg)    VITALS: BP 139/88   Pulse (!) 55   Ht 5\' 3"  (1.6 m)   Wt 157 lb 3.2 oz (71.3 kg)   LMP 02/23/2016   BMI 27.85 kg/m   EXAM: General appearance: alert and no distress Neck: no carotid bruit, no JVD and thyroid not enlarged, symmetric, no tenderness/mass/nodules Lungs: clear to auscultation bilaterally Heart: regular rate and rhythm Abdomen: soft, non-tender; bowel sounds normal; no masses,  no organomegaly Extremities: extremities normal, atraumatic, no cyanosis or edema Pulses: 2+ and symmetric Skin: Skin color, texture, turgor normal. No rashes or lesions Neurologic: Grossly normal Psych: Pleasant  EKG: deferred  ASSESSMENT: 1. 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 Family history of premature onset coronary disease 3. Statin intolerance  PLAN: 1.   Sonya [name] has probable FH with the [place] score of 6 and a strong family history of premature onset coronary disease.  She is had elevated cholesterol since her 38s and despite significant weight loss and dietary changes has not been able to get her cholesterol much lower than her current numbers.  Unfortunately she is statin intolerant and is tried several  statin medications including high intensity statin (atorvastatin), suffered from worsening myalgias.  She is a good candidate for PCSK9 inhibitor and I recommend pursuing Repatha 150 mg twice monthly.  In addition I would recommend genetic testing as she has 2 children around age 21 who have not previously had either screening cholesterols or evaluation for other cardiovascular risk.  Finally, I recommend coronary artery calcium scoring to determine if she is had any evidence for premature onset coronary disease.  Plan follow-up with me afterwards.  We will repeat a lipid profile in about 3 to 4 months after starting therapy with  a goal LDL of 50% reduction or if achievable to lower than 70 mg/dL.  Thanks for the kind referral.  Pixie Casino, MD, FACC, [place] Director of the Advanced Lipid Disorders &  Cardiovascular Risk Reduction Clinic Diplomate of the American Board of Clinical Lipidology Attending Cardiologist  Direct Dial: [phone]  Fax: [phone]  Website:  [email or web address] 12/26/2017, 12:36 PM

## 2017-12-29 ENCOUNTER — Telehealth: Payer: Self-pay | Admitting: Internal Medicine

## 2017-12-29 NOTE — Telephone Encounter (Signed)
PA for Repatha [employer] submitted via [email or web address]  (Key: [id number])

## 2018-01-05 ENCOUNTER — Ambulatory Visit: Payer: 59

## 2018-01-06 NOTE — Telephone Encounter (Signed)
Patient has been denied coverage for Repatha - medical necessity criteria were not met. A formal denial letter should be sent to our office - not yet received.   A peer to peer can be completed within 14 days - phone: [phone] Reference #: [id number]

## 2018-01-14 ENCOUNTER — Ambulatory Visit (INDEPENDENT_AMBULATORY_CARE_PROVIDER_SITE_OTHER)
Admission: RE | Admit: 2018-01-14 | Discharge: 2018-01-14 | Disposition: A | Discharge status: Home / Self Care | Payer: Self-pay | Source: Ambulatory Visit | Attending: Internal Medicine | Admitting: Internal Medicine

## 2018-01-14 DIAGNOSIS — E7849 Other hyperlipidemia: Secondary | ICD-10-CM

## 2018-01-14 DIAGNOSIS — Z8249 Family history of ischemic heart disease and other diseases of the circulatory system: Secondary | ICD-10-CM

## 2018-01-16 ENCOUNTER — Telehealth: Payer: Self-pay | Admitting: Internal Medicine

## 2018-01-16 NOTE — Telephone Encounter (Signed)
Called for expedited appeal - [phone], left message - 01/16/2018 at 1:35 pm.  Dr. Lemmie Evens

## 2018-01-16 NOTE — Telephone Encounter (Signed)
Follow Up: ° ° ° °Returning your call from this morning. °

## 2018-01-16 NOTE — Telephone Encounter (Signed)
Spoke with patient about CT calcium score results. Explained that risk factor modification is very important and that unfortunately her insurance denied Repatha initially. Explained an appeal for this can be initiated with new info that she does have coronary calcifications based on test findings. Explained that MD would like her to f/up based on test findings, but she states she has a $75 co-pay and would prefer to wait until after holidays/after medication approval.   Will route to MD as her insurance requires a peer to peer review for appeals A peer to peer can be completed within 14 days (cut off date 01/20/18)  Phone: [phone] Reference #: [id number]

## 2018-01-21 NOTE — Telephone Encounter (Signed)
I called -they said there was going to be an expedited appeal, but no one called back.  Dr. Lemmie Evens

## 2018-01-23 ENCOUNTER — Telehealth: Payer: Self-pay | Admitting: Pharmacist

## 2018-01-23 NOTE — Telephone Encounter (Signed)
Please fax clinical information for appeal at [phone] before Dec/24/2019.  If no information is faxed by Dec/24, appeal qill be denied.

## 2018-01-26 ENCOUNTER — Encounter: Payer: Self-pay | Admitting: Internal Medicine

## 2018-01-26 NOTE — Telephone Encounter (Signed)
[name], Sonya, [name] at 01/23/2018 5:03 PM   Status: Signed    Please fax clinical information for appeal at [phone] before Dec/24/2019.  If no information is faxed by Dec/24, appeal qill be denied.     Appeal letter composed. Faxed this with denial note (no formal letter received), MD office note, lab results, coronary calcium score test results to # provided.

## 2018-01-30 ENCOUNTER — Other Ambulatory Visit: Payer: Self-pay | Admitting: Family Medicine

## 2018-01-30 DIAGNOSIS — F411 Generalized anxiety disorder: Secondary | ICD-10-CM

## 2018-01-30 MED ORDER — EVOLOCUMAB 140 MG/ML ~~LOC~~ SOAJ: 1.0000 | SUBCUTANEOUS | 11 refills | Status: DC

## 2018-01-30 NOTE — Telephone Encounter (Signed)
Received approval fax notice for Repatha [employer] for patient from 01/27/18 - 07/29/2018 Ref #: [id number]

## 2018-01-30 NOTE — Telephone Encounter (Signed)
Spoke with patient and notified her that based on appeals info submitted, Repatha has been approved until July 29, 2018. She was advised to download a co-pay for the medication (link sent via [place]) and [facility].

## 2018-02-02 NOTE — Telephone Encounter (Signed)
Database ran and is on your desk for review.  Last filled per database: 06/06/17 Last written: 01/09/17 Last ov: 09/22/17 Next ov: 09/28/18 Contract: none UDS: none

## 2018-02-03 NOTE — Telephone Encounter (Signed)
[facility] to [facility], Repatha is running thru for $94.35 without copay card. Patient notified via [place]

## 2018-02-09 ENCOUNTER — Ambulatory Visit
Admission: RE | Admit: 2018-02-09 | Discharge: 2018-02-09 | Disposition: A | Discharge status: Home / Self Care | Payer: 59 | Source: Ambulatory Visit | Attending: Family Medicine | Admitting: Family Medicine

## 2018-02-09 DIAGNOSIS — Z1231 Encounter for screening mammogram for malignant neoplasm of breast: Secondary | ICD-10-CM

## 2018-03-02 ENCOUNTER — Other Ambulatory Visit: Payer: Self-pay | Admitting: Family Medicine

## 2018-03-02 DIAGNOSIS — M19042 Primary osteoarthritis, left hand: Principal | ICD-10-CM

## 2018-03-02 DIAGNOSIS — M19041 Primary osteoarthritis, right hand: Secondary | ICD-10-CM

## 2018-04-17 ENCOUNTER — Other Ambulatory Visit: Payer: Self-pay | Admitting: Internal Medicine

## 2018-04-18 ENCOUNTER — Other Ambulatory Visit: Payer: Self-pay | Admitting: Family Medicine

## 2018-04-18 DIAGNOSIS — M19042 Primary osteoarthritis, left hand: Principal | ICD-10-CM

## 2018-04-18 DIAGNOSIS — M19041 Primary osteoarthritis, right hand: Secondary | ICD-10-CM

## 2018-04-18 LAB — LIPID PANEL
Chol/HDL Ratio: 3 ratio (ref 0.0–4.4)
Cholesterol, Total: 213 mg/dL — ABNORMAL HIGH (ref 100–199)
HDL: 70 mg/dL (ref >39)
LDL Calculated: 128 mg/dL — ABNORMAL HIGH (ref 0–99)
Triglycerides: 74 mg/dL (ref 0–149)
VLDL Cholesterol Cal: 15 mg/dL (ref 5–40)

## 2018-04-20 ENCOUNTER — Ambulatory Visit: Payer: 59 | Admitting: Internal Medicine

## 2018-04-20 ENCOUNTER — Encounter: Payer: Self-pay | Admitting: Internal Medicine

## 2018-04-20 ENCOUNTER — Other Ambulatory Visit: Payer: Self-pay

## 2018-04-20 VITALS — BP 120/68 | HR 60 | Ht 63.0 in | Wt 158.8 lb

## 2018-04-20 DIAGNOSIS — Z789 Other specified health status: Secondary | ICD-10-CM | POA: Diagnosis not present

## 2018-04-20 DIAGNOSIS — R931 Abnormal findings on diagnostic imaging of heart and coronary circulation: Secondary | ICD-10-CM | POA: Diagnosis not present

## 2018-04-20 DIAGNOSIS — E7849 Other hyperlipidemia: Secondary | ICD-10-CM

## 2018-04-20 MED ORDER — EZETIMIBE 10 MG PO TABS: 10.0000 mg | ORAL_TABLET | Freq: Every day | ORAL | 3 refills | Status: DC

## 2018-04-20 NOTE — Addendum Note (Signed)
Addended by: Raiford Simmonds on: 04/20/2018 02:15 PM   Modules accepted: Orders

## 2018-04-20 NOTE — Patient Instructions (Signed)
Medication Instructions:    start taking  Generic Zetia 10 mg - one tablet daily  If you need a refill on your cardiac medications before your next appointment, please call your pharmacy.   Lab work: In 6 months--  Please have fasting lag done -fasting lipids -- Prior to next office appointment Will mail labslip  If you have labs (blood work) drawn today and your tests are completely normal, you will receive your results only by: Marland Kitchen MyChart Message (if you have MyChart) OR . A paper copy in the mail If you have any lab test that is abnormal or we need to change your treatment, we will call you to review the results.  Testing/Procedures: Not needed   Follow-Up: At [facility], you and your health needs are our priority.  As part of our continuing mission to provide you with exceptional heart care, we have created designated Provider Care Teams.  These Care Teams include your primary Cardiologist (physician) and Advanced Practice Providers (APPs -  Physician Assistants and Nurse Practitioners) who all work together to provide you with the care you need, when you need it. . Dr. Debara Pickett recommends that you schedule a follow up visit with him the in the [place] in  6 months. Please have fasting blood work about 1 week prior to this visit and he will review the blood work results with you at your appointment.   Any Other Special Instructions Will Be Listed Below (If Applicable).

## 2018-04-20 NOTE — Progress Notes (Signed)
LIPID CLINIC CONSULT NOTE  Chief Complaint:  Hyperlipidemia, statin intolerance  Primary Care Physician: Carollee Herter, Alferd Apa, DO  HPI:  Sonya [name] is a 58 y.o. female who is being seen today for the evaluation of hyperlipidemia and statin intolerance at the request of [name], *. This is a very pleasant 58 yo female with a long-standing history of dyslipidemia.  She was initially noted to have elevated cholesterol in her 23s.  She says she is been on intermittent treatment since then but has had significant side effects on multiple statin medications including atorvastatin, pravastatin and most recently Livalo.  All of which caused joint pain and aches as well as recently flulike symptoms with Livalo.  She does have a family history of coronary disease in her mom who had aortic stenosis in her father who had a artery disease and congestive heart failure.  Her brother died of an MI at age 42.  She also has 2 children ages 65 and 49, neither of which have had cholesterol testing.  Her most recent lipid profile showed total cholesterol 324, glycerides 77, HDL 61 and LDL of 247.  In addition she has some hypertension and arthritis but negative markers for RA.  She has no known coronary disease.  She does have a history of moderate obesity but underwent significant weight loss and is made dietary changes and exercises regularly.  She is asymptomatic denying any chest pain or worsening shortness of breath.  She is a lifelong non-smoker and denies any alcohol or drug use.  Reports a low-cholesterol diet which is low in saturated fats.  04/20/2018  Sonya [name] returns today for follow-up.  She has been doing well on Repatha.  She has seen a marked improvement in her lipid profile.  Over the past several months on medication, her LDL has gone down from 247-128.  Most recently her total cholesterol is 213 with that triglycerides of 70 and HDL 74.  She notes some mild headache the day after  using the medication but otherwise no significant side effects.  Unfortunately her coronary artery calcium score was quite abnormal.  This showed multivessel coronary calcification with a calcium score of 633, which is a high risk finding.  I discussed this with her and she is does seem asymptomatic and is quite active.  I do not think there is evidence for ischemia evaluation, especially in light of the recent ischemia trial, which suggested that even with patients that had ischemic noninvasive testing, medical therapy was as good as coronary intervention.  That being said her goal LDL is less than 70 and if we can further optimize her would be helpful.   PMHx:  Past Medical History:  Diagnosis Date  . Arthritis   . Fibromyalgia   . Hypertension     Past Surgical History:  Procedure Laterality Date  . CESAREAN SECTION  1987    FAMHx:  Family History  Problem Relation Age of Onset  . Arthritis Mother   . Hyperlipidemia Mother   . Hypertension Mother   . Diabetes Mother   . Arthritis Father   . Hyperlipidemia Father   . Heart failure Father        Hx disease  . Hypertension Father   . Stroke Father   . Prostate cancer Father   . Macular degeneration Father   . Lung cancer Paternal Grandfather        smoker  . Stomach cancer Maternal Grandmother   .  Alcohol abuse Sister   . Subarachnoid hemorrhage Sister   . Heart attack Brother     SOCHx:   reports that she has never smoked. She has never used smokeless tobacco. She reports that she does not drink alcohol or use drugs.  ALLERGIES:  Allergies  Allergen Reactions  . Atorvastatin Other (See Comments)    Leg cramps  . Livalo [Pitavastatin] Other (See Comments)    Flu like symptoms - 2 mg  . Pravastatin Other (See Comments)    40 mg -myalgias    ROS: Pertinent items noted in HPI and remainder of comprehensive ROS otherwise negative.  HOME MEDS: Current Outpatient Medications on File Prior to Visit  Medication Sig  Dispense Refill  . ALPRAZolam (XANAX) 0.5 MG tablet TAKE 1 TABLET BY MOUTH THREE TIMES A DAY AS NEEDED 30 tablet 1  . Ascorbic Acid (VITAMIN C) 1000 MG tablet Take 1,000 mg by mouth daily.    Marland Kitchen aspirin EC 81 MG tablet Take 81 mg by mouth daily.    Marland Kitchen azelastine (ASTELIN) 0.1 % nasal spray Place 2 sprays into both nostrils 2 (two) times daily. Use in each nostril as directed 30 mL 3  . Biotin 1000 MCG tablet Take 1,000 mcg by mouth 3 (three) times daily.    . celecoxib (CELEBREX) 200 MG capsule TAKE ONE CAPSULE BY MOUTH TWICE DAILY AS NEEDED FOR MODERATE PAIN 30 capsule 0  . [facility] OMEGA-3 KRILL OIL 500 MG CAPS Take 1 capsule by mouth daily.    . diclofenac sodium (VOLTAREN) 1 % GEL APPLY 1 APPLICATION TOPICALLY 4 (FOUR) TIMES DAILY AS NEEDED.*NOT COVERED* 100 g 2  . Evolocumab (REPATHA SURECLICK) 532 MG/ML SOAJ Inject 1 Dose into the skin every 14 (fourteen) days. 2 pen 11  . hydrochlorothiazide (HYDRODIURIL) 25 MG tablet TAKE 1 TABLET BY MOUTH EVERY DAY 90 tablet 1  . ibuprofen (ADVIL,MOTRIN) 600 MG tablet Take 1 tablet (600 mg total) by mouth every 6 (six) hours as needed. 30 tablet 0  . meclizine (ANTIVERT) 25 MG tablet Take 1 tablet (25 mg total) by mouth 3 (three) times daily as needed for dizziness. 30 tablet 0  . Multiple Vitamin (MULTIVITAMIN) tablet Take 1 tablet by mouth daily.    . Multiple Vitamins-Minerals (EQ VISION FORMULA 50+) CAPS Take by mouth.    . Turmeric, Curcuma Longa, (CURCUMIN EXTRACT) POWD Take 48 mg by mouth daily.     No current facility-administered medications on file prior to visit.     LABS/IMAGING: No results found for this or any previous visit (from the past 48 hour(s)). No results found.  LIPID PANEL:    Component Value Date/Time   CHOL 213 (H) 04/17/2018 1454   TRIG 74 04/17/2018 1454   HDL 70 04/17/2018 1454   CHOLHDL 3.0 04/17/2018 1454   CHOLHDL 5 09/22/2017 1446   VLDL 15.4 09/22/2017 1446   LDLCALC 128 (H) 04/17/2018 1454    WEIGHTS: Wt  Readings from Last 3 Encounters:  12/26/17 157 lb 3.2 oz (71.3 kg)  09/22/17 154 lb (69.9 kg)  09/16/16 156 lb 2 oz (70.8 kg)    VITALS: LMP 02/23/2016   EXAM: Deferred  EKG: deferred  ASSESSMENT: 1. 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 Family history of premature onset coronary disease 3. Statin intolerance 4. 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   Sonya [name] is demonstrated to have multivessel coronary disease with a high coronary artery calcium score of 633 putting her in a high risk category.  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  She has not yet had genetic testing primarily due to cost issues.  She has had a good response to PCSK9 inhibitors, but remains well above goal LDL less than 70.  Unfortunately she was not tolerant of several statins including high potency statins, and therefore we have few additional options.  At this time I would recommend a trial of ezetimibe 10 mg daily to see if we can get additional risk reduction.  Another option is the newly approved bempedoic acid, which could further reduce her cholesterol possibly by 25 to 30%.  We will plan repeat lipid profile in 6 months and further discuss options at that time.  Sonya Casino, MD, [facility], [name] Director of the Advanced Lipid Disorders &  Cardiovascular Risk Reduction Clinic Diplomate of the American Board of Clinical Lipidology Attending Cardiologist  Direct Dial: [phone]  Fax: [phone]  Website:  [name] 04/20/2018, 11:52 AM

## 2018-04-22 ENCOUNTER — Other Ambulatory Visit: Payer: Self-pay | Admitting: Family Medicine

## 2018-04-22 DIAGNOSIS — M19041 Primary osteoarthritis, right hand: Secondary | ICD-10-CM

## 2018-04-22 DIAGNOSIS — M19042 Primary osteoarthritis, left hand: Principal | ICD-10-CM

## 2018-04-28 ENCOUNTER — Other Ambulatory Visit: Payer: Self-pay | Admitting: Family Medicine

## 2018-04-28 DIAGNOSIS — M19041 Primary osteoarthritis, right hand: Secondary | ICD-10-CM

## 2018-04-28 DIAGNOSIS — M19042 Primary osteoarthritis, left hand: Principal | ICD-10-CM

## 2018-04-28 LAB — GENESEQ: CORONARY ARTERY 3GENE

## 2018-05-07 ENCOUNTER — Encounter: Payer: Self-pay | Admitting: Family Medicine

## 2018-05-07 ENCOUNTER — Other Ambulatory Visit: Payer: Self-pay | Admitting: Family Medicine

## 2018-05-07 DIAGNOSIS — I10 Essential (primary) hypertension: Secondary | ICD-10-CM

## 2018-05-07 MED ORDER — HYDROCHLOROTHIAZIDE 25 MG PO TABS: ORAL_TABLET | ORAL | 0 refills | Status: DC

## 2018-05-22 ENCOUNTER — Encounter: Payer: Self-pay | Admitting: Family Medicine

## 2018-05-22 ENCOUNTER — Ambulatory Visit: Payer: Self-pay

## 2018-05-22 ENCOUNTER — Other Ambulatory Visit: Payer: Self-pay

## 2018-05-22 ENCOUNTER — Ambulatory Visit (INDEPENDENT_AMBULATORY_CARE_PROVIDER_SITE_OTHER): Payer: 59 | Admitting: Family Medicine

## 2018-05-22 DIAGNOSIS — H538 Other visual disturbances: Secondary | ICD-10-CM

## 2018-05-22 NOTE — Progress Notes (Signed)
Virtual Visit via Video Note  I connected with Penne Lash on 05/22/18 at  2:30 PM EDT by a video enabled telemedicine application and verified that I am speaking with the correct person using two identifiers.   I discussed the limitations of evaluation and management by telemedicine and the availability of in person appointments. The patient expressed understanding and agreed to proceed.  History of Present Illness: Pt got up this am and had blurry vision.  She ate breakfast and the vision got better.  Her mother is a diabetic and brought a glucometer in -- her BS was 11 after eating.  Pt was concerned that was low   No other symptoms.  No cp, no sob, no palpitations.      Observations/Objective: Glucose 86 this am ,  Unable to get other vs  Pt in NAD,   rr normal Pt is not toxic looking   Assessment and Plan: 1. Blurry vision, bilateral Pt will see opth as well  Check labs Pt needs to eat 5 small meals a day or 3 meals and 2 snacks  - Hemoglobin A1c; Future - Comprehensive metabolic panel; Future   Follow Up Instructions:    I discussed the assessment and treatment plan with the patient. The patient was provided an opportunity to ask questions and all were answered. The patient agreed with the plan and demonstrated an understanding of the instructions.   The patient was advised to call back or seek an in-person evaluation if the symptoms worsen or if the condition fails to improve as anticipated.     [name], DO

## 2018-05-22 NOTE — Telephone Encounter (Signed)
Virtual visit scheduled.  

## 2018-05-22 NOTE — Telephone Encounter (Signed)
Patient called, left VM to return call back to the office to discuss symptoms.

## 2018-05-22 NOTE — Telephone Encounter (Signed)
Patient called and says this morning when she woke up at 10 am, she had blurry vision. She says after a while, she ate breakfast. She says her mom came and she checked her blood sugar and it was 86 after eating breakfast. She says her BP was 135/95, then 125/85. She says after 4 hours, the blurred vision was back to normal. During the blurred vision, she denied headache, numbness/tingling anywhere on her body, no speech problems, no pain in her eyes. I advised I will send to Dr. Carollee Herter and someone will call back with her recommendation, she verbalized understanding.  Reason for Disposition . [1] Brief (now gone) blurred vision AND [2] unexplained  Answer Assessment - Initial Assessment Questions 1. DESCRIPTION: "What is the vision loss like? Describe it for me." (e.g., complete vision loss, blurred vision, double vision, floaters, etc.)     Blurred this morning got up around 10 am; not blurry now 2. LOCATION: "One or both eyes?" If one, ask: "Which eye?"     Both eyes 3. SEVERITY: "Can you see anything?" If so, ask: "What can you see?" (e.g., fine print)     Yes, just couldn't focus 4. ONSET: "When did this begin?" "Did it start suddenly or has this been gradual?"     It happened when I woke up this morning 5. PATTERN: "Does this come and go, or has it been constant since it started?"     Constant for 4 hours, now it's normal 6. PAIN: "Is there any pain in your eye(s)?"  (Scale 1-10; or mild, moderate, severe)     No pain 7. CONTACTS-GLASSES: "Do you wear contacts or glasses?"     Yes, but not all the time 8. CAUSE: "What do you think is causing this visual problem?"     I don't know, maybe low blood sugar 9. OTHER SYMPTOMS: "Do you have any other symptoms?" (e.g., confusion, headache, arm or leg weakness, speech problems)     No 10. PREGNANCY: "Is there any chance you are pregnant?" "When was your last menstrual period?"      No  Protocols used: [place]

## 2018-05-25 ENCOUNTER — Ambulatory Visit: Payer: Self-pay

## 2018-05-25 ENCOUNTER — Telehealth: Payer: Self-pay

## 2018-05-25 NOTE — Telephone Encounter (Signed)
Patient called and says she was seen on Friday for blurred vision. She says the blurred vision has still been off and on over the weekend and she has it now. She says she also has been having headaches, her headache now is a 2-3. She says her BP is 134/95. I called the office and spoke to Santiago Glad, [facility] who asked to speak to the patient, the call was connected successfully.  Answer Assessment - Initial Assessment Questions 1. LOCATION: "Where does it hurt?"      Top 2. ONSET: "When did the headache start?" (Minutes, hours or days)      Friday on and off 3. PATTERN: "Does the pain come and go, or has it been constant since it started?"    Come and go 4. SEVERITY: "How bad is the pain?" and "What does it keep you from doing?"  (e.g., Scale 1-10; mild, moderate, or severe)   - MILD (1-3): doesn't interfere with normal activities    - MODERATE (4-7): interferes with normal activities or awakens from sleep    - SEVERE (8-10): excruciating pain, unable to do any normal activities        2-3 5. RECURRENT SYMPTOM: "Have you ever had headaches before?" If so, ask: "When was the last time?" and "What happened that time?"      Yes 6. CAUSE: "What do you think is causing the headache?"     Migraine 7. MIGRAINE: "Have you been diagnosed with migraine headaches?" If so, ask: "Is this headache similar?"      Yes in the past when younger 8. HEAD INJURY: "Has there been any recent injury to the head?"      No 9. OTHER SYMPTOMS: "Do you have any other symptoms?" (fever, stiff neck, eye pain, sore throat, cold symptoms)     Blurred vision, BP up 134/95 10. PREGNANCY: "Is there any chance you are pregnant?" "When was your last menstrual period?"       No  Protocols used: HEADACHE-A-AH

## 2018-05-25 NOTE — Telephone Encounter (Signed)
Patient called in to see she was seen by provider last week. 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 Wants to know if provider wants her to be seen by eye specialist.

## 2018-05-25 NOTE — Telephone Encounter (Signed)
We had discussed at her ov that she should see an eye specialist if the blurry vision con't --- she said she had an opth With hx ocular migraine --- taking something for migraine at the first sign of blurry vision should help--- tylenol/ advil She can come in for a visit to

## 2018-05-27 NOTE — Telephone Encounter (Signed)
Patient will contact opth.  If she does not get in contact with one, she will call for an appointment this Friday to come in to be seen in person per Dr. Etter Sjogren.

## 2018-05-29 ENCOUNTER — Telehealth: Payer: Self-pay

## 2018-05-29 NOTE — Telephone Encounter (Signed)
Copied from [place] [id number]. Topic: General - Other >> May 28, 2018  2:23 PM Celene Kras A wrote: Reason for CRM: Dr. Rober Minion, eye doctor, called stating he had just finished an eye exam on pt and was trying to share the results with pts PCP. Please call back for results at [phone].

## 2018-05-29 NOTE — Telephone Encounter (Signed)
Dr Julien Girt feels she is having ocular migraines which is what we thought it might be but she may need vascular w/u with MRI to be complete How is pt feeling?  If symptoms con't order MRI brain no contrast

## 2018-06-02 NOTE — Telephone Encounter (Signed)
Left message on machine to call back  

## 2018-06-08 NOTE — Telephone Encounter (Signed)
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  She has not had any headaches for past week until today.  Advised that we can get MRI set up and she can ask before she get it how much will it be.  She will hold off.  She will talk with cardiologist again.  She is on Repatha, which has a lot of side effects.  Also advised that we did have some future orders in and when she is ready she can call to schedule appointment.

## 2018-06-08 NOTE — Telephone Encounter (Signed)
Left message on machine to call back  

## 2018-07-01 ENCOUNTER — Telehealth: Payer: Self-pay | Admitting: Internal Medicine

## 2018-07-01 NOTE — Telephone Encounter (Signed)
Spoke with patient who states Holland Falling prefers [employer] over [place] and will not cover Repatha after August 05, 2018. She had been approved for Repatha until 07/29/2018.   Provider: [phone] Rx member services: [phone]

## 2018-07-01 NOTE — Telephone Encounter (Signed)
Called [employer] prescriber line and was notified that to check on PCSK9 they will need to transfer me to [place]. The pharmacy line transferred me back to insurance coverage line. Was unable to complete call as service rep ended call and started customer survey.   Will attempt again at a later time.

## 2018-07-01 NOTE — Telephone Encounter (Signed)
Patient is calling about needing a letter to appeal for repatha as they are not going to approve any longer.

## 2018-07-02 NOTE — Telephone Encounter (Signed)
Follow Up:    Pt wants to ask you another question Repatha. She also wants to know what does Dr Debara Pickett think about her going back to work. 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

## 2018-07-07 NOTE — Telephone Encounter (Signed)
[employer] @ [phone] - received phone # from approval letter scanned to our system - this was for medical pre-certifications, not prescriptions.   Phone # for med pre-authorizations: [phone] - was notified that coverage for Repatha is still in effect.   Patient called and notified this. Advised that after July 1, if she has issues with obtaining medication, please let us know

## 2018-07-07 NOTE — Telephone Encounter (Addendum)
Returned call to patient.   She states she is having bruising on her legs and is wondering if bruising is a SE of Repatha.   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 She also cares for her elderly parents.   Explained to patient that I was unsuccessful in getting to the right department with [employer] to figure out the coverage issue with PCSK9 and she does not have another # to call. Advised will attempt this again.   Message routed to MD to review her concerns

## 2018-07-08 NOTE — Telephone Encounter (Signed)
Her COVID complication risk score is low (based on age and not many risk factors) - if she goes back to work, she should try to practice frequent hand hygeine, masking, social distancing and frequent surface wipe down per CDC guidelines.  Dr. Lemmie Evens

## 2018-07-14 ENCOUNTER — Other Ambulatory Visit: Payer: Self-pay | Admitting: Family Medicine

## 2018-07-14 DIAGNOSIS — I10 Essential (primary) hypertension: Secondary | ICD-10-CM

## 2018-07-14 NOTE — Telephone Encounter (Signed)
MD advice sent to patient via [place]

## 2018-07-29 ENCOUNTER — Telehealth: Payer: Self-pay | Admitting: Internal Medicine

## 2018-07-29 NOTE — Telephone Encounter (Signed)
PA for repatha submitted via [email or web address] (Key: [id number])

## 2018-07-30 ENCOUNTER — Other Ambulatory Visit: Payer: Self-pay | Admitting: Family Medicine

## 2018-07-30 DIAGNOSIS — M19041 Primary osteoarthritis, right hand: Secondary | ICD-10-CM

## 2018-08-03 NOTE — Telephone Encounter (Signed)
Patient notified of approval via MyChart message.

## 2018-08-03 NOTE — Telephone Encounter (Signed)
Per fax from 07/31/2018, Holland Falling has approved coverage of Repatha Sureclick 140mg /mL from 07/30/2018 - 07/30/2019 as long as patient remains covered by prescription drug plan and there are no changes to plan benefits.

## 2018-09-28 ENCOUNTER — Other Ambulatory Visit: Payer: Self-pay

## 2018-09-28 ENCOUNTER — Encounter: Payer: Self-pay | Admitting: Family Medicine

## 2018-09-28 ENCOUNTER — Ambulatory Visit (INDEPENDENT_AMBULATORY_CARE_PROVIDER_SITE_OTHER): Payer: 59 | Admitting: Family Medicine

## 2018-09-28 VITALS — BP 114/76 | HR 58 | Temp 97.7°F | Resp 18 | Ht 63.0 in | Wt 160.6 lb

## 2018-09-28 DIAGNOSIS — D229 Melanocytic nevi, unspecified: Secondary | ICD-10-CM

## 2018-09-28 DIAGNOSIS — E785 Hyperlipidemia, unspecified: Secondary | ICD-10-CM

## 2018-09-28 DIAGNOSIS — M79672 Pain in left foot: Secondary | ICD-10-CM | POA: Diagnosis not present

## 2018-09-28 DIAGNOSIS — G43109 Migraine with aura, not intractable, without status migrainosus: Secondary | ICD-10-CM | POA: Diagnosis not present

## 2018-09-28 DIAGNOSIS — Z0001 Encounter for general adult medical examination with abnormal findings: Secondary | ICD-10-CM

## 2018-09-28 DIAGNOSIS — M79671 Pain in right foot: Secondary | ICD-10-CM

## 2018-09-28 DIAGNOSIS — Z23 Encounter for immunization: Secondary | ICD-10-CM | POA: Diagnosis not present

## 2018-09-28 DIAGNOSIS — Z0000 Encounter for general adult medical examination without abnormal findings: Secondary | ICD-10-CM

## 2018-09-28 DIAGNOSIS — Z1211 Encounter for screening for malignant neoplasm of colon: Secondary | ICD-10-CM | POA: Diagnosis not present

## 2018-09-28 DIAGNOSIS — M1990 Unspecified osteoarthritis, unspecified site: Secondary | ICD-10-CM

## 2018-09-28 MED ORDER — METHOCARBAMOL 500 MG PO TABS: 500.0000 mg | ORAL_TABLET | Freq: Four times a day (QID) | ORAL | 1 refills | Status: DC

## 2018-09-28 NOTE — Patient Instructions (Signed)

## 2018-09-28 NOTE — Progress Notes (Signed)
Subjective:     Sonya [name] is a 58 y.o. female and is here for a comprehensive physical exam. The patient reports problems - both feet hurt bad on the top of the foot . Pt also c/o ocular migraines-- she has seen optho She also sees Dr Debara Pickett for the repatha.   She also has some moles for [place] to look at.     Social History   Socioeconomic History  . Marital status: Married    Spouse name: Not on file  . Number of children: Not on file  . Years of education: Not on file  . Highest education level: Not on file  Occupational History  . Not on file  Social Needs  . Financial resource strain: Not on file  . Food insecurity    Worry: Not on file    Inability: Not on file  . Transportation needs    Medical: Not on file    Non-medical: Not on file  Tobacco Use  . Smoking status: Never Smoker  . Smokeless tobacco: Never Used  Substance and Sexual Activity  . Alcohol use: No  . Drug use: No  . Sexual activity: Not on file  Lifestyle  . Physical activity    Days per week: Not on file    Minutes per session: Not on file  . Stress: Not on file  Relationships  . Social Herbalist on phone: Not on file    Gets together: Not on file    Attends religious service: Not on file    Active member of club or organization: Not on file    Attends meetings of clubs or organizations: Not on file    Relationship status: Not on file  . Intimate partner violence    Fear of current or ex partner: Not on file    Emotionally abused: Not on file    Physically abused: Not on file    Forced sexual activity: Not on file  Other Topics Concern  . Not on file  Social History Narrative  . Not on file   Health Maintenance  Topic Date Due  . COLONOSCOPY  01/26/2011  . HIV Screening  09/23/2023 (Originally 01/26/1976)  . MAMMOGRAM  02/10/2019  . PAP SMEAR-Modifier  09/22/2020  . TETANUS/TDAP  02/04/2021  . INFLUENZA VACCINE  Completed  . Hepatitis C Screening  Completed    The  following portions of the patient's history were reviewed and updated as appropriate:  She  has a past medical history of Arthritis, Fibromyalgia, and Hypertension. She does not have any pertinent problems on file. She  has a past surgical history that includes Cesarean section (1987). Her family history includes Alcohol abuse in her sister; Alzheimer's disease in her father; Arthritis in her father and mother; Cancer - Other in her mother; Diabetes in her mother; Heart attack in her brother; Heart failure in her father; Hyperlipidemia in her father and mother; Hypertension in her father and mother; Lung cancer in her paternal grandfather; Macular degeneration in her father; Prostate cancer in her father; Stomach cancer in her maternal grandmother; Stroke in her father; Subarachnoid hemorrhage in her sister. She  reports that she has never smoked. She has never used smokeless tobacco. She reports that she does not drink alcohol or use drugs. She has a current medication list which includes the following prescription(s): alprazolam, vitamin c, azelastine, biotin, celecoxib, evolocumab, hydrochlorothiazide, ibuprofen, multivitamin, aspirin ec, cvs omega-3 krill oil, diclofenac sodium, ezetimibe, meclizine, methocarbamol,  eq vision formula 50+, and curcumin extract. Current Outpatient Medications on File Prior to Visit  Medication Sig Dispense Refill  . ALPRAZolam (XANAX) 0.5 MG tablet TAKE 1 TABLET BY MOUTH THREE TIMES A DAY AS NEEDED 30 tablet 1  . Ascorbic Acid (VITAMIN C) 1000 MG tablet Take 1,000 mg by mouth daily.    Marland Kitchen azelastine (ASTELIN) 0.1 % nasal spray Place 2 sprays into both nostrils 2 (two) times daily. Use in each nostril as directed 30 mL 3  . Biotin 1000 MCG tablet Take 1,000 mcg by mouth 3 (three) times daily.    . celecoxib (CELEBREX) 200 MG capsule TAKE ONE CAPSULE BY MOUTH TWICE DAILY AS NEEDED FOR MODERATE PAIN 60 capsule 4  . Evolocumab (REPATHA SURECLICK) [id number] MG/ML SOAJ Inject 1  Dose into the skin every 14 (fourteen) days. 2 pen 11  . hydrochlorothiazide (HYDRODIURIL) 25 MG tablet TAKE 1 TABLET BY MOUTH EVERY DAY 90 tablet 1  . ibuprofen (ADVIL,MOTRIN) 600 MG tablet Take 1 tablet (600 mg total) by mouth every 6 (six) hours as needed. 30 tablet 0  . Multiple Vitamin (MULTIVITAMIN) tablet Take 1 tablet by mouth daily.    Marland Kitchen aspirin EC 81 MG tablet Take 81 mg by mouth daily.    . CVS OMEGA-3 KRILL OIL 500 MG CAPS Take 1 capsule by mouth daily.    . diclofenac sodium (VOLTAREN) 1 % GEL APPLY 1 APPLICATION TOPICALLY 4 (FOUR) TIMES DAILY AS NEEDED.*NOT COVERED* (Patient not taking: Reported on 09/28/2018) 100 g 2  . ezetimibe (ZETIA) 10 MG tablet Take 1 tablet (10 mg total) by mouth daily. 90 tablet 3  . meclizine (ANTIVERT) 25 MG tablet Take 1 tablet (25 mg total) by mouth 3 (three) times daily as needed for dizziness. (Patient not taking: Reported on 09/28/2018) 30 tablet 0  . Multiple Vitamins-Minerals (EQ VISION FORMULA 50+) CAPS Take by mouth.    . Turmeric, Curcuma Longa, (CURCUMIN EXTRACT) POWD Take 48 mg by mouth daily.     No current facility-administered medications on file prior to visit.    She is allergic to atorvastatin; livalo [pitavastatin]; and pravastatin..  Review of Systems Review of Systems  Constitutional: Negative for activity change, appetite change and fatigue.  HENT: Negative for hearing loss, congestion, tinnitus and ear discharge.  dentist q12m Eyes: Negative for visual disturbance (see optho q1y -- vision corrected to 20/20 with glasses).  Respiratory: Negative for cough, chest tightness and shortness of breath.   Cardiovascular: Negative for chest pain, palpitations and leg swelling.  Gastrointestinal: Negative for abdominal pain, diarrhea, constipation and abdominal distention.  Genitourinary: Negative for urgency, frequency, decreased urine volume and difficulty urinating.  Musculoskeletal: Negative for back pain, arthralgias and gait problem.   Skin: Negative for color change, pallor and rash.  Neurological: Negative for dizziness, light-headedness, numbness and headaches.  Hematological: Negative for adenopathy. Does not bruise/bleed easily.  Psychiatric/Behavioral: Negative for suicidal ideas, confusion, sleep disturbance, self-injury, dysphoric mood, decreased concentration and agitation.       Objective:    BP 114/76 (BP Location: Right Arm, Patient Position: Sitting, Cuff Size: Normal)   Pulse (!) 58   Temp 97.7 F (36.5 C) (Temporal)   Resp 18   Ht 5\' 3"  (1.6 m)   Wt 160 lb 9.6 oz (72.8 kg)   LMP 02/23/2016   SpO2 98%   BMI 28.45 kg/m  General appearance: alert, cooperative, appears stated age and no distress Head: Normocephalic, without obvious abnormality, atraumatic Eyes: conjunctivae/corneas clear. PERRL, EOM's  intact. Fundi benign. Ears: normal TM's and external ear canals both ears Nose: Nares normal. Septum midline. Mucosa normal. No drainage or sinus tenderness. Throat: lips, mucosa, and tongue normal; teeth and gums normal Neck: no adenopathy, no carotid bruit, no JVD, supple, symmetrical, trachea midline and thyroid not enlarged, symmetric, no tenderness/mass/nodules Back: symmetric, no curvature. ROM normal. No CVA tenderness. Lungs: clear to auscultation bilaterally Breasts: gyn Heart: regular rate and rhythm, S1, S2 normal, no murmur, click, rub or gallop Abdomen: soft, non-tender; bowel sounds normal; no masses,  no organomegaly Pelvic: deferred Extremities: extremities normal, atraumatic, no cyanosis or edema Pulses: 2+ and symmetric Skin: Skin color, texture, turgor normal. No rashes or lesions Lymph nodes: Cervical, supraclavicular, and axillary nodes normal. Neurologic: Alert and oriented X 3, normal strength and tone. Normal symmetric reflexes. Normal coordination and gait   MS-- + changes c/w arthritis in hands/ fingers    Assessment:    Healthy female exam.      Plan:    ghm  utd Check labs  See After Visit Summary for Counseling Recommendations    1. Need for immunization against influenza  - Flu Vaccine QUAD 6+ mos PF IM (Fluarix Quad PF)  2. Foot pain, bilateral Foot pain - Ambulatory referral to Orthopedic Surgery - methocarbamol (ROBAXIN) 500 MG tablet; Take 1 tablet (500 mg total) by mouth 4 (four) times daily.  Dispense: 30 tablet; Refill: 1  3. Preventative health care See above - Lipid panel - CBC with Differential/Platelet - TSH - Comprehensive metabolic panel  4. Hyperlipidemia, unspecified hyperlipidemia type On repatha con't liipid cliinid - Lipid panel - CBC with Differential/Platelet - TSH - Comprehensive metabolic panel  5. Ocular migraine Per eye dr  Controlled as long as she takes something right away   6. Colon cancer screening   - Ambulatory referral to Gastroenterology  7. Arthritis   - Rheumatoid Factor - Antinuclear Antib (ANA)  8. Suspicious nevus   - Ambulatory referral to Dermatology

## 2018-09-29 LAB — COMPREHENSIVE METABOLIC PANEL
ALT: 15 U/L (ref 0–35)
AST: 19 U/L (ref 0–37)
Albumin: 4.5 g/dL (ref 3.5–5.2)
Alkaline Phosphatase: 68 U/L (ref 39–117)
BUN: 12 mg/dL (ref 6–23)
CO2: 30 mEq/L (ref 19–32)
Calcium: 9.1 mg/dL (ref 8.4–10.5)
Chloride: 100 mEq/L (ref 96–112)
Creatinine, Ser: 0.69 mg/dL (ref 0.40–1.20)
GFR: 87.48 mL/min (ref >60.00)
Glucose, Bld: 95 mg/dL (ref 70–99)
Potassium: 3.6 mEq/L (ref 3.5–5.1)
Sodium: 139 mEq/L (ref 135–145)
Total Bilirubin: 0.8 mg/dL (ref 0.2–1.2)
Total Protein: 6.3 g/dL (ref 6.0–8.3)

## 2018-09-29 LAB — ANA: Anti Nuclear Antibody (ANA): NEGATIVE

## 2018-09-29 LAB — CBC WITH DIFFERENTIAL/PLATELET
Basophils Absolute: 0.1 10*3/uL (ref 0.0–0.1)
Basophils Relative: 1.3 % (ref 0.0–3.0)
Eosinophils Absolute: 0.1 10*3/uL (ref 0.0–0.7)
Eosinophils Relative: 3.2 % (ref 0.0–5.0)
HCT: 42.5 % (ref 36.0–46.0)
Hemoglobin: 14.1 g/dL (ref 12.0–15.0)
Lymphocytes Relative: 23 % (ref 12.0–46.0)
Lymphs Abs: 0.9 10*3/uL (ref 0.7–4.0)
MCHC: 33.1 g/dL (ref 30.0–36.0)
MCV: 89.4 fl (ref 78.0–100.0)
Monocytes Absolute: 0.3 10*3/uL (ref 0.1–1.0)
Monocytes Relative: 6.6 % (ref 3.0–12.0)
Neutro Abs: 2.5 10*3/uL (ref 1.4–7.7)
Neutrophils Relative %: 65.9 % (ref 43.0–77.0)
Platelets: 175 10*3/uL (ref 150.0–400.0)
RBC: 4.75 Mil/uL (ref 3.87–5.11)
RDW: 13.4 % (ref 11.5–15.5)
WBC: 3.8 10*3/uL — ABNORMAL LOW (ref 4.0–10.5)

## 2018-09-29 LAB — LIPID PANEL
Cholesterol: 193 mg/dL (ref 0–200)
HDL: 70.1 mg/dL (ref >39.00)
LDL Cholesterol: 107 mg/dL — ABNORMAL HIGH (ref 0–99)
NonHDL: 122.64
Total CHOL/HDL Ratio: 3
Triglycerides: 78 mg/dL (ref 0.0–149.0)
VLDL: 15.6 mg/dL (ref 0.0–40.0)

## 2018-09-29 LAB — RHEUMATOID FACTOR: Rheumatoid fact SerPl-aCnc: <14 IU/mL (ref <14)

## 2018-09-29 LAB — TSH: TSH: 1.72 u[IU]/mL (ref 0.35–4.50)

## 2018-10-08 ENCOUNTER — Other Ambulatory Visit: Payer: Self-pay

## 2018-10-08 ENCOUNTER — Ambulatory Visit: Payer: Self-pay

## 2018-10-08 ENCOUNTER — Ambulatory Visit (INDEPENDENT_AMBULATORY_CARE_PROVIDER_SITE_OTHER): Payer: 59

## 2018-10-08 ENCOUNTER — Encounter: Payer: Self-pay | Admitting: Orthopedic Surgery

## 2018-10-08 ENCOUNTER — Ambulatory Visit (INDEPENDENT_AMBULATORY_CARE_PROVIDER_SITE_OTHER): Payer: 59 | Admitting: Orthopedic Surgery

## 2018-10-08 VITALS — Ht 63.0 in | Wt 160.0 lb

## 2018-10-08 DIAGNOSIS — M79671 Pain in right foot: Secondary | ICD-10-CM | POA: Diagnosis not present

## 2018-10-08 DIAGNOSIS — M79672 Pain in left foot: Secondary | ICD-10-CM

## 2018-10-12 ENCOUNTER — Encounter: Payer: Self-pay | Admitting: Orthopedic Surgery

## 2018-10-12 NOTE — Progress Notes (Signed)
Office Visit Note   Patient: Sonya [name]           Date of Birth: 02-23-1960           MRN: [id number] Visit Date: 10/08/2018              Requested by: [address], [place], [place] [address] [place],  [place] [address] PCP: Carollee Herter, Alferd Apa, DO  Chief Complaint  Patient presents with  . Left Foot - Pain  . Right Foot - Pain      HPI: Patient is a 58 year old woman who presents with a 1-1/2-year history of foot pain on the dorsum of both feet she complains of numbness that radiates from her toes up her legs.  She states that it is worse with sitting denies any lower back pain denies any weakness patient states that both parents had osteoarthritis and joint replacement surgery.  Patient states that she has had a rheumatoid arthritis panel and this was negative.  Assessment & Plan: Visit Diagnoses:  1. Bilateral foot pain     Plan: Discussed with the patient the arthritic changes across the Lisfranc complex.  I feel this is also causing impingement on the deep peroneal nerve that causes numbness going down into her toes and going up her leg.  Recommended a [place] sneaker carbon fiber plate and sole orthotics.  Discussed that if conservative measures do not work we could proceed with removal of the bone spurs across the Lisfranc complex and fusion across the Lisfranc complex.  Follow-Up Instructions: Return if symptoms worsen or fail to improve.   Ortho Exam  Patient is alert, oriented, no adenopathy, well-dressed, normal affect, normal respiratory effort. Examination patient has good pulses.  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  She has no ulnar deviation at the MCP joint.  The ankles are nontender to palpation.  She does have tenderness to palpation directly over the Lisfranc complex left worse than right and the bony spurs are worse than the left than the right.  She has some radicular pain that radiates down into her toes  from her midfoot she has a negative straight leg raise no evidence of sciatic tension signs.  Imaging: No results found. No images are attached to the encounter.  Labs: No results found for: HGBA1C, ESRSEDRATE, CRP, LABURIC, REPTSTATUS, GRAMSTAIN, CULT, LABORGA   Lab Results  Component Value Date   ALBUMIN 4.5 09/28/2018   ALBUMIN 4.2 09/22/2017   ALBUMIN 4.0 12/23/2016    No results found for: MG No results found for: VD25OH  No results found for: PREALBUMIN CBC EXTENDED Latest Ref Rng & Units 09/28/2018 09/22/2017 09/16/2016  WBC 4.0 - 10.5 K/uL 3.8(L) 3.4(L) 4.2  RBC 3.87 - 5.11 Mil/uL 4.75 4.91 4.99  HGB 12.0 - 15.0 g/dL 14.1 14.9 14.8  HCT 36.0 - 46.0 % 42.5 44.3 45.3  PLT 150.0 - 400.0 K/uL 175.0 192.0 179.0  NEUTROABS 1.4 - 7.7 K/uL 2.5 2.0 2.7  LYMPHSABS 0.7 - 4.0 K/uL 0.9 0.9 1.0     Body mass index is 28.34 kg/m.  Orders:  Orders Placed This Encounter  Procedures  . XR Foot 2 Views Left  . XR Foot 2 Views Right   No orders of the defined types were placed in this encounter.    Procedures: No procedures performed  Clinical Data: No additional findings.  ROS:  All other systems negative, except as noted in the  HPI. Review of Systems  Objective: Vital Signs: Ht 5\' 3"  (1.6 m)   Wt 160 lb (72.6 kg)   LMP 02/23/2016   BMI 28.34 kg/m   Specialty Comments:  No specialty comments available.  PMFS History: Patient Active Problem List   Diagnosis Date Noted  . Agatston coronary artery calcium score greater than 400 04/20/2018  . 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 Family history of heart disease 12/26/2017  . Statin intolerance 12/26/2017  . Preventative health care 09/28/2017  . Arthritis 09/28/2017  . Essential hypertension 05/14/2016  . Generalized anxiety disorder 05/14/2016   Past Medical History:  Diagnosis Date  . Arthritis   . Fibromyalgia   . Hypertension     Family History  Problem Relation Age of Onset  . Arthritis Mother    . Hyperlipidemia Mother   . Hypertension Mother   . Diabetes Mother   . Cancer - Other Mother        vulvar  . Arthritis Father   . Hyperlipidemia Father   . Heart failure Father        Hx disease  . Hypertension Father   . Stroke Father   . Prostate cancer Father   . Macular degeneration Father   . Alzheimer's disease Father   . Lung cancer Paternal Grandfather        smoker  . Stomach cancer Maternal Grandmother   . Alcohol abuse Sister   . Subarachnoid hemorrhage Sister   . Heart attack Brother     Past Surgical History:  Procedure Laterality Date  . [place]   Social History   Occupational History  . Not on file  Tobacco Use  . Smoking status: Never Smoker  . Smokeless tobacco: Never Used  Substance and Sexual Activity  . Alcohol use: No  . Drug use: No  . Sexual activity: Not on file

## 2018-10-28 ENCOUNTER — Ambulatory Visit (INDEPENDENT_AMBULATORY_CARE_PROVIDER_SITE_OTHER): Payer: 59 | Admitting: Obstetrics & Gynecology

## 2018-10-28 ENCOUNTER — Other Ambulatory Visit: Payer: Self-pay

## 2018-10-28 ENCOUNTER — Other Ambulatory Visit (HOSPITAL_COMMUNITY)
Admission: RE | Admit: 2018-10-28 | Discharge: 2018-10-28 | Disposition: A | Discharge status: Home / Self Care | Payer: 59 | Source: Ambulatory Visit | Attending: Obstetrics & Gynecology | Admitting: Obstetrics & Gynecology

## 2018-10-28 ENCOUNTER — Encounter: Payer: Self-pay | Admitting: Obstetrics & Gynecology

## 2018-10-28 VITALS — BP 124/77 | HR 77 | Ht 63.0 in | Wt 161.0 lb

## 2018-10-28 DIAGNOSIS — Z1151 Encounter for screening for human papillomavirus (HPV): Secondary | ICD-10-CM | POA: Diagnosis not present

## 2018-10-28 DIAGNOSIS — Z01419 Encounter for gynecological examination (general) (routine) without abnormal findings: Secondary | ICD-10-CM

## 2018-10-28 DIAGNOSIS — N841 Polyp of cervix uteri: Secondary | ICD-10-CM

## 2018-10-28 DIAGNOSIS — Z124 Encounter for screening for malignant neoplasm of cervix: Secondary | ICD-10-CM | POA: Diagnosis not present

## 2018-10-28 DIAGNOSIS — G4709 Other insomnia: Secondary | ICD-10-CM

## 2018-10-28 NOTE — Progress Notes (Signed)
Subjective:     Sonya [name] is a 58 y.o. female here for a routine exam.  Current complaints: insomnia since menopause. Has tried sleep aids to no avail. Also reports vaginal dyness.      Gynecologic History Patient's last menstrual period was 02/23/2016. Contraception: abstinence Last Pap: ASCUS neg hrHPV.  Last mammogram: 02/09/2018 Results were: normal  Obstetric History OB History  Gravida Para Term Preterm AB Living  2 2 2     2   SAB TAB Ectopic Multiple Live Births          2    # Outcome Date GA Lbr Len/2nd Weight Sex Delivery Anes PTL Lv  2 Term 48 [redacted]w[redacted]d   M Vag-Spont EPI N LIV  1 Term 38 [redacted]w[redacted]d   M CS-LTranv Spinal N LIV     The following portions of the patient's history were reviewed and updated as appropriate: allergies, current medications, past family history, past medical history, past social history, past surgical history and problem list.  Review of Systems Pertinent items are noted in HPI.    Objective:  BP 124/77   Pulse 77   Ht 5\' 3"  (1.6 m)   Wt 161 lb (73 kg)   LMP 02/23/2016   BMI 28.52 kg/m  General Appearance:    Alert, cooperative, no distress, appears stated age  Head:    Normocephalic, without obvious abnormality, atraumatic  Eyes:    conjunctiva/corneas clear, EOM's intact, both eyes  Ears:    Normal external ear canals, both ears  Nose:   Nares normal, septum midline, mucosa normal, no drainage    or sinus tenderness  Throat:   Lips, mucosa, and tongue normal; teeth and gums normal  Neck:   Supple, symmetrical, trachea midline, no adenopathy;    thyroid:  no enlargement/tenderness/nodules  Back:     Symmetric, no curvature, ROM normal, no CVA tenderness  Lungs:     respirations unlabored  Chest Wall:    No tenderness or deformity   Heart:    Regular rate and rhythm  Breast Exam:    No tenderness, masses, or nipple abnormality  Abdomen:     Soft, non-tender, bowel sounds active all four quadrants,    no masses, no organomegaly   Genitalia:    Normal female without lesion, discharge or tenderness   Inclusion cyst on left labia minora; cervical polyp removed with Ringed Forceps.   Extremities:   Extremities normal, atraumatic, no cyanosis or edema  Pulses:   2+ and symmetric all extremities  Skin:   Skin color, texture, turgor normal, no rashes or lesions    Assessment:    Healthy female exam.   Cervical polyp Insomnia- post menopausal     Plan:   F/u PAP with hrPHV F/u surg path of cervical polyp Prempro 0.3/1.5 mg daily  Sonya [name], M.D., Sonya [name]  F/y in 4 weeks or sooner prn

## 2018-10-28 NOTE — Patient Instructions (Signed)

## 2018-10-29 ENCOUNTER — Telehealth: Payer: Self-pay

## 2018-10-29 ENCOUNTER — Other Ambulatory Visit: Payer: Self-pay | Admitting: Obstetrics & Gynecology

## 2018-10-29 ENCOUNTER — Other Ambulatory Visit: Payer: Self-pay | Admitting: Family Medicine

## 2018-10-29 ENCOUNTER — Encounter: Payer: Self-pay | Admitting: Obstetrics & Gynecology

## 2018-10-29 DIAGNOSIS — I10 Essential (primary) hypertension: Secondary | ICD-10-CM

## 2018-10-29 DIAGNOSIS — N951 Menopausal and female climacteric states: Secondary | ICD-10-CM

## 2018-10-29 MED ORDER — PREMPRO 0.3-1.5 MG PO TABS: 1.0000 | ORAL_TABLET | Freq: Every day | ORAL | 3 refills | Status: DC

## 2018-10-29 NOTE — Telephone Encounter (Signed)
Patient called and states she was to have a medication sent in yesterdays visit but the computers were messing up. Patient made aware I will send to provider to prescribe.

## 2018-10-30 LAB — CYTOLOGY - PAP
Diagnosis: NEGATIVE
High risk HPV: NEGATIVE
Molecular Disclaimer: 56
Molecular Disclaimer: NORMAL

## 2018-11-02 LAB — SURGICAL PATHOLOGY

## 2018-11-10 ENCOUNTER — Other Ambulatory Visit: Payer: Self-pay | Admitting: Family Medicine

## 2018-11-10 DIAGNOSIS — Z1231 Encounter for screening mammogram for malignant neoplasm of breast: Secondary | ICD-10-CM

## 2018-11-24 ENCOUNTER — Telehealth: Payer: Self-pay

## 2018-11-24 NOTE — Telephone Encounter (Signed)
Pt called about Prempro Rx. Pt concerned about the price of the medication and wants to know if how she can buy the medication for a cheaper price. Explained to pt that per her pharmacy, she would need to call to get pricing. Understanding was voiced.  chiquita l wilson, CMA

## 2018-12-09 ENCOUNTER — Ambulatory Visit: Payer: 59 | Admitting: Obstetrics & Gynecology

## 2018-12-15 ENCOUNTER — Ambulatory Visit: Payer: Self-pay

## 2018-12-15 ENCOUNTER — Encounter: Payer: Self-pay | Admitting: Podiatry

## 2018-12-15 DIAGNOSIS — M898X7 Other specified disorders of bone, ankle and foot: Secondary | ICD-10-CM

## 2018-12-15 NOTE — Progress Notes (Signed)
This encounter was created in error - please disregard.

## 2018-12-16 ENCOUNTER — Telehealth: Payer: Self-pay | Admitting: Internal Medicine

## 2018-12-16 NOTE — Telephone Encounter (Signed)
Per records, PA for Repatha is good until 07/30/2019  Called [facility] to inquire about [facility] -- patient has changed from [place] to [facility] ID: [id number] [place]: [id number] PCN: [id number]

## 2018-12-16 NOTE — Telephone Encounter (Signed)
This is a dr hilty pt I will route to his RN Eliezer Lofts

## 2018-12-16 NOTE — Telephone Encounter (Signed)
[facility]'s Evolocumab (REPATHA SURECLICK) [id number] MG/ML SOAJ stating her she needs a prior auth for this medication.

## 2018-12-17 ENCOUNTER — Telehealth: Payer: Self-pay

## 2018-12-17 ENCOUNTER — Other Ambulatory Visit: Payer: Self-pay | Admitting: Obstetrics & Gynecology

## 2018-12-17 DIAGNOSIS — N951 Menopausal and female climacteric states: Secondary | ICD-10-CM

## 2018-12-17 MED ORDER — ESTRADIOL 0.5 MG PO TABS: 0.5000 mg | ORAL_TABLET | Freq: Every day | ORAL | 3 refills | Status: DC

## 2018-12-17 MED ORDER — MEDROXYPROGESTERONE ACETATE 2.5 MG PO TABS: 5.0000 mg | ORAL_TABLET | Freq: Every day | ORAL | 3 refills | Status: DC

## 2018-12-17 NOTE — Telephone Encounter (Signed)
PA for Repatha Sureclick submitted via [email or web address] (Key: [id number])

## 2018-12-17 NOTE — Telephone Encounter (Signed)
Patient called and made aware that Dr Ihor Dow sent in her two new scripts to replaced her prempro. Patient made aware that she will only take the provera 5 days out of the month. Patient scheduled for follow up with Dr. Ihor Dow to check on medication use. Kathrene Alu RN

## 2018-12-17 NOTE — Telephone Encounter (Signed)
Patient called stating that her Prempro 0.3 mg/1.5 tablet has increased in cost due to her switching insurances. Patient states it now costs her over 100. Patient wondering if there is an alternative she can take as this works well for her. Kathrene Alu RN

## 2018-12-18 ENCOUNTER — Telehealth: Payer: Self-pay | Admitting: Family Medicine

## 2018-12-18 ENCOUNTER — Other Ambulatory Visit: Payer: Self-pay | Admitting: Family Medicine

## 2018-12-18 DIAGNOSIS — M1990 Unspecified osteoarthritis, unspecified site: Secondary | ICD-10-CM

## 2018-12-18 NOTE — Telephone Encounter (Signed)
Patient is calling to request a refferal for a rheumologist she has new insurance [employer].   She has discussed this with Dr. Carollee Herter.  Please advise [phone]

## 2018-12-18 NOTE — Telephone Encounter (Signed)
Please advise. I have let patient know via Mychart that we need copies of her new insurance card.

## 2018-12-21 ENCOUNTER — Other Ambulatory Visit: Payer: Self-pay

## 2018-12-21 NOTE — Telephone Encounter (Signed)
Appeals letter composed/faxed

## 2018-12-21 NOTE — Telephone Encounter (Signed)
This request was denied because you did not meet the following clinical requirements: Based on the information provided, you do not meet the established medication-specific criteria or guidelines for Repatha Sureclick at this time. The request for coverage for Repatha sureclick 140mg /mL autoinjector solution for injection, use as directed (2 per 28 days), is denied. This decision is based on health plan criteria for Repatha. This medicine is covered only if:  (1) Submission of medical records (for example: chart notes, laboratory values) documenting one of the following:  (A) You have been receiving at least 12 consecutive weeks of high intensity statin therapy [i.e., atorvastatin 40-80 mg, rosuvastatin 20-40 mg] and will continue to receive a high intensity statin at maximally tolerated dose. (B) Both of the following: (i) You are unable to tolerate high-intensity statin as evidenced by one of the following intolerable and persistent (i.e. more than 2 weeks) symptoms: (a) Myalgia (muscle symptoms without creatine kinase elevations). (b) Myositis (muscle symptoms with creatine kinase elevations less than 10 times upper limit of normal [ULN]). (ii) One of the following: (a) You have been receiving at least 12 consecutive weeks of moderate-intensity [i.e., atorvastatin 10-20 mg, rosuvastatin 5-10 mg, simvastatin greater than or equal to 20 mg, pravastatin greater than or equal to 40 mg, lovastatin 40 mg, Lescol XL (fluvastatin XL) 80 mg, fluvastatin 40 mg twice daily or Livalo (pitavastatin) greater than or equal to 2 mg] and will continue to receive a moderateintensity statin at maximally tolerated dose. (b) You have been receiving at least 12 consecutive weeks of low-intensity [i.e. simvastatin 10 mg, pravastatin 10-20 mg, lovastatin 20 mg, fluvastatin 20-40 mg, or Livalo (pitavastatin) 1 mg] statin therapy and will continue to receive a low-intensity statin at maximally tolerated dose. (C)  You are unable to tolerate low or moderate-, and high-intensity statins as evidenced by one of the following: (i) One of the following intolerable and persistent (i.e. more than 2 weeks) symptoms for low or moderate-, and high-intensity statins: (a) Myalgia (muscle symptoms without creatine kinase elevations). (b) Myositis (muscle symptoms with creatine kinase elevations less than 10 times upper limit of normal [ULN]). (ii) You have a labeled contraindication to all statins as documented in medical records. (iii) You have experienced rhabdomyolysis or muscle symptoms with statin treatment with creatine kinase elevations greater than 10 times ULN.  (2) One of the following: (A) Submission of medical record (for example: laboratory values) documenting the following lowdensity lipoprotein cholesterol (LDL-C) values while on maximally tolerated lipid lowering therapy for a minimum of at least 12 weeks within the last 120 days: (i) low-density lipoprotein cholesterol (LDL-C) greater than or equal to 130 mg/dL. (B) Both of the following: (i) Submission of medical record (for example: laboratory values) documenting the following lowdensity lipoprotein cholesterol (LDL-C) values while on maximally tolerated lipid lowering therapy for a minimum of at least 12 weeks within the last 120 days: (a) low-density lipoprotein cholesterol (LDL-C) between 100 mg/dL and 129 mg/dL. (ii) Submission of medical record (for example: chart notes, laboratory values) documenting one of the following: (b) You have been receiving at least 12 consecutive weeks of ezetimibe (Zetia) therapy as adjunct to maximally tolerated statin therapy. (c) You have a history of contraindication, or intolerance to ezetimibe.   To file an appeal, please send any written comments, documents or other relevant documentation with your appeal to the address listed below: [facility] [address] [place]  [address] Phone: Please call the toll-free member number listed on your health  plan ID card. Fax: [phone] Expedited/Urgent Fax: [phone]

## 2018-12-23 NOTE — Telephone Encounter (Signed)
Original fax failed. Re-submitted

## 2018-12-24 ENCOUNTER — Ambulatory Visit: Payer: Self-pay | Admitting: Podiatry

## 2018-12-29 ENCOUNTER — Telehealth: Payer: Self-pay | Admitting: Internal Medicine

## 2018-12-29 ENCOUNTER — Ambulatory Visit: Payer: Self-pay | Admitting: Podiatry

## 2018-12-29 NOTE — Telephone Encounter (Signed)
Spoke with patient. Explained that repatha was denied and an appeal was faxed on 11/18 - her denial letter came on 11/16. Advised will call [facility] @ [phone] to follow up on appeal.   1 sample of repatha left for pick up

## 2018-12-29 NOTE — Telephone Encounter (Signed)
Called [employer] and initiated a new PA over the phone. Response will be within 96 hours  [id number]

## 2018-12-29 NOTE — Telephone Encounter (Signed)
New Message:   Please call, concerning her appeal letter for her [place].

## 2019-01-04 ENCOUNTER — Encounter: Payer: Self-pay | Admitting: Podiatry

## 2019-01-04 ENCOUNTER — Other Ambulatory Visit: Payer: Self-pay

## 2019-01-04 ENCOUNTER — Ambulatory Visit: Payer: 59

## 2019-01-04 ENCOUNTER — Ambulatory Visit: Payer: 59 | Admitting: Podiatry

## 2019-01-04 DIAGNOSIS — M79671 Pain in right foot: Secondary | ICD-10-CM

## 2019-01-04 DIAGNOSIS — M778 Other enthesopathies, not elsewhere classified: Secondary | ICD-10-CM | POA: Diagnosis not present

## 2019-01-04 DIAGNOSIS — M79672 Pain in left foot: Secondary | ICD-10-CM

## 2019-01-04 DIAGNOSIS — M779 Enthesopathy, unspecified: Secondary | ICD-10-CM | POA: Diagnosis not present

## 2019-01-04 NOTE — Progress Notes (Signed)
DARDNOTE Subjective:   Patient ID: Sonya [name], female   DOB: 58 y.o.   MRN: [id number]   HPI Patient presents stating she had seen Dr. Sharol Given and she has bone spurs on top of both feet left over right and they get sore and she also has bunions which are not significantly sore at the current time.  Patient states it is been going on for a while and it is gotten worse over the last year   Review of Systems  All other systems reviewed and are negative.       Objective:  Physical Exam Vitals signs and nursing note reviewed.  Constitutional:      Appearance: She is well-developed.  Pulmonary:     Effort: Pulmonary effort is normal.  Musculoskeletal: Normal range of motion.  Skin:    General: Skin is warm.  Neurological:     Mental Status: She is alert.     Neurovascular status found to be intact muscle strength found to be adequate range of motion within normal limits.  On top of both feet I did note prominence within the midtarsal joint but is difficult to say on the x-rays that she has which are nonweightbearing whether this is exactly where her problem is.  I did note there is some inflammatory tendinitis around this area and it does appear to be more spread out than just over spur formation     Assessment:  Difficult to make complete determination as to whether or not this is strictly a inflammatory condition whether it is related to bone spur or whether it is related to arthritis with structural bunions also noted     Plan:  H&P discussed all conditions with her and today I did sterile prep and injected the dorsal tendon complex 3 mg Kenalog 5 mg Xylocaine to reduce the inflammation and I want to see response before deciding anything else and most likely we will get x-rays at next visit.  Reappoint to recheck 3 weeks or earlier if needed

## 2019-01-04 NOTE — Progress Notes (Signed)
   Subjective:    Patient ID: Sonya [name], female    DOB: Oct 21, 1960, 58 y.o.   MRN: [id number]  HPI    Review of Systems  Endocrine: Positive for cold intolerance.  All other systems reviewed and are negative.      Objective:   Physical Exam        Assessment & Plan:

## 2019-01-06 NOTE — Telephone Encounter (Signed)
Per faxed correspondence, new PA initiated has been cancelled and appeals process suggested. Called [facility] PA dept to follow up on this and was provided with a different fax # [phone]) to send appeals info.

## 2019-01-16 ENCOUNTER — Other Ambulatory Visit: Payer: Self-pay | Admitting: Internal Medicine

## 2019-01-19 ENCOUNTER — Telehealth: Payer: Self-pay | Admitting: Internal Medicine

## 2019-01-19 NOTE — Telephone Encounter (Signed)
Rx has been sent to the pharmacy electronically. ° °

## 2019-01-19 NOTE — Telephone Encounter (Signed)
Patient calling the office for samples of medication:   1.  What medication and dosage are you requesting samples for? Evolocumab (REPATHA SURECLICK) [id number] MG/ML SOAJ  2.  Are you currently out of this medication? Yes   Insurance changed and they have not approved this medication yet. She is past due for injection.

## 2019-01-19 NOTE — Telephone Encounter (Signed)
Medication Samples have been provided to the patient.  Drug name: Repatha SureClick      Strength: 140mg          Qty: 2  [id number]  Exp.Date: 01/2021

## 2019-02-03 ENCOUNTER — Ambulatory Visit: Payer: 59 | Admitting: Podiatry

## 2019-02-08 ENCOUNTER — Other Ambulatory Visit: Payer: Self-pay | Admitting: Internal Medicine

## 2019-02-09 NOTE — Telephone Encounter (Signed)
*  STAT* If patient is at the pharmacy, call can be transferred to refill team.   1. Which medications need to be refilled? (please list name of each medication and dose if known) Evolocumab (REPATHA SURECLICK) [id number] MG/ML SOAJ  2. Which pharmacy/location (including street and city if local pharmacy) is medication to be sent to? [id number] - [place], [place] [address]  3. Do they need a 30 day or 90 day supply? 30 day

## 2019-02-11 ENCOUNTER — Ambulatory Visit
Admission: RE | Admit: 2019-02-11 | Discharge: 2019-02-11 | Disposition: A | Discharge status: Home / Self Care | Payer: 59 | Source: Ambulatory Visit | Attending: Family Medicine | Admitting: Family Medicine

## 2019-02-11 ENCOUNTER — Other Ambulatory Visit: Payer: Self-pay

## 2019-02-11 DIAGNOSIS — Z1231 Encounter for screening mammogram for malignant neoplasm of breast: Secondary | ICD-10-CM

## 2019-02-15 NOTE — Telephone Encounter (Signed)
repatha sureclick has been approved until Jan 22, 2020

## 2019-02-25 ENCOUNTER — Ambulatory Visit: Payer: Self-pay | Admitting: Rheumatology

## 2019-03-03 ENCOUNTER — Ambulatory Visit: Payer: Self-pay | Admitting: Obstetrics & Gynecology

## 2019-04-06 ENCOUNTER — Other Ambulatory Visit: Payer: Self-pay

## 2019-04-06 ENCOUNTER — Other Ambulatory Visit: Payer: Self-pay | Admitting: Family Medicine

## 2019-04-06 DIAGNOSIS — M79671 Pain in right foot: Secondary | ICD-10-CM

## 2019-04-06 DIAGNOSIS — M79672 Pain in left foot: Secondary | ICD-10-CM

## 2019-04-06 DIAGNOSIS — N951 Menopausal and female climacteric states: Secondary | ICD-10-CM

## 2019-04-06 MED ORDER — PREMPRO 0.3-1.5 MG PO TABS: 1.0000 | ORAL_TABLET | Freq: Every day | ORAL | 3 refills | Status: DC

## 2019-04-06 NOTE — Telephone Encounter (Signed)
Patient will need an appt for further refills.    

## 2019-04-07 ENCOUNTER — Telehealth: Payer: Self-pay

## 2019-04-07 NOTE — Progress Notes (Signed)
Office Visit Note  Patient: Sonya [name]             Date of Birth: 1960/06/18           MRN: [id number]             PCP: [name], DO Referring: [name], * Visit Date: 04/14/2019 Occupation: '@GUAROCC'$ @  Subjective:  New Patient (Initial Visit) (Joint pain)   History of Present Illness: Sonya [name] is a 59 y.o. female seen in consultation per request of her PCP.  According to patient her symptoms are started about 20 years ago with joint pain.  She states she was evaluated by rheumatologist in [place] who is retired now.  At the time she was diagnosed with fibromyalgia and osteoarthritis and there was no follow-up.  She continued to develop worsening of her arthritis over the years.  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  She states she continues to have discomfort in her bilateral hands, bilateral knee joints and her feet.  She was seen by by Dr. Sharol Given who recommended insoles for her shoes.  She was seen by Dr. Felisa Bonier who gave her steroid injections in her feet which is started working after 3 weeks and she noticed some improvement.  He also did x-rays and told her that she had some dorsal spurs.  She has occasional discomfort in her right shoulder as well.  She reports joint swelling in her hands.  There is history of osteoarthritis in her both parents.  Activities of Daily Living:  Patient reports morning stiffness for 3 hours.   Patient Reports nocturnal pain.  Difficulty dressing/grooming: Reports Difficulty climbing stairs: Reports Difficulty getting out of chair: Denies Difficulty using hands for taps, buttons, cutlery, and/or writing: Reports  Review of Systems  Constitutional: Positive for fatigue. Negative for night sweats, weight gain and weight loss.  HENT: Negative for mouth sores, trouble swallowing, trouble swallowing, mouth dryness and nose dryness.   Eyes: Positive for dryness. Negative for pain,  redness and visual disturbance.  Respiratory: Negative for cough, shortness of breath and difficulty breathing.   Cardiovascular: Negative for chest pain, palpitations, hypertension, irregular heartbeat and swelling in legs/feet.  Gastrointestinal: Negative for blood in stool, constipation and diarrhea.  Endocrine: Positive for heat intolerance. Negative for increased urination.  Genitourinary: Negative for difficulty urinating and vaginal dryness.  Musculoskeletal: Positive for arthralgias, gait problem, joint pain, joint swelling, morning stiffness and muscle tenderness. Negative for myalgias, muscle weakness and myalgias.  Skin: Negative for color change, rash, hair loss, skin tightness, ulcers and sensitivity to sunlight.  Allergic/Immunologic: Negative for susceptible to infections.  Neurological: Negative for dizziness, numbness, memory loss, night sweats and weakness.  Hematological: Negative for bruising/bleeding tendency and swollen glands.  Psychiatric/Behavioral: Positive for sleep disturbance. Negative for depressed mood. The patient is not nervous/anxious.     PMFS History:  Patient Active Problem List   Diagnosis Date Noted  . Agatston coronary artery calcium score greater than 400 04/20/2018  . 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 Family history of heart disease 12/26/2017  . Statin intolerance 12/26/2017  . Preventative health care 09/28/2017  . Arthritis 09/28/2017  . Essential hypertension 05/14/2016  . Generalized anxiety disorder 05/14/2016    Past Medical History:  Diagnosis Date  . Arthritis   . Fibromyalgia   . Hypertension     Family History  Problem Relation Age of Onset  . Arthritis  Mother   . Hyperlipidemia Mother   . Hypertension Mother   . Diabetes Mother   . Cancer - Other Mother        vulvar  . Arthritis Father   . Hyperlipidemia Father   . Heart failure Father        Hx disease  . Hypertension Father   . Stroke Father   . Prostate cancer  Father   . Macular degeneration Father   . Alzheimer's disease Father   . Lung cancer Paternal Grandfather        smoker  . Stomach cancer Maternal Grandmother   . Alcohol abuse Sister   . Subarachnoid hemorrhage Sister   . Heart attack Brother   . Healthy Son   . Healthy Son    Past Surgical History:  Procedure Laterality Date  . [place]   Social History   Social History Narrative  . Not on file   Immunization History  Administered Date(s) Administered  . Influenza Inj Mdck Quad Pf 12/06/2013  . Influenza,inj,Quad PF,6+ Mos 11/30/2014, 11/19/2016, 11/24/2017, 09/28/2018  . Influenza,trivalent, recombinat, inj, PF 01/01/2011, 11/19/2011, 11/26/2012  . Tdap 02/05/2011  . Zoster Recombinat (Shingrix) 09/22/2017, 11/24/2017     Objective: Vital Signs: BP 122/81 (BP Location: Right Arm, Patient Position: Sitting, Cuff Size: Normal)   Pulse (!) 59   Resp 14   Ht 5' 2.5" (1.588 m)   Wt 163 lb (73.9 kg)   LMP 02/23/2016   BMI 29.34 kg/m    Physical Exam Vitals and nursing note reviewed.  Constitutional:      Appearance: She is well-developed.  HENT:     Head: Normocephalic and atraumatic.  Eyes:     Conjunctiva/sclera: Conjunctivae normal.  Cardiovascular:     Rate and Rhythm: Normal rate and regular rhythm.     Heart sounds: Normal heart sounds.  Pulmonary:     Effort: Pulmonary effort is normal.     Breath sounds: Normal breath sounds.  Abdominal:     General: Bowel sounds are normal.     Palpations: Abdomen is soft.  Musculoskeletal:     Cervical back: Normal range of motion.  Lymphadenopathy:     Cervical: No cervical adenopathy.  Skin:    General: Skin is warm and dry.     Capillary Refill: Capillary refill takes less than 2 seconds.  Neurological:     Mental Status: She is alert and oriented to person, place, and time.  Psychiatric:        Behavior: Behavior normal.      Musculoskeletal Exam: C-spine thoracic and lumbar spine with  good range of motion.  Shoulder joints and elbow joints with good range of motion.  She had no synovitis of her wrist joints or MCP joints.  She has bilateral PIP and DIP thickening.  She also has PIP and DIP subluxation.  She has inflammatory changes in some of her PIP joints.  CMC arthritis was noted bilaterally.  No nail pitting or dystrophy was noted.  She has good range of motion of bilateral hip joints.  She is good range of motion of bilateral knee joints with some discomfort.  Ankle joints with good range of motion.  She has bilateral hallux valgus.  She also has DIP and PIP thickening.  She had bilateral dorsal spurs.  She has generalized hyperalgesia and few tender points.  CDAI Exam: CDAI Score: -- Patient Global: --; Provider Global: -- Swollen: --; Tender: -- Joint Exam 04/14/2019  No joint exam has been documented for this visit   There is currently no information documented on the homunculus. Go to the Rheumatology activity and complete the homunculus joint exam.  Investigation: No additional findings.  Imaging: No results found.  Recent Labs: Lab Results  Component Value Date   WBC 3.8 (L) 09/28/2018   HGB 14.1 09/28/2018   PLT 175.0 09/28/2018   NA 139 09/28/2018   K 3.6 09/28/2018   CL 100 09/28/2018   CO2 30 09/28/2018   GLUCOSE 95 09/28/2018   BUN 12 09/28/2018   CREATININE 0.69 09/28/2018   BILITOT 0.8 09/28/2018   ALKPHOS 68 09/28/2018   AST 19 09/28/2018   ALT 15 09/28/2018   PROT 6.3 09/28/2018   ALBUMIN 4.5 09/28/2018   CALCIUM 9.1 09/28/2018  September 28, 2018 ANA negative, RF negative  Speciality Comments: No specialty comments available.  Procedures:  No procedures performed Allergies: Atorvastatin, Livalo [pitavastatin], and Pravastatin   Assessment / Plan:     Visit Diagnoses: Pain in both hands -patient has progressively worsening arthritis in her hands for the last 20 years.  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  Plan: XR Hand 2 View Right, XR Hand 2 View Left, x-rays were consistent with severe osteoarthritis.  She has inflammatory component in her PIP joints.  We discussed possible cortisone injections in the future.  She is taking Celebrex which helps her to some extent.  Sedimentation rate, Uric acid, Cyclic citrul peptide antibody, IgG, 14-3-3 eta Protein, HLA-B27 antigen.  A handout on muscle strength exercises was given.  Joint protection was discussed.  A list of natural anti-inflammatories was given.  Chronic pain of both knees -she complains of pain and discomfort in her bilateral knee joints and difficulty walking.  No warmth swelling or effusion was noted.  Plan: XR KNEE 3 VIEW RIGHT, XR KNEE 3 VIEW LEFT.  X-ray showed moderate osteoarthritis and moderate chondromalacia patella.  No chondrocalcinosis was noted.  Handout on knee joint exercises was given.  Primary osteoarthritis of both feet-she has severe pain and discomfort in her bilateral feet and difficulty with mobility.  She has seen Dr. Sharol Given and Dr. Paulla Dolly in the past.  I reviewed x-rays done by Dr. Sharol Given which showed bilateral osteoarthritis, calcaneal spurs and dorsal spurs.  Fibromyalgia-she was diagnosed with fibromyalgia several years ago.  She continues to have some generalized muscle pain,  positive tender points and hyperalgesia.  Statin intolerance  Essential hypertension-blood pressure is controlled.  Agatston coronary artery calcium score greater than 400  Generalized anxiety disorder  Family history of heart disease  Familial hyperlipidemia  Orders: Orders Placed This Encounter  Procedures  . XR Hand 2 View Right  . XR Hand 2 View Left  . XR KNEE 3 VIEW RIGHT  . XR KNEE 3 VIEW LEFT  . Sedimentation rate  . Uric acid  . Cyclic citrul peptide antibody, IgG  . 14-3-3 eta Protein  . HLA-B27 antigen   No orders of the defined types were placed in this  encounter.   Face-to-face time spent with patient was 50 minutes. Greater than 50% of time was spent in counseling and coordination of care.  Follow-Up Instructions: Return for Osteoarthritis.   Bo Merino, MD  Note - This record has been created using Editor, commissioning.  Chart creation errors have been sought, but may not always  have been located. Such creation errors do not reflect on  the  standard of medical care.

## 2019-04-07 NOTE — Telephone Encounter (Signed)
Marissa called in from [facility] to see if Dr.Lowne could get the last notes from the previous Rheumatology visit updated to the referral.

## 2019-04-08 ENCOUNTER — Other Ambulatory Visit: Payer: Self-pay

## 2019-04-08 DIAGNOSIS — N951 Menopausal and female climacteric states: Secondary | ICD-10-CM

## 2019-04-08 MED ORDER — MEDROXYPROGESTERONE ACETATE 2.5 MG PO TABS: 5.0000 mg | ORAL_TABLET | Freq: Every day | ORAL | 3 refills | Status: DC

## 2019-04-08 NOTE — Telephone Encounter (Signed)
What does the patient need to do  Please advise

## 2019-04-08 NOTE — Telephone Encounter (Signed)
Who is doing referrals  Please advise

## 2019-04-08 NOTE — Telephone Encounter (Signed)
There are no notes on file that I can find for Rheumatology. In 2019 pt was referred to Dr. Amil Amen. I have called his office and pt has never been seen there. Pt was referred to [facility] Rheumatology in 2020 but January 2021 appt was cancelled. Pt is scheduled 04/14/2019. I updated referral and sent teams message to [facility].

## 2019-04-08 NOTE — Telephone Encounter (Signed)
Pt may have to get them-- if we do not have anything  Or if she knows where she went she may need to sign a release so we can have them faxed to [place]

## 2019-04-09 NOTE — Telephone Encounter (Signed)
We just need to let rheum no records are over 59 yrs old and she doesn't remember where she went

## 2019-04-09 NOTE — Telephone Encounter (Signed)
Spoke with patient she stated she went to see a Rheumatologist about 25 years ago and the guy is probably dead.

## 2019-04-14 ENCOUNTER — Ambulatory Visit: Payer: Self-pay

## 2019-04-14 ENCOUNTER — Ambulatory Visit: Payer: 59 | Admitting: Rheumatology

## 2019-04-14 ENCOUNTER — Encounter: Payer: Self-pay | Admitting: Rheumatology

## 2019-04-14 ENCOUNTER — Other Ambulatory Visit: Payer: Self-pay

## 2019-04-14 VITALS — BP 122/81 | HR 59 | Resp 14 | Ht 62.5 in | Wt 163.0 lb

## 2019-04-14 DIAGNOSIS — G8929 Other chronic pain: Secondary | ICD-10-CM

## 2019-04-14 DIAGNOSIS — Z8249 Family history of ischemic heart disease and other diseases of the circulatory system: Secondary | ICD-10-CM

## 2019-04-14 DIAGNOSIS — M19071 Primary osteoarthritis, right ankle and foot: Secondary | ICD-10-CM

## 2019-04-14 DIAGNOSIS — E7849 Other hyperlipidemia: Secondary | ICD-10-CM

## 2019-04-14 DIAGNOSIS — F411 Generalized anxiety disorder: Secondary | ICD-10-CM

## 2019-04-14 DIAGNOSIS — M19072 Primary osteoarthritis, left ankle and foot: Secondary | ICD-10-CM

## 2019-04-14 DIAGNOSIS — M25562 Pain in left knee: Secondary | ICD-10-CM | POA: Diagnosis not present

## 2019-04-14 DIAGNOSIS — M25561 Pain in right knee: Secondary | ICD-10-CM

## 2019-04-14 DIAGNOSIS — M797 Fibromyalgia: Secondary | ICD-10-CM | POA: Diagnosis not present

## 2019-04-14 DIAGNOSIS — M79641 Pain in right hand: Secondary | ICD-10-CM | POA: Diagnosis not present

## 2019-04-14 DIAGNOSIS — I10 Essential (primary) hypertension: Secondary | ICD-10-CM

## 2019-04-14 DIAGNOSIS — Z789 Other specified health status: Secondary | ICD-10-CM

## 2019-04-14 DIAGNOSIS — R931 Abnormal findings on diagnostic imaging of heart and coronary circulation: Secondary | ICD-10-CM

## 2019-04-14 DIAGNOSIS — M79642 Pain in left hand: Secondary | ICD-10-CM | POA: Diagnosis not present

## 2019-04-14 NOTE — Patient Instructions (Addendum)
Hand Exercises Hand exercises can be helpful for almost anyone. These exercises can strengthen the hands, improve flexibility and movement, and increase blood flow to the hands. These results can make work and daily tasks easier. Hand exercises can be especially helpful for people who have joint pain from arthritis or have nerve damage from overuse (carpal tunnel syndrome). These exercises can also help people who have injured a hand. Exercises Most of these hand exercises are gentle stretching and motion exercises. It is usually safe to do them often throughout the day. Warming up your hands before exercise may help to reduce stiffness. You can do this with gentle massage or by placing your hands in warm water for 10-15 minutes. It is normal to feel some stretching, pulling, tightness, or mild discomfort as you begin new exercises. This will gradually improve. Stop an exercise right away if you feel sudden, severe pain or your pain gets worse. Ask your health care provider which exercises are best for you. Knuckle bend or "claw" fist 1. Stand or sit with your arm, hand, and all five fingers pointed straight up. Make sure to keep your wrist straight during the exercise. 2. Gently bend your fingers down toward your palm until the tips of your fingers are touching the top of your palm. Keep your big knuckle straight and just bend the small knuckles in your fingers. 3. Hold this position for __________ seconds. 4. Straighten (extend) your fingers back to the starting position. Repeat this exercise 5-10 times with each hand. Full finger fist 1. Stand or sit with your arm, hand, and all five fingers pointed straight up. Make sure to keep your wrist straight during the exercise. 2. Gently bend your fingers into your palm until the tips of your fingers are touching the middle of your palm. 3. Hold this position for __________ seconds. 4. Extend your fingers back to the starting position, stretching every  joint fully. Repeat this exercise 5-10 times with each hand. Straight fist 1. Stand or sit with your arm, hand, and all five fingers pointed straight up. Make sure to keep your wrist straight during the exercise. 2. Gently bend your fingers at the big knuckle, where your fingers meet your hand, and the middle knuckle. Keep the knuckle at the tips of your fingers straight and try to touch the bottom of your palm. 3. Hold this position for __________ seconds. 4. Extend your fingers back to the starting position, stretching every joint fully. Repeat this exercise 5-10 times with each hand. Tabletop 1. Stand or sit with your arm, hand, and all five fingers pointed straight up. Make sure to keep your wrist straight during the exercise. 2. Gently bend your fingers at the big knuckle, where your fingers meet your hand, as far down as you can while keeping the small knuckles in your fingers straight. Think of forming a tabletop with your fingers. 3. Hold this position for __________ seconds. 4. Extend your fingers back to the starting position, stretching every joint fully. Repeat this exercise 5-10 times with each hand. Finger spread 1. Place your hand flat on a table with your palm facing down. Make sure your wrist stays straight as you do this exercise. 2. Spread your fingers and thumb apart from each other as far as you can until you feel a gentle stretch. Hold this position for __________ seconds. 3. Bring your fingers and thumb tight together again. Hold this position for __________ seconds. Repeat this exercise 5-10 times with each hand.   Making circles 1. Stand or sit with your arm, hand, and all five fingers pointed straight up. Make sure to keep your wrist straight during the exercise. 2. Make a circle by touching the tip of your thumb to the tip of your index finger. 3. Hold for __________ seconds. Then open your hand wide. 4. Repeat this motion with your thumb and each finger on your  hand. Repeat this exercise 5-10 times with each hand. Thumb motion 1. Sit with your forearm resting on a table and your wrist straight. Your thumb should be facing up toward the ceiling. Keep your fingers relaxed as you move your thumb. 2. Lift your thumb up as high as you can toward the ceiling. Hold for __________ seconds. 3. Bend your thumb across your palm as far as you can, reaching the tip of your thumb for the small finger (pinkie) side of your palm. Hold for __________ seconds. Repeat this exercise 5-10 times with each hand. Grip strengthening  1. Hold a stress ball or other soft ball in the middle of your hand. 2. Slowly increase the pressure, squeezing the ball as much as you can without causing pain. Think of bringing the tips of your fingers into the middle of your palm. All of your finger joints should bend when doing this exercise. 3. Hold your squeeze for __________ seconds, then relax. Repeat this exercise 5-10 times with each hand. Contact a health care provider if:  Your hand pain or discomfort gets much worse when you do an exercise.  Your hand pain or discomfort does not improve within 2 hours after you exercise. If you have any of these problems, stop doing these exercises right away. Do not do them again unless your health care provider says that you can. Get help right away if:  You develop sudden, severe hand pain or swelling. If this happens, stop doing these exercises right away. Do not do them again unless your health care provider says that you can. This information is not intended to replace advice given to you by your health care provider. Make sure you discuss any questions you have with your health care provider. Document Revised: 05/14/2018 Document Reviewed: 01/22/2018 Elsevier Patient Education  2020 [employer]  Journal for Nurse Practitioners, 15(4), 263-267. Retrieved November 10, 2017 from http://clinicalkey.com/nursing">  Knee Exercises Ask your  health care provider which exercises are safe for you. Do exercises exactly as told by your health care provider and adjust them as directed. It is normal to feel mild stretching, pulling, tightness, or discomfort as you do these exercises. Stop right away if you feel sudden pain or your pain gets worse. Do not begin these exercises until told by your health care provider. Stretching and range-of-motion exercises These exercises warm up your muscles and joints and improve the movement and flexibility of your knee. These exercises also help to relieve pain and swelling. Knee extension, prone 1. Lie on your abdomen (prone position) on a bed. 2. Place your left / right knee just beyond the edge of the surface so your knee is not on the bed. You can put a towel under your left / right thigh just above your kneecap for comfort. 3. Relax your leg muscles and allow gravity to straighten your knee (extension). You should feel a stretch behind your left / right knee. 4. Hold this position for __________ seconds. 5. Scoot up so your knee is supported between repetitions. Repeat __________ times. Complete this exercise __________ times a day.   Knee flexion, active  1. Lie on your back with both legs straight. If this causes back discomfort, bend your left / right knee so your foot is flat on the floor. 2. Slowly slide your left / right heel back toward your buttocks. Stop when you feel a gentle stretch in the front of your knee or thigh (flexion). 3. Hold this position for __________ seconds. 4. Slowly slide your left / right heel back to the starting position. Repeat __________ times. Complete this exercise __________ times a day. Quadriceps stretch, prone  1. Lie on your abdomen on a firm surface, such as a bed or padded floor. 2. Bend your left / right knee and hold your ankle. If you cannot reach your ankle or pant leg, loop a belt around your foot and grab the belt instead. 3. Gently pull your heel  toward your buttocks. Your knee should not slide out to the side. You should feel a stretch in the front of your thigh and knee (quadriceps). 4. Hold this position for __________ seconds. Repeat __________ times. Complete this exercise __________ times a day. Hamstring, supine 1. Lie on your back (supine position). 2. Loop a belt or towel over the ball of your left / right foot. The ball of your foot is on the walking surface, right under your toes. 3. Straighten your left / right knee and slowly pull on the belt to raise your leg until you feel a gentle stretch behind your knee (hamstring). ? Do not let your knee bend while you do this. ? Keep your other leg flat on the floor. 4. Hold this position for __________ seconds. Repeat __________ times. Complete this exercise __________ times a day. Strengthening exercises These exercises build strength and endurance in your knee. Endurance is the ability to use your muscles for a long time, even after they get tired. Quadriceps, isometric This exercise stretches the muscles in front of your thigh (quadriceps) without moving your knee joint (isometric). 1. Lie on your back with your left / right leg extended and your other knee bent. Put a rolled towel or small pillow under your knee if told by your health care provider. 2. Slowly tense the muscles in the front of your left / right thigh. You should see your kneecap slide up toward your hip or see increased dimpling just above the knee. This motion will push the back of the knee toward the floor. 3. For __________ seconds, hold the muscle as tight as you can without increasing your pain. 4. Relax the muscles slowly and completely. Repeat __________ times. Complete this exercise __________ times a day. Straight leg raises This exercise stretches the muscles in front of your thigh (quadriceps) and the muscles that move your hips (hip flexors). 1. Lie on your back with your left / right leg extended and  your other knee bent. 2. Tense the muscles in the front of your left / right thigh. You should see your kneecap slide up or see increased dimpling just above the knee. Your thigh may even shake a bit. 3. Keep these muscles tight as you raise your leg 4-6 inches (10-15 cm) off the floor. Do not let your knee bend. 4. Hold this position for __________ seconds. 5. Keep these muscles tense as you lower your leg. 6. Relax your muscles slowly and completely after each repetition. Repeat __________ times. Complete this exercise __________ times a day. Hamstring, isometric 1. Lie on your back on a firm surface. 2. Bend your   left / right knee about __________ degrees. 3. Dig your left / right heel into the surface as if you are trying to pull it toward your buttocks. Tighten the muscles in the back of your thighs (hamstring) to "dig" as hard as you can without increasing any pain. 4. Hold this position for __________ seconds. 5. Release the tension gradually and allow your muscles to relax completely for __________ seconds after each repetition. Repeat __________ times. Complete this exercise __________ times a day. Hamstring curls If told by your health care provider, do this exercise while wearing ankle weights. Begin with __________ lb weights. Then increase the weight by 1 lb (0.5 kg) increments. Do not wear ankle weights that are more than __________ lb. 1. Lie on your abdomen with your legs straight. 2. Bend your left / right knee as far as you can without feeling pain. Keep your hips flat against the floor. 3. Hold this position for __________ seconds. 4. Slowly lower your leg to the starting position. Repeat __________ times. Complete this exercise __________ times a day. Squats This exercise strengthens the muscles in front of your thigh and knee (quadriceps). 1. Stand in front of a table, with your feet and knees pointing straight ahead. You may rest your hands on the table for balance but  not for support. 2. Slowly bend your knees and lower your hips like you are going to sit in a chair. ? Keep your weight over your heels, not over your toes. ? Keep your lower legs upright so they are parallel with the table legs. ? Do not let your hips go lower than your knees. ? Do not bend lower than told by your health care provider. ? If your knee pain increases, do not bend as low. 3. Hold the squat position for __________ seconds. 4. Slowly push with your legs to return to standing. Do not use your hands to pull yourself to standing. Repeat __________ times. Complete this exercise __________ times a day. Wall slides This exercise strengthens the muscles in front of your thigh and knee (quadriceps). 1. Lean your back against a smooth wall or door, and walk your feet out 18-24 inches (46-61 cm) from it. 2. Place your feet hip-width apart. 3. Slowly slide down the wall or door until your knees bend __________ degrees. Keep your knees over your heels, not over your toes. Keep your knees in line with your hips. 4. Hold this position for __________ seconds. Repeat __________ times. Complete this exercise __________ times a day. Straight leg raises This exercise strengthens the muscles that rotate the leg at the hip and move it away from your body (hip abductors). 1. Lie on your side with your left / right leg in the top position. Lie so your head, shoulder, knee, and hip line up. You may bend your bottom knee to help you keep your balance. 2. Roll your hips slightly forward so your hips are stacked directly over each other and your left / right knee is facing forward. 3. Leading with your heel, lift your top leg 4-6 inches (10-15 cm). You should feel the muscles in your outer hip lifting. ? Do not let your foot drift forward. ? Do not let your knee roll toward the ceiling. 4. Hold this position for __________ seconds. 5. Slowly return your leg to the starting position. 6. Let your muscles  relax completely after each repetition. Repeat __________ times. Complete this exercise __________ times a day. Straight leg raises This exercise   stretches the muscles that move your hips away from the front of the pelvis (hip extensors). 1. Lie on your abdomen on a firm surface. You can put a pillow under your hips if that is more comfortable. 2. Tense the muscles in your buttocks and lift your left / right leg about 4-6 inches (10-15 cm). Keep your knee straight as you lift your leg. 3. Hold this position for __________ seconds. 4. Slowly lower your leg to the starting position. 5. Let your leg relax completely after each repetition. Repeat __________ times. Complete this exercise __________ times a day. This information is not intended to replace advice given to you by your health care provider. Make sure you discuss any questions you have with your health care provider. Document Revised: 11/11/2017 Document Reviewed: 11/11/2017 Elsevier Patient Education  2020 Elsevier Inc.  

## 2019-04-19 ENCOUNTER — Telehealth: Payer: Self-pay

## 2019-04-19 NOTE — Telephone Encounter (Signed)
Patient called and made aware that [employer] has arrived to the office via the [place] patient assistance program.   Patient will come pick up medications. Kathrene Alu RN

## 2019-04-21 LAB — HLA-B27 ANTIGEN: HLA-B27 Antigen: NEGATIVE

## 2019-04-21 LAB — CYCLIC CITRUL PEPTIDE ANTIBODY, IGG: Cyclic Citrullin Peptide Ab: <16 UNITS

## 2019-04-21 LAB — URIC ACID: Uric Acid, Serum: 4.2 mg/dL (ref 2.5–7.0)

## 2019-04-21 LAB — 14-3-3 ETA PROTEIN: 14-3-3 eta Protein: <0.2 ng/mL (ref <0.2)

## 2019-04-21 LAB — SEDIMENTATION RATE: Sed Rate: 2 mm/h (ref 0–30)

## 2019-04-22 NOTE — Progress Notes (Signed)
All the labs are normal.  I will discuss results at the follow-up visit.

## 2019-05-10 NOTE — Progress Notes (Deleted)
Office Visit Note  Patient: Sonya [name]             Date of Birth: 11/10/1960           MRN: [id number]             PCP: [name], DO Referring: [name], * Visit Date: 05/13/2019 Occupation: '@GUAROCC'$ @  Subjective:  No chief complaint on file.   History of Present Illness: Sonya [name] is a 59 y.o. female ***   Activities of Daily Living:  Patient reports morning stiffness for *** {minute/hour:19697}.   Patient {ACTIONS;DENIES/REPORTS:21021675::"Denies"} nocturnal pain.  Difficulty dressing/grooming: {ACTIONS;DENIES/REPORTS:21021675::"Denies"} Difficulty climbing stairs: {ACTIONS;DENIES/REPORTS:21021675::"Denies"} Difficulty getting out of chair: {ACTIONS;DENIES/REPORTS:21021675::"Denies"} Difficulty using hands for taps, buttons, cutlery, and/or writing: {ACTIONS;DENIES/REPORTS:21021675::"Denies"}  No Rheumatology ROS completed.   PMFS History:  Patient Active Problem List   Diagnosis Date Noted  . Agatston coronary artery calcium score greater than 400 04/20/2018  . 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 Family history of heart disease 12/26/2017  . Statin intolerance 12/26/2017  . Preventative health care 09/28/2017  . Arthritis 09/28/2017  . Essential hypertension 05/14/2016  . Generalized anxiety disorder 05/14/2016    Past Medical History:  Diagnosis Date  . Arthritis   . Fibromyalgia   . Hypertension     Family History  Problem Relation Age of Onset  . Arthritis Mother   . Hyperlipidemia Mother   . Hypertension Mother   . Diabetes Mother   . Cancer - Other Mother        vulvar  . Arthritis Father   . Hyperlipidemia Father   . Heart failure Father        Hx disease  . Hypertension Father   . Stroke Father   . Prostate cancer Father   . Macular degeneration Father   . Alzheimer's disease Father   . Lung cancer Paternal Grandfather        smoker  . Stomach cancer Maternal Grandmother   . Alcohol abuse Sister   .  Subarachnoid hemorrhage Sister   . Heart attack Brother   . Healthy Son   . Healthy Son    Past Surgical History:  Procedure Laterality Date  . [place]   Social History   Social History Narrative  . Not on file   Immunization History  Administered Date(s) Administered  . Influenza Inj Mdck Quad Pf 12/06/2013  . Influenza,inj,Quad PF,6+ Mos 11/30/2014, 11/19/2016, 11/24/2017, 09/28/2018  . Influenza,trivalent, recombinat, inj, PF 01/01/2011, 11/19/2011, 11/26/2012  . Tdap 02/05/2011  . Zoster Recombinat (Shingrix) 09/22/2017, 11/24/2017     Objective: Vital Signs: LMP 02/23/2016    Physical Exam   Musculoskeletal Exam: ***  CDAI Exam: CDAI Score: - Patient Global: -; Provider Global: - Swollen: -; Tender: - Joint Exam 05/13/2019   No joint exam has been documented for this visit   There is currently no information documented on the homunculus. Go to the Rheumatology activity and complete the homunculus joint exam.  Investigation: No additional findings.  Imaging: XR Hand 2 View Left  Result Date: 04/14/2019 Severe CMC narrowing and spurring was noted.  No MCP or intercarpal joint space narrowing was noted.  No radiocarpal joint space narrowing was noted.  Severe PIP and DIP narrowing was noted.  Subluxation of first and second DIP joints and third PIP joint was noted. 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  XR Hand 2 View Right  Result Date: 04/14/2019 Severe  PIP and DIP narrowing was noted.  Subluxation of first and second DIP joints and third PIP joint was noted.  Severe CMC narrowing was noted.  No MCP, intercarpal radiocarpal joint space narrowing was noted.  No erosive changes were noted.  Cystic changes were noted in the second DIP joint. 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  XR KNEE 3 VIEW LEFT  Result Date: 04/14/2019 Moderate medial compartment narrowing  with medial osteophytes was noted.  Intercondylar osteophytes were noted.  No chondrocalcinosis was noted.  Moderate patellofemoral narrowing was noted. 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  XR KNEE 3 VIEW RIGHT  Result Date: 04/14/2019 Moderate medial compartment narrowing with medial osteophytes was noted.  Intercondylar osteophytes were noted.  No chondrocalcinosis was noted.  Moderate patellofemoral narrowing was noted. 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   Recent Labs: Lab Results  Component Value Date   WBC 3.8 (L) 09/28/2018   HGB 14.1 09/28/2018   PLT 175.0 09/28/2018   NA 139 09/28/2018   K 3.6 09/28/2018   CL 100 09/28/2018   CO2 30 09/28/2018   GLUCOSE 95 09/28/2018   BUN 12 09/28/2018   CREATININE 0.69 09/28/2018   BILITOT 0.8 09/28/2018   ALKPHOS 68 09/28/2018   AST 19 09/28/2018   ALT 15 09/28/2018   PROT 6.3 09/28/2018   ALBUMIN 4.5 09/28/2018   CALCIUM 9.1 09/28/2018  April 14, 2019 ESR 2, anti-CCP negative, _0 eta negative, HLA-B27 negative, uric acid 4.2  Speciality Comments: No specialty comments available.  Procedures:  No procedures performed Allergies: Atorvastatin, Livalo [pitavastatin], and Pravastatin   Assessment / Plan:     Visit Diagnoses: No diagnosis found.  Orders: No orders of the defined types were placed in this encounter.  No orders of the defined types were placed in this encounter.   Face-to-face time spent with patient was *** minutes. Greater than 50% of time was spent in counseling and coordination of care.  Follow-Up Instructions: No follow-ups on file.   Bo Merino, MD  Note - This record has been created using Editor, commissioning.  Chart creation errors have been sought, but may not always  have been located. Such creation errors do not reflect on  the standard of medical  care.

## 2019-05-11 ENCOUNTER — Telehealth: Payer: Self-pay

## 2019-05-11 ENCOUNTER — Other Ambulatory Visit: Payer: Self-pay | Admitting: Internal Medicine

## 2019-05-11 DIAGNOSIS — R0789 Other chest pain: Secondary | ICD-10-CM

## 2019-05-11 DIAGNOSIS — E7849 Other hyperlipidemia: Secondary | ICD-10-CM

## 2019-05-11 NOTE — Telephone Encounter (Signed)
Patient called in to see if Dr. Etter Sjogren could send in a referral for a Cardiologist for   Sonya Relic, MD    Address [address], [place], [place] [address]   Phone number:  [phone]   Please follow up with the patient and advise when this is done please and thanks.

## 2019-05-11 NOTE — Telephone Encounter (Signed)
We need the reason  She is already pt in lipid clinic /cardiology

## 2019-05-11 NOTE — Telephone Encounter (Signed)
Please advise 

## 2019-05-12 NOTE — Telephone Encounter (Signed)
Spoke with patient. Pt states she was having "chest tingling" on and off. No other sxs. She was told at the lipid/cardiology clinic that she needed a referral. Please advise

## 2019-05-12 NOTE — Telephone Encounter (Signed)
Ok to refer.

## 2019-05-12 NOTE — Telephone Encounter (Signed)
Referral placed.

## 2019-05-13 ENCOUNTER — Encounter: Payer: Self-pay | Admitting: Internal Medicine

## 2019-05-13 ENCOUNTER — Ambulatory Visit: Payer: 59 | Admitting: Rheumatology

## 2019-05-13 ENCOUNTER — Other Ambulatory Visit: Payer: Self-pay

## 2019-05-13 ENCOUNTER — Ambulatory Visit (INDEPENDENT_AMBULATORY_CARE_PROVIDER_SITE_OTHER): Payer: 59 | Admitting: Internal Medicine

## 2019-05-13 VITALS — BP 130/86 | HR 56 | Ht 62.5 in | Wt 162.0 lb

## 2019-05-13 DIAGNOSIS — Z789 Other specified health status: Secondary | ICD-10-CM | POA: Diagnosis not present

## 2019-05-13 DIAGNOSIS — R931 Abnormal findings on diagnostic imaging of heart and coronary circulation: Secondary | ICD-10-CM | POA: Diagnosis not present

## 2019-05-13 DIAGNOSIS — E7849 Other hyperlipidemia: Secondary | ICD-10-CM | POA: Diagnosis not present

## 2019-05-13 DIAGNOSIS — Z8249 Family history of ischemic heart disease and other diseases of the circulatory system: Secondary | ICD-10-CM | POA: Diagnosis not present

## 2019-05-13 LAB — LIPID PANEL
Chol/HDL Ratio: 2.6 ratio (ref 0.0–4.4)
Cholesterol, Total: 182 mg/dL (ref 100–199)
HDL: 71 mg/dL (ref >39)
LDL Chol Calc (NIH): 98 mg/dL (ref 0–99)
Triglycerides: 67 mg/dL (ref 0–149)
VLDL Cholesterol Cal: 13 mg/dL (ref 5–40)

## 2019-05-13 NOTE — Progress Notes (Signed)
LIPID CLINIC CONSULT NOTE  Chief Complaint:  Hyperlipidemia, statin intolerance  Primary Care Physician: Carollee Herter, Alferd Apa, DO  HPI:  Sonya [name] is a 59 y.o. female who is being seen today for the evaluation of hyperlipidemia and statin intolerance at the request of [name], *. This is a very pleasant 59 yo female with a long-standing history of dyslipidemia.  She was initially noted to have elevated cholesterol in her 28s.  She says she is been on intermittent treatment since then but has had significant side effects on multiple statin medications including atorvastatin, pravastatin and most recently Livalo.  All of which caused joint pain and aches as well as recently flulike symptoms with Livalo.  She does have a family history of coronary disease in her mom who had aortic stenosis in her father who had a artery disease and congestive heart failure.  Her brother died of an MI at age 72.  She also has 2 children ages 26 and 70, neither of which have had cholesterol testing.  Her most recent lipid profile showed total cholesterol 324, glycerides 77, HDL 61 and LDL of 247.  In addition she has some hypertension and arthritis but negative markers for RA.  She has no known coronary disease.  She does have a history of moderate obesity but underwent significant weight loss and is made dietary changes and exercises regularly.  She is asymptomatic denying any chest pain or worsening shortness of breath.  She is a lifelong non-smoker and denies any alcohol or drug use.  Reports a low-cholesterol diet which is low in saturated fats.  04/20/2018  [name] returns today for follow-up.  She has been doing well on Repatha.  She has seen a marked improvement in her lipid profile.  Over the past several months on medication, her LDL has gone down from 247-128.  Most recently her total cholesterol is 213 with that triglycerides of 70 and HDL 74.  She notes some mild headache the day after  using the medication but otherwise no significant side effects.  Unfortunately her coronary artery calcium score was quite abnormal.  This showed multivessel coronary calcification with a calcium score of 633, which is a high risk finding.  I discussed this with her and she is does seem asymptomatic and is quite active.  I do not think there is evidence for ischemia evaluation, especially in light of the recent ischemia trial, which suggested that even with patients that had ischemic noninvasive testing, medical therapy was as good as coronary intervention.  That being said her goal LDL is less than 70 and if we can further optimize her would be helpful.  05/13/2019  [name] is seen today in follow-up.  Overall she seems to be doing well on Repatha and I added ezetimibe.  She has not had repeat lipids, in fact, her last lipids showed total cholesterol 193, triglycerides 78, HDL 70 and LDL 107.  Target LDL is less than 70 especially with her multivessel coronary calcification.  She did not undergo nuclear stress testing despite her high coronary calcium score due to the fact she was asymptomatic.  She continues to be physically active without anginal complaints.  She occasionally gets some left-sided chest twinging, which she got at rest the other day at a church service on Easter, but does not sound particularly anginal to me.  She is able to exercise and walk without shortness of breath or any limitation  PMHx:  Past Medical  History:  Diagnosis Date  . Arthritis   . Fibromyalgia   . Hypertension     Past Surgical History:  Procedure Laterality Date  . CESAREAN SECTION  1987    FAMHx:  Family History  Problem Relation Age of Onset  . Arthritis Mother   . Hyperlipidemia Mother   . Hypertension Mother   . Diabetes Mother   . Cancer - Other Mother        vulvar  . Arthritis Father   . Hyperlipidemia Father   . Heart failure Father        Hx disease  . Hypertension Father   . Stroke Father    . Prostate cancer Father   . Macular degeneration Father   . Alzheimer's disease Father   . Lung cancer Paternal Grandfather        smoker  . Stomach cancer Maternal Grandmother   . Alcohol abuse Sister   . Subarachnoid hemorrhage Sister   . Heart attack Brother   . Healthy Son   . Healthy Son     SOCHx:   reports that she has never smoked. She has never used smokeless tobacco. She reports that she does not drink alcohol or use drugs.  ALLERGIES:  Allergies  Allergen Reactions  . Atorvastatin Other (See Comments)    Leg cramps  . Livalo [Pitavastatin] Other (See Comments)    Flu like symptoms - 2 mg  . Pravastatin Other (See Comments)    40 mg -myalgias    ROS: Pertinent items noted in HPI and remainder of comprehensive ROS otherwise negative.  HOME MEDS: Current Outpatient Medications on File Prior to Visit  Medication Sig Dispense Refill  . ALPRAZolam (XANAX) 0.5 MG tablet TAKE 1 TABLET BY MOUTH THREE TIMES A DAY AS NEEDED 30 tablet 1  . Ascorbic Acid (VITAMIN C) 1000 MG tablet Take 1,000 mg by mouth daily.    Marland Kitchen aspirin 325 MG tablet Take 325 mg by mouth daily.    Marland Kitchen aspirin EC 81 MG tablet Take 81 mg by mouth daily.    Marland Kitchen azelastine (ASTELIN) 0.1 % nasal spray Place 2 sprays into both nostrils 2 (two) times daily. Use in each nostril as directed 30 mL 3  . Biotin 1000 MCG tablet Take 1,000 mcg by mouth 3 (three) times daily.    . celecoxib (CELEBREX) 200 MG capsule TAKE ONE CAPSULE BY MOUTH TWICE DAILY AS NEEDED FOR MODERATE PAIN 60 capsule 4  . [employer] OMEGA-3 KRILL OIL 500 MG CAPS Take 1 capsule by mouth daily.    . diclofenac sodium (VOLTAREN) 1 % GEL APPLY 1 APPLICATION TOPICALLY 4 (FOUR) TIMES DAILY AS NEEDED.*NOT COVERED* 100 g 2  . estradiol (ESTRACE) 0.5 MG tablet Take 1 tablet (0.5 mg total) by mouth daily. 30 tablet 3  . estrogen, conjugated,-medroxyprogesterone (PREMPRO) 0.3-1.5 MG tablet Take 1 tablet by mouth daily.    Marland Kitchen ezetimibe (ZETIA) 10 MG tablet TAKE 1  TABLET BY MOUTH EVERY DAY 90 tablet 2  . hydrochlorothiazide (HYDRODIURIL) 25 MG tablet TAKE 1 TABLET BY MOUTH EVERY DAY 90 tablet 1  . meclizine (ANTIVERT) 25 MG tablet Take 1 tablet (25 mg total) by mouth 3 (three) times daily as needed for dizziness. 30 tablet 0  . methocarbamol (ROBAXIN) 500 MG tablet Take 1 tablet (500 mg total) by mouth 4 (four) times daily. 30 tablet 1  . Multiple Vitamin (MULTIVITAMIN) tablet Take 1 tablet by mouth daily.    . Multiple Vitamins-Minerals (EQ VISION FORMULA 50+)  CAPS Take by mouth.    Marland Kitchen REPATHA SURECLICK [id number] MG/ML SOAJ INJECT 1 DOSE INTO THE SKIN EVERY 14 (FOURTEEN) DAYS. 2 pen 11  . Turmeric, Curcuma Longa, (CURCUMIN EXTRACT) POWD Take 48 mg by mouth daily.     No current facility-administered medications on file prior to visit.    LABS/IMAGING: No results found for this or any previous visit (from the past 48 hour(s)). No results found.  LIPID PANEL:    Component Value Date/Time   CHOL 193 09/28/2018 1436   CHOL 213 (H) 04/17/2018 1454   TRIG 78.0 09/28/2018 1436   HDL 70.10 09/28/2018 1436   HDL 70 04/17/2018 1454   CHOLHDL 3 09/28/2018 1436   VLDL 15.6 09/28/2018 1436   LDLCALC 107 (H) 09/28/2018 1436   LDLCALC 128 (H) 04/17/2018 1454    WEIGHTS: Wt Readings from Last 3 Encounters:  05/13/19 162 lb (73.5 kg)  04/14/19 163 lb (73.9 kg)  10/28/18 161 lb (73 kg)    VITALS: BP 130/86   Pulse (!) 56   Ht 5' 2.5" (1.588 m)   Wt 162 lb (73.5 kg)   LMP 02/23/2016   BMI 29.16 kg/m   EXAM: Deferred  EKG: deferred  ASSESSMENT: 1. 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 Family history of premature onset coronary disease 3. Statin intolerance 4. 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   Ms. Kluever has had some improvement in her lipids but still remains above target LDL.  I had added ezetimibe and I would like to repeat her lipids today.  She is not describing any anginal chest pain although had a high coronary artery calcium  score and multivessel coronary disease which is concerning for the risk of obstructive coronary disease.  She needs to continue to monitor symptoms for any angina and I would have a low threshold for cardiac catheterization if she becomes symptomatic.  I think coronary CT angiography would be complicated given her heavy calcification and nuclear stress testing may underestimate the true burden of ischemia.  Plan follow-up with me annually or sooner as necessary.  Pixie Casino, MD, [facility], [place] Director of the Advanced Lipid Disorders &  Cardiovascular Risk Reduction Clinic Diplomate of the American Board of Clinical Lipidology Attending Cardiologist  Direct Dial: [phone]  Fax: [phone]  Website:  [email or web address] 05/13/2019, 11:21 AM

## 2019-05-13 NOTE — Patient Instructions (Signed)
Medication Instructions:  NO CHANGES *If you need a refill on your cardiac medications before your next appointment, please call your pharmacy*   Lab Work: LIPID PANEL TODAY If you have labs (blood work) drawn today and your tests are completely normal, you will receive your results only by: Marland Kitchen MyChart Message (if you have MyChart) OR . A paper copy in the mail If you have any lab test that is abnormal or we need to change your treatment, we will call you to review the results.   Testing/Procedures: NONE   Follow-Up: At [facility], you and your health needs are our priority.  As part of our continuing mission to provide you with exceptional heart care, we have created designated Provider Care Teams.  These Care Teams include your primary Cardiologist (physician) and Advanced Practice Providers (APPs -  Physician Assistants and Nurse Practitioners) who all work together to provide you with the care you need, when you need it.  We recommend signing up for the patient portal called "MyChart".  Sign up information is provided on this After Visit Summary.  MyChart is used to connect with patients for Virtual Visits (Telemedicine).  Patients are able to view lab/test results, encounter notes, upcoming appointments, etc.  Non-urgent messages can be sent to your provider as well.   To learn more about what you can do with MyChart, go to [email or web address].    Your next appointment:   12 month(s)  The format for your next appointment:   In Person  Provider:    Dr. Lyman Bishop    Other Instructions

## 2019-05-24 ENCOUNTER — Encounter: Payer: Self-pay | Admitting: Cardiology

## 2019-05-24 ENCOUNTER — Encounter: Payer: Self-pay | Admitting: General Practice

## 2019-05-31 ENCOUNTER — Other Ambulatory Visit: Payer: Self-pay | Admitting: Family Medicine

## 2019-05-31 DIAGNOSIS — I10 Essential (primary) hypertension: Secondary | ICD-10-CM

## 2019-06-03 ENCOUNTER — Other Ambulatory Visit: Payer: Self-pay | Admitting: Family Medicine

## 2019-06-03 DIAGNOSIS — M79672 Pain in left foot: Secondary | ICD-10-CM

## 2019-06-03 DIAGNOSIS — M79671 Pain in right foot: Secondary | ICD-10-CM

## 2019-06-20 DIAGNOSIS — M19042 Primary osteoarthritis, left hand: Secondary | ICD-10-CM | POA: Insufficient documentation

## 2019-06-20 DIAGNOSIS — M19041 Primary osteoarthritis, right hand: Secondary | ICD-10-CM | POA: Insufficient documentation

## 2019-06-20 DIAGNOSIS — M170 Bilateral primary osteoarthritis of knee: Secondary | ICD-10-CM | POA: Insufficient documentation

## 2019-06-20 DIAGNOSIS — M19071 Primary osteoarthritis, right ankle and foot: Secondary | ICD-10-CM | POA: Insufficient documentation

## 2019-06-20 NOTE — Progress Notes (Deleted)
Office Visit Note  Patient: Sonya [name]             Date of Birth: [birth year]           MRN: [id number]             PCP: [name], DO Referring: [name], * Visit Date: 06/23/2019 Occupation: _0 @  Subjective:  No chief complaint on file.   History of Present Illness: KEILYN [name] is a 59 y.o. female ***   Activities of Daily Living:  Patient reports morning stiffness for *** {minute/hour:19697}.   Patient {ACTIONS;DENIES/REPORTS:21021675::"Denies"} nocturnal pain.  Difficulty dressing/grooming: {ACTIONS;DENIES/REPORTS:21021675::"Denies"} Difficulty climbing stairs: {ACTIONS;DENIES/REPORTS:21021675::"Denies"} Difficulty getting out of chair: {ACTIONS;DENIES/REPORTS:21021675::"Denies"} Difficulty using hands for taps, buttons, cutlery, and/or writing: {ACTIONS;DENIES/REPORTS:21021675::"Denies"}  No Rheumatology ROS completed.   PMFS History:  Patient Active Problem List   Diagnosis Date Noted  . [place]atston coronary artery calcium score greater than 400 04/20/2018  . 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 Family history of heart disease 12/26/2017  . Statin intolerance 12/26/2017  . Preventative health care 09/28/2017  . Arthritis 09/28/2017  . Essential hypertension 05/14/2016  . Generalized anxiety disorder 05/14/2016    Past Medical History:  Diagnosis Date  . Arthritis   . Fibromyalgia   . Hypertension     Family History  Problem Relation Age of Onset  . Arthritis Mother   . Hyperlipidemia Mother   . Hypertension Mother   . Diabetes Mother   . Cancer - Other Mother        vulvar  . Arthritis Father   . Hyperlipidemia Father   . Heart failure Father        Hx disease  . Hypertension Father   . Stroke Father   . Prostate cancer Father   . Macular degeneration Father   . Alzheimer's disease Father   . Lung cancer Paternal Grandfather        smoker  . Stomach cancer Maternal Grandmother   . Alcohol abuse Sister   .  Subarachnoid hemorrhage Sister   . Heart attack Brother   . Healthy Son   . Healthy Son    Past Surgical History:  Procedure Laterality Date  . [place]   Social History   Social History Narrative  . Not on file   Immunization History  Administered Date(s) Administered  . Influenza Inj Mdck Quad Pf 12/06/2013  . Influenza,inj,Quad PF,6+ Mos 11/30/2014, 11/19/2016, 11/24/2017, 09/28/2018  . Influenza,trivalent, recombinat, inj, PF 01/01/2011, 11/19/2011, 11/26/2012  . Tdap 02/05/2011  . Zoster Recombinat (Shingrix) 09/22/2017, 11/24/2017     Objective: Vital Signs: LMP 02/23/2016    Physical Exam   Musculoskeletal Exam: ***  CDAI Exam: CDAI Score: -- Patient Global: --; Provider Global: -- Swollen: --; Tender: -- Joint Exam 06/23/2019   No joint exam has been documented for this visit   There is currently no information documented on the homunculus. Go to the Rheumatology activity and complete the homunculus joint exam.  Investigation: No additional findings.  Imaging: No results found.  Recent Labs: Lab Results  Component Value Date   WBC 3.8 (L) 09/28/2018   HGB 14.1 09/28/2018   PLT 175.0 09/28/2018   NA 139 09/28/2018   K 3.6 09/28/2018   CL 100 09/28/2018   CO2 30 09/28/2018   GLUCOSE 95 09/28/2018   BUN 12 09/28/2018   CREATININE 0.69 09/28/2018   BILITOT 0.8 09/28/2018   ALKPHOS 68 09/28/2018   AST 19 09/28/2018  ALT 15 09/28/2018   PROT 6.3 09/28/2018   ALBUMIN 4.5 09/28/2018   CALCIUM 9.1 09/28/2018  April 14, 2019 ESR 2, uric acid 4.2, anti-CCP negative, _0 eta negative, HLA-B27 negative September 28, 2018 ANA negative, RF negative    Speciality Comments: No specialty comments available.  Procedures:  No procedures performed Allergies: Atorvastatin, Livalo [pitavastatin], and Pravastatin   Assessment / Plan:     Visit Diagnoses: No diagnosis found.  Orders: No orders of the defined types were placed in this  encounter.  No orders of the defined types were placed in this encounter.   Face-to-face time spent with patient was *** minutes. Greater than 50% of time was spent in counseling and coordination of care.  Follow-Up Instructions: No follow-ups on file.   Bo Merino, MD  Note - This record has been created using Editor, commissioning.  Chart creation errors have been sought, but may not always  have been located. Such creation errors do not reflect on  the standard of medical care.

## 2019-06-23 ENCOUNTER — Ambulatory Visit: Payer: 59 | Admitting: Rheumatology

## 2019-07-01 ENCOUNTER — Telehealth: Payer: Self-pay

## 2019-07-01 NOTE — Telephone Encounter (Signed)
Called [employer] automated service to refill pt's Prempro 0.3-1.5 mg tablets. Medication will arrive within 7-10 business days. Order ID is [id number].  Sonya [name] l Russ Looper, CMA

## 2019-07-13 ENCOUNTER — Telehealth: Payer: Self-pay

## 2019-07-13 NOTE — Telephone Encounter (Signed)
Patient called checking on her Patient assistance program refill with [employer]. Called [employer] and they confirmed that prempro was mailed out on 07-07-19 and the tracking number is [id number]. Should arrive at the office between 7-10 days from mailing date of 07-07-19.  Patient will have to have refilled called again in August  [employer] phone [phone]  FF#[id number]  Kathrene Alu RN

## 2019-07-22 NOTE — Telephone Encounter (Signed)
Dance movement psychotherapist and spoke to [place]. Shanon Brow states pt's Prempro was returned to sender because there wasn't a suite number listed. Shanon Brow states he will fax a change request form. Once fax is received address will be updated.  chiquita l wilson, CMA

## 2019-07-23 NOTE — Telephone Encounter (Signed)
[employer] Medication change form was faxed with updated suite number.  Sonya [name] l Sonya [name], CMA

## 2019-07-30 ENCOUNTER — Telehealth: Payer: Self-pay

## 2019-07-30 NOTE — Telephone Encounter (Signed)
A medication change request form was faxed on 07/23/19.Spoke with Alden Benjamin at [employer] to confirm pt's medication order. Alden Benjamin states that they received the medication change request form with the correct suite number but the medication was sent out before the form was processed. I informed Alden Benjamin that the pt has now been waiting on her medication for a month. Per Alden Benjamin, the medication should be received in 7-10 business days from 07/26/19. Corvette Orser l Ainsley Sanguinetti, CMA

## 2019-09-10 ENCOUNTER — Other Ambulatory Visit: Payer: Self-pay | Admitting: Family Medicine

## 2019-09-10 DIAGNOSIS — M19041 Primary osteoarthritis, right hand: Secondary | ICD-10-CM

## 2019-09-10 DIAGNOSIS — M19042 Primary osteoarthritis, left hand: Secondary | ICD-10-CM

## 2019-09-27 ENCOUNTER — Ambulatory Visit (INDEPENDENT_AMBULATORY_CARE_PROVIDER_SITE_OTHER): Payer: 59

## 2019-09-27 ENCOUNTER — Other Ambulatory Visit: Payer: Self-pay

## 2019-09-27 ENCOUNTER — Ambulatory Visit: Payer: 59 | Admitting: Podiatry

## 2019-09-27 ENCOUNTER — Encounter: Payer: Self-pay | Admitting: Podiatry

## 2019-09-27 DIAGNOSIS — M778 Other enthesopathies, not elsewhere classified: Secondary | ICD-10-CM | POA: Diagnosis not present

## 2019-09-27 DIAGNOSIS — M779 Enthesopathy, unspecified: Secondary | ICD-10-CM

## 2019-09-29 NOTE — Progress Notes (Signed)
Subjective:   Patient ID: Sonya [name], female   DOB: 59 y.o.   MRN: [id number]   HPI Patient states she is taking care of both her parents and states that she has a lot of pain on top of her feet and bunion pain and she knows someday she is getting need something done but she simply cannot do it at the current time   ROS      Objective:  Physical Exam  Neurovascular status intact with moderate to intense discomfort on the dorsal of the midfoot bilateral with swelling noted around the midfoot bilateral and moderate bunion deformity     Assessment:  Midfoot tendinitis with probable also structural changes secondary to arthritis of the joint surfaces with structural bunion also noted     Plan:  H&P reviewed all conditions.  At this point I did do sterile prep and injected the extensor complex bilateral 3 mg Kenalog 5 mg Xylocaine and recommended topical medication.  Discussed possible CT or MRI in future with the possibility for fusion of the midfoot when she is able to do this

## 2019-10-04 ENCOUNTER — Other Ambulatory Visit: Payer: Self-pay

## 2019-10-04 ENCOUNTER — Encounter: Payer: Self-pay | Admitting: Family Medicine

## 2019-10-04 ENCOUNTER — Ambulatory Visit (INDEPENDENT_AMBULATORY_CARE_PROVIDER_SITE_OTHER): Payer: 59 | Admitting: Family Medicine

## 2019-10-04 VITALS — BP 106/70 | HR 63 | Temp 98.0°F | Resp 18 | Ht 62.5 in | Wt 156.8 lb

## 2019-10-04 DIAGNOSIS — E785 Hyperlipidemia, unspecified: Secondary | ICD-10-CM

## 2019-10-04 DIAGNOSIS — Z1211 Encounter for screening for malignant neoplasm of colon: Secondary | ICD-10-CM

## 2019-10-04 DIAGNOSIS — Z0000 Encounter for general adult medical examination without abnormal findings: Secondary | ICD-10-CM

## 2019-10-04 DIAGNOSIS — Z23 Encounter for immunization: Secondary | ICD-10-CM

## 2019-10-04 DIAGNOSIS — Z801 Family history of malignant neoplasm of trachea, bronchus and lung: Secondary | ICD-10-CM

## 2019-10-04 DIAGNOSIS — Z79899 Other long term (current) drug therapy: Secondary | ICD-10-CM

## 2019-10-04 DIAGNOSIS — F411 Generalized anxiety disorder: Secondary | ICD-10-CM

## 2019-10-04 MED ORDER — NONFORMULARY OR COMPOUNDED ITEM: 0 refills | Status: DC

## 2019-10-04 MED ORDER — ALPRAZOLAM 0.5 MG PO TABS: 0.5000 mg | ORAL_TABLET | Freq: Three times a day (TID) | ORAL | 1 refills | Status: AC | PRN

## 2019-10-04 NOTE — Patient Instructions (Signed)

## 2019-10-04 NOTE — Progress Notes (Signed)
Subjective:     Sonya [name] is a 59 y.o. female and is here for a comprehensive physical exam. The patient reports her mother was just dx with lung cancer and she never smoked--- pt is requesting testing/ screening due to this      Social History   Socioeconomic History  . Marital status: Married    Spouse name: Not on file  . Number of children: Not on file  . Years of education: Not on file  . Highest education level: Not on file  Occupational History  . Not on file  Tobacco Use  . Smoking status: Never Smoker  . Smokeless tobacco: Never Used  Vaping Use  . Vaping Use: Never used  Substance and Sexual Activity  . Alcohol use: No  . Drug use: No  . Sexual activity: Not Currently    Birth control/protection: None  Other Topics Concern  . Not on file  Social History Narrative  . Not on file   Social Determinants of Health   Financial Resource Strain:   . Difficulty of Paying Living Expenses: Not on file  Food Insecurity:   . Worried About Charity fundraiser in the Last Year: Not on file  . Ran Out of Food in the Last Year: Not on file  Transportation Needs:   . Lack of Transportation (Medical): Not on file  . Lack of Transportation (Non-Medical): Not on file  Physical Activity:   . Days of Exercise per Week: Not on file  . Minutes of Exercise per Session: Not on file  Stress:   . Feeling of Stress : Not on file  Social Connections:   . Frequency of Communication with Friends and Family: Not on file  . Frequency of Social Gatherings with Friends and Family: Not on file  . Attends Religious Services: Not on file  . Active Member of Clubs or Organizations: Not on file  . Attends Archivist Meetings: Not on file  . Marital Status: Not on file  Intimate Partner Violence:   . Fear of Current or Ex-Partner: Not on file  . Emotionally Abused: Not on file  . Physically Abused: Not on file  . Sexually Abused: Not on file   Health Maintenance  Topic  Date Due  . COLONOSCOPY  Never done  . INFLUENZA VACCINE  09/05/2019  . HIV Screening  09/23/2023 (Originally 01/26/1976)  . MAMMOGRAM  02/11/2020  . TETANUS/TDAP  02/04/2021  . PAP SMEAR-Modifier  10/27/2021  . Hepatitis C Screening  Completed    The following portions of the patient's history were reviewed and updated as appropriate:  She  has a past medical history of Arthritis, Fibromyalgia, and Hypertension. She does not have any pertinent problems on file. She  has a past surgical history that includes Cesarean section (1987). Her family history includes Alcohol abuse in her sister; Alzheimer's disease in her father; Aortic stenosis in her mother; Arthritis in her father and mother; Cancer - Other in her mother; Diabetes in her mother; Healthy in her son and son; Heart attack in her brother; Heart failure in her father; Hyperlipidemia in her father and mother; Hypertension in her father and mother; Lung cancer in her mother and paternal grandfather; Macular degeneration in her father; Prostate cancer in her father; Stomach cancer in her maternal grandmother; Stroke in her father; Subarachnoid hemorrhage in her sister. She  reports that she has never smoked. She has never used smokeless tobacco. She reports that she does not  drink alcohol and does not use drugs. She has a current medication list which includes the following prescription(s): alprazolam, vitamin c, aspirin, aspirin ec, azelastine, biotin, celecoxib, cvs omega-3 krill oil, diclofenac sodium, estradiol, prempro, ezetimibe, hydrochlorothiazide, meclizine, methocarbamol, multivitamin, eq vision formula 57+, repatha sureclick, curcumin extract, and NONFORMULARY OR COMPOUNDED ITEM. Current Outpatient Medications on File Prior to Visit  Medication Sig Dispense Refill  . Ascorbic Acid (VITAMIN C) 1000 MG tablet Take 1,000 mg by mouth daily.    Marland Kitchen aspirin 325 MG tablet Take 325 mg by mouth daily.    Marland Kitchen aspirin EC 81 MG tablet Take 81 mg  by mouth daily.    Marland Kitchen azelastine (ASTELIN) 0.1 % nasal spray Place 2 sprays into both nostrils 2 (two) times daily. Use in each nostril as directed 30 mL 3  . Biotin 1000 MCG tablet Take 1,000 mcg by mouth 3 (three) times daily.    . celecoxib (CELEBREX) 200 MG capsule Take 1 capsule (200 mg total) by mouth 2 (two) times daily as needed for moderate pain. 60 capsule 0  . CVS OMEGA-3 KRILL OIL 500 MG CAPS Take 1 capsule by mouth daily.    . diclofenac sodium (VOLTAREN) 1 % GEL APPLY 1 APPLICATION TOPICALLY 4 (FOUR) TIMES DAILY AS NEEDED.*NOT COVERED* 100 g 2  . estradiol (ESTRACE) 0.5 MG tablet Take 1 tablet (0.5 mg total) by mouth daily. 30 tablet 3  . estrogen, conjugated,-medroxyprogesterone (PREMPRO) 0.3-1.5 MG tablet Take 1 tablet by mouth daily.    Marland Kitchen ezetimibe (ZETIA) 10 MG tablet TAKE 1 TABLET BY MOUTH EVERY DAY 90 tablet 2  . hydrochlorothiazide (HYDRODIURIL) 25 MG tablet TAKE 1 TABLET BY MOUTH EVERY DAY 90 tablet 1  . meclizine (ANTIVERT) 25 MG tablet Take 1 tablet (25 mg total) by mouth 3 (three) times daily as needed for dizziness. 30 tablet 0  . methocarbamol (ROBAXIN) 500 MG tablet TAKE 1 TABLET (500 MG TOTAL) BY MOUTH 4 (FOUR) TIMES DAILY. 30 tablet 1  . Multiple Vitamin (MULTIVITAMIN) tablet Take 1 tablet by mouth daily.    . Multiple Vitamins-Minerals (EQ VISION FORMULA 50+) CAPS Take by mouth.    Marland Kitchen REPATHA SURECLICK 322 MG/ML SOAJ INJECT 1 DOSE INTO THE SKIN EVERY 14 (FOURTEEN) DAYS. 2 pen 11  . Turmeric, Curcuma Longa, (CURCUMIN EXTRACT) POWD Take 48 mg by mouth daily.     No current facility-administered medications on file prior to visit.   She is allergic to atorvastatin, livalo [pitavastatin], and pravastatin..  Review of Systems Review of Systems  Constitutional: Negative for activity change, appetite change and fatigue.  HENT: Negative for hearing loss, congestion, tinnitus and ear discharge.  dentist q31m Eyes: Negative for visual disturbance (see optho q1y -- vision  corrected to 20/20 with glasses).  Respiratory: Negative for cough, chest tightness and shortness of breath.   Cardiovascular: Negative for chest pain, palpitations and leg swelling.  Gastrointestinal: Negative for abdominal pain, diarrhea, constipation and abdominal distention.  Genitourinary: Negative for urgency, frequency, decreased urine volume and difficulty urinating.  Musculoskeletal: Negative for back pain, arthralgias and gait problem.  Skin: Negative for color change, pallor and rash.  Neurological: Negative for dizziness, light-headedness, numbness and headaches.  Hematological: Negative for adenopathy. Does not bruise/bleed easily.  Psychiatric/Behavioral: Negative for suicidal ideas, confusion, sleep disturbance, self-injury, dysphoric mood, decreased concentration and agitation.       Objective:    BP 106/70 (BP Location: Right Arm, Patient Position: Sitting, Cuff Size: Normal)   Pulse 63   Temp  98 F (36.7 C) (Oral)   Resp 18   Ht 5' 2.5" (1.588 m)   Wt 156 lb 12.8 oz (71.1 kg)   LMP 02/23/2016   SpO2 96%   BMI 28.22 kg/m  General appearance: alert, cooperative, appears stated age and no distress Head: Normocephalic, without obvious abnormality, atraumatic Eyes: negative findings: lids and lashes normal, conjunctivae and sclerae normal and pupils equal, round, reactive to light and accomodation Ears: normal TM's and external ear canals both ears Neck: no adenopathy, no carotid bruit, no JVD, supple, symmetrical, trachea midline and thyroid not enlarged, symmetric, no tenderness/mass/nodules Back: symmetric, no curvature. ROM normal. No CVA tenderness. Lungs: clear to auscultation bilaterally Breasts: gyn Heart: regular rate and rhythm, S1, S2 normal, no murmur, click, rub or gallop Abdomen: soft, non-tender; bowel sounds normal; no masses,  no organomegaly Pelvic: deferred ---gyn Extremities: extremities normal, atraumatic, no cyanosis or edema Pulses: 2+ and  symmetric Skin: Skin color, texture, turgor normal. No rashes or lesions Lymph nodes: Cervical, supraclavicular, and axillary nodes normal. Neurologic: Alert and oriented X 3, normal strength and tone. Normal symmetric reflexes. Normal coordination and gait    Assessment:    Healthy female exam.      Plan:    ghm utd Check labs  See After Visit Summary for Counseling Recommendations    1. Generalized anxiety disorder Stable  - ALPRAZolam (XANAX) 0.5 MG tablet; Take 1 tablet (0.5 mg total) by mouth 3 (three) times daily as needed.  Dispense: 30 tablet; Refill: 1  2. Preventative health care See above  - Lipid panel; Future - TSH; Future - Comprehensive metabolic panel; Future - CBC with Differential/Platelet; Future - CBC with Differential/Platelet - Comprehensive metabolic panel - TSH - Lipid panel  3. Colon cancer screening   - Ambulatory referral to Gastroenterology  4. Family history of lung cancer   - CT CHEST LUNG CA SCREEN LOW DOSE W/O CM; Future - Ambulatory referral to Genetics  5. Dyslipidemia Encouraged heart healthy diet, increase exercise, avoid trans fats, consider a krill oil cap daily---per lipid clinic - Ambulatory referral to Genetics  6. Need for influenza vaccination   - Flu Vaccine QUAD 36+ mos IM

## 2019-10-05 LAB — COMPREHENSIVE METABOLIC PANEL
AG Ratio: 2.3 (calc) (ref 1.0–2.5)
ALT: 29 U/L (ref 6–29)
AST: 22 U/L (ref 10–35)
Albumin: 4.2 g/dL (ref 3.6–5.1)
Alkaline phosphatase (APISO): 57 U/L (ref 37–153)
BUN: 12 mg/dL (ref 7–25)
CO2: 29 mmol/L (ref 20–32)
Calcium: 9.1 mg/dL (ref 8.6–10.4)
Chloride: 102 mmol/L (ref 98–110)
Creat: 0.69 mg/dL (ref 0.50–1.05)
Globulin: 1.8 g/dL (calc) — ABNORMAL LOW (ref 1.9–3.7)
Glucose, Bld: 95 mg/dL (ref 65–99)
Potassium: 3.6 mmol/L (ref 3.5–5.3)
Sodium: 139 mmol/L (ref 135–146)
Total Bilirubin: 0.8 mg/dL (ref 0.2–1.2)
Total Protein: 6 g/dL — ABNORMAL LOW (ref 6.1–8.1)

## 2019-10-05 LAB — CBC WITH DIFFERENTIAL/PLATELET
Absolute Monocytes: 371 cells/uL (ref 200–950)
Basophils Absolute: 53 cells/uL (ref 0–200)
Basophils Relative: 1 %
Eosinophils Absolute: 69 cells/uL (ref 15–500)
Eosinophils Relative: 1.3 %
HCT: 42.1 % (ref 35.0–45.0)
Hemoglobin: 14.1 g/dL (ref 11.7–15.5)
Lymphs Abs: 1277 cells/uL (ref 850–3900)
MCH: 30.1 pg (ref 27.0–33.0)
MCHC: 33.5 g/dL (ref 32.0–36.0)
MCV: 90 fL (ref 80.0–100.0)
MPV: 11.3 fL (ref 7.5–12.5)
Monocytes Relative: 7 %
Neutro Abs: 3530 cells/uL (ref 1500–7800)
Neutrophils Relative %: 66.6 %
Platelets: 187 10*3/uL (ref 140–400)
RBC: 4.68 10*6/uL (ref 3.80–5.10)
RDW: 12.2 % (ref 11.0–15.0)
Total Lymphocyte: 24.1 %
WBC: 5.3 10*3/uL (ref 3.8–10.8)

## 2019-10-05 LAB — LIPID PANEL
Cholesterol: 168 mg/dL (ref <200)
HDL: 68 mg/dL (ref >=50)
LDL Cholesterol (Calc): 83 mg/dL (calc)
Non-HDL Cholesterol (Calc): 100 mg/dL (calc) (ref <130)
Total CHOL/HDL Ratio: 2.5 (calc) (ref <5.0)
Triglycerides: 76 mg/dL (ref <150)

## 2019-10-05 LAB — TSH: TSH: 1.63 mIU/L (ref 0.40–4.50)

## 2019-10-05 NOTE — Addendum Note (Signed)
Addended by: Sanda Linger on: 10/05/2019 10:30 AM   Modules accepted: Orders

## 2019-10-06 LAB — DRUG MONITORING, PANEL 8 WITH CONFIRMATION, URINE
6 Acetylmorphine: NEGATIVE ng/mL (ref <10)
Alcohol Metabolites: NEGATIVE ng/mL
Amphetamines: NEGATIVE ng/mL (ref <500)
Benzodiazepines: NEGATIVE ng/mL (ref <100)
Buprenorphine, Urine: NEGATIVE ng/mL (ref <5)
Cocaine Metabolite: NEGATIVE ng/mL (ref <150)
Creatinine: 84.1 mg/dL
MDMA: NEGATIVE ng/mL (ref <500)
Marijuana Metabolite: NEGATIVE ng/mL (ref <20)
Opiates: NEGATIVE ng/mL (ref <100)
Oxidant: NEGATIVE ug/mL
Oxycodone: NEGATIVE ng/mL (ref <100)
pH: 7.3 (ref 4.5–9.0)

## 2019-10-06 LAB — DM TEMPLATE

## 2019-10-15 ENCOUNTER — Telehealth: Payer: Self-pay

## 2019-10-15 NOTE — Telephone Encounter (Signed)
Pt called requesting a refill of Prempro through  Patient assistance Program. Called [employer] and order medication via automated service.   Medicaiton will arrive in 7-10 business days. Next refill is due Wednesday November 17,2021. ID# [id number] chiquita l wilson, CMA

## 2019-10-27 ENCOUNTER — Other Ambulatory Visit: Payer: Self-pay | Admitting: Family Medicine

## 2019-10-27 ENCOUNTER — Telehealth: Payer: Self-pay | Admitting: Family Medicine

## 2019-10-27 DIAGNOSIS — G4700 Insomnia, unspecified: Secondary | ICD-10-CM

## 2019-10-27 DIAGNOSIS — F418 Other specified anxiety disorders: Secondary | ICD-10-CM

## 2019-10-27 MED ORDER — LORAZEPAM 0.5 MG PO TABS: 0.5000 mg | ORAL_TABLET | Freq: Two times a day (BID) | ORAL | 1 refills | Status: DC | PRN

## 2019-10-27 NOTE — Telephone Encounter (Signed)
Spoke with pharmacy. Pt picked up the Xanax on 08/30. I cancelled the refill.

## 2019-10-27 NOTE — Telephone Encounter (Signed)
Please cancel refills for xanax at pharmacy I will send in ativan once I know rx cancelled and it was not refilled

## 2019-10-27 NOTE — Telephone Encounter (Signed)
Xanax cancelled and ativan sent in  Will need ov --- esp if ativan not helping

## 2019-10-27 NOTE — Telephone Encounter (Signed)
Patient states she would like a call back from [place]  Patient states in regards to medication,(sleep).

## 2019-10-27 NOTE — Telephone Encounter (Signed)
Spoke with patient. Pt states her mother passed away last week and she would like the Xanax switched to Ativan to help with sleep. Pt states the xanax isn't help as much with sleep anymore. I did advised that you may want a visit but I would send a message first. [facility] [address] Please advise

## 2019-11-02 ENCOUNTER — Telehealth: Payer: Self-pay

## 2019-11-02 NOTE — Telephone Encounter (Signed)
Spoke with Ysidro Evert at [facility] regarding the pt's Prempro refill that was placed on 10/15/19.  Ysidro Evert states that the medication was shipped via [place] on 10/21/19 but will be returned because there was confusion with the suite number. A corrected form was faxed to [place] on 07/22/19. Ysidro Evert states that he did locate the form but is not sure why the suite wasn't updated in their system. Per Jeremy,the order will take 7-10 business days to arrive, he was not able to give an order number at this time.  Piper Hassebrock l Mendel Binsfeld, CMA

## 2019-11-03 NOTE — Telephone Encounter (Signed)
Called pt to discuss problem with Prempro delivery. Pt made aware that Per Ysidro Evert at [facility], her medication will be delivered in 7-10 business days. Colleena Kurtenbach l Case Vassell, CMA

## 2019-11-26 ENCOUNTER — Other Ambulatory Visit: Payer: Self-pay | Admitting: Family Medicine

## 2019-11-26 DIAGNOSIS — I10 Essential (primary) hypertension: Secondary | ICD-10-CM

## 2019-11-26 DIAGNOSIS — M79672 Pain in left foot: Secondary | ICD-10-CM

## 2019-11-26 DIAGNOSIS — M79671 Pain in right foot: Secondary | ICD-10-CM

## 2019-11-29 ENCOUNTER — Other Ambulatory Visit: Payer: Self-pay | Admitting: Internal Medicine

## 2019-12-03 ENCOUNTER — Other Ambulatory Visit (HOSPITAL_BASED_OUTPATIENT_CLINIC_OR_DEPARTMENT_OTHER): Payer: Self-pay | Admitting: Internal Medicine

## 2019-12-03 ENCOUNTER — Ambulatory Visit: Payer: 59 | Attending: Internal Medicine

## 2019-12-03 DIAGNOSIS — Z23 Encounter for immunization: Secondary | ICD-10-CM

## 2019-12-06 NOTE — Progress Notes (Signed)
   Covid-19 Vaccination Clinic  Name:  Sonya [name]    MRN: [id number] DOB: 10/03/1960  12/06/2019  [name] was observed post Covid-19 immunization for 15 minutes without incident. She was provided with Vaccine Information Sheet and instruction to access the V-Safe system.   [name] was instructed to call 911 with any severe reactions post vaccine: Marland Kitchen Difficulty breathing  . Swelling of face and throat  . A fast heartbeat  . A bad rash all over body  . Dizziness and weakness

## 2019-12-10 MED FILL — PFIZER-BIONTECH COVID-19 VA: 30 | 1 days supply | Qty: 0 | Fill #0

## 2019-12-22 ENCOUNTER — Other Ambulatory Visit: Payer: Self-pay | Admitting: Internal Medicine

## 2019-12-24 NOTE — Telephone Encounter (Signed)
Attempted PA on the phone with [facility] - patient is inactive with this insurance carrier. Spoke with patient who states she now has [employer]. Asked that she reply back to my MyChart message with her new insurance information

## 2019-12-27 ENCOUNTER — Other Ambulatory Visit: Payer: Self-pay | Admitting: Family Medicine

## 2019-12-27 DIAGNOSIS — G4700 Insomnia, unspecified: Secondary | ICD-10-CM

## 2019-12-27 DIAGNOSIS — F418 Other specified anxiety disorders: Secondary | ICD-10-CM

## 2019-12-27 NOTE — Telephone Encounter (Signed)
Requesting: lorazepam 0.5mg  Contract: 10/04/2019 UDS: 10/05/2019  Last Visit: 10/04/2019 Next Visit: 04/04/2020  Last Refill: 10/27/2019 #30 and 1RF Pt sig: 1 tab bid prn  Please Advise

## 2020-01-17 ENCOUNTER — Telehealth: Payer: Self-pay | Admitting: Internal Medicine

## 2020-01-17 NOTE — Telephone Encounter (Signed)
PA for Praluent 150mg /mL submitted via CMM  Medication approved from 01/16/2021  Left message for patient with this info & MyChart message sent

## 2020-01-24 NOTE — Telephone Encounter (Signed)
Spoke with patient about PCSK9. Explained that Sonya [name] prefers Praluent instead of Repatha. She is getting Repatha at $5/month and Praluent is "as little as $25/month" with copay card. She prefers to stay on current med  Advised can try to get approval for Repatha

## 2020-01-25 NOTE — Telephone Encounter (Signed)
PA for Repatha Sureclick submitted via [place] (Key: [id number])

## 2020-01-26 ENCOUNTER — Other Ambulatory Visit: Payer: Self-pay | Admitting: Family Medicine

## 2020-01-26 DIAGNOSIS — M19041 Primary osteoarthritis, right hand: Secondary | ICD-10-CM

## 2020-01-26 DIAGNOSIS — M79672 Pain in left foot: Secondary | ICD-10-CM

## 2020-02-02 ENCOUNTER — Ambulatory Visit: Payer: 59 | Admitting: Podiatry

## 2020-02-02 ENCOUNTER — Other Ambulatory Visit: Payer: Self-pay

## 2020-02-02 DIAGNOSIS — M778 Other enthesopathies, not elsewhere classified: Secondary | ICD-10-CM

## 2020-02-02 MED ORDER — PRALUENT 150 MG/ML ~~LOC~~ SOAJ: 1.0000 | SUBCUTANEOUS | 3 refills | Status: DC

## 2020-02-02 MED ORDER — TRIAMCINOLONE ACETONIDE 10 MG/ML IJ SUSP
10.0000 mg | Freq: Once | INTRAMUSCULAR | Status: AC
  Administered 2020-02-02: 10 mg

## 2020-02-02 NOTE — Progress Notes (Signed)
Subjective:   Patient ID: Sonya [name], female   DOB: 59 y.o.   MRN: [id number]   HPI Patient states the top of both feet has started to get sore again and its not as bad but she wanted it treated before the end of the year   ROS      Objective:  Physical Exam  Neurovascular status intact with discomfort in the dorsal aspect of the left extensor complex bilateral     Assessment:  Extensor tendinitis with acute inflammation bilateral     Plan:  H&P sterile prep done injected the extensor complex bilateral 3 mg Dexasone Kenalog 5 mg Xylocaine advised on topical reappoint to recheck

## 2020-02-02 NOTE — Telephone Encounter (Signed)
We received and reviewed your request for authorization of coverage for Repatha SureClick 140MG /ML [place] SOAJ. We've denied the request for the following reason(s): The preferred product for the patient's health plan is Praluent. Current plan approved criteria does not allow coverage of Repatha unless the patient has had a bad side effect with Praluent and the prescriber does not expect the same side effect to occur with Repatha. Supporting chart note(s) must be submitted. Additional coverage criteria may apply, please review policy or plan documents for full requirements.

## 2020-02-02 NOTE — Telephone Encounter (Signed)
Sent patient a My Chart message with this info

## 2020-02-02 NOTE — Addendum Note (Signed)
Addended by: Lindell Spar on: 02/02/2020 12:26 PM   Modules accepted: Orders

## 2020-02-02 NOTE — Telephone Encounter (Signed)
Patient responded via MyChart message Rx(s) sent to pharmacy electronically.

## 2020-02-23 NOTE — Telephone Encounter (Signed)
Sadly we are out of Praluent samples and the company is not sending samples anywhere for another 1-2 weeks.  I requested samples in December, and was told they plan to send new request mid-January to end-January

## 2020-02-23 NOTE — Telephone Encounter (Signed)
Message sent to patient that we are out of samples

## 2020-02-24 ENCOUNTER — Other Ambulatory Visit: Payer: Self-pay | Admitting: Family Medicine

## 2020-02-24 DIAGNOSIS — F418 Other specified anxiety disorders: Secondary | ICD-10-CM

## 2020-02-24 DIAGNOSIS — G4700 Insomnia, unspecified: Secondary | ICD-10-CM

## 2020-02-24 DIAGNOSIS — Z1231 Encounter for screening mammogram for malignant neoplasm of breast: Secondary | ICD-10-CM

## 2020-02-25 NOTE — Telephone Encounter (Signed)
Requesting: lorazepam Contract: will get at next visit UDS: 10/04/19 Last Visit: 10/04/19 Next Visit: 04/04/20 Last Refill: 12/27/19  Please Advise

## 2020-03-13 ENCOUNTER — Telehealth: Payer: Self-pay

## 2020-03-13 NOTE — Telephone Encounter (Signed)
Called [employer] and spoke with Sharyn Creamer states the prescriber section of the Patient assistance application was received on 03/10/20. Sonya [name] states that Sonya [name] will need to fax the patient section and proof of income.   Called pt and pt was made aware that she will need to finish her portion of the application and fax proof of income to [place]. Understanding was voiced. Sonya [name], CMA

## 2020-03-28 ENCOUNTER — Other Ambulatory Visit: Payer: Self-pay | Admitting: Family Medicine

## 2020-03-28 ENCOUNTER — Telehealth: Payer: Self-pay

## 2020-03-28 DIAGNOSIS — M79671 Pain in right foot: Secondary | ICD-10-CM

## 2020-03-28 DIAGNOSIS — M79672 Pain in left foot: Secondary | ICD-10-CM

## 2020-03-28 NOTE — Telephone Encounter (Signed)
Per chart review, Rx was sent to [facility] on 02/02/20 Sent patient a MyChart message asking her to confirm pharmacy

## 2020-03-28 NOTE — Telephone Encounter (Signed)
Pt stated never got praluent I gave her a sample. Will route to [place] e

## 2020-04-04 ENCOUNTER — Ambulatory Visit: Payer: 59 | Admitting: Family Medicine

## 2020-04-06 ENCOUNTER — Other Ambulatory Visit: Payer: Self-pay

## 2020-04-06 ENCOUNTER — Ambulatory Visit
Admission: RE | Admit: 2020-04-06 | Discharge: 2020-04-06 | Disposition: A | Discharge status: Home / Self Care | Payer: No Typology Code available for payment source | Source: Ambulatory Visit | Attending: Family Medicine | Admitting: Family Medicine

## 2020-04-06 DIAGNOSIS — Z1231 Encounter for screening mammogram for malignant neoplasm of breast: Secondary | ICD-10-CM

## 2020-04-15 ENCOUNTER — Other Ambulatory Visit: Payer: Self-pay | Admitting: Internal Medicine

## 2020-04-25 ENCOUNTER — Other Ambulatory Visit: Payer: Self-pay | Admitting: Family Medicine

## 2020-04-25 DIAGNOSIS — F418 Other specified anxiety disorders: Secondary | ICD-10-CM

## 2020-04-25 DIAGNOSIS — G4700 Insomnia, unspecified: Secondary | ICD-10-CM

## 2020-04-26 NOTE — Telephone Encounter (Signed)
Requesting: Ativan Contract: 10/04/2019 UDS: 10/04/2019 Last OV: 10/04/2019 Next OV: 10/10/2020 Last Refill: 02/25/2020, #30--1 RF Database:   Please advise

## 2020-05-22 ENCOUNTER — Other Ambulatory Visit: Payer: Self-pay | Admitting: Family Medicine

## 2020-05-22 DIAGNOSIS — I10 Essential (primary) hypertension: Secondary | ICD-10-CM

## 2020-05-29 ENCOUNTER — Other Ambulatory Visit: Payer: Self-pay | Admitting: Family Medicine

## 2020-05-29 DIAGNOSIS — M79671 Pain in right foot: Secondary | ICD-10-CM

## 2020-05-29 DIAGNOSIS — M79672 Pain in left foot: Secondary | ICD-10-CM

## 2020-05-29 MED ORDER — REPATHA SURECLICK 140 MG/ML ~~LOC~~ SOAJ: 1.0000 | SUBCUTANEOUS | 1 refills | Status: DC

## 2020-05-29 NOTE — Addendum Note (Signed)
Addended by: Fidel Levy on: 05/29/2020 03:07 PM   Modules accepted: Orders

## 2020-05-30 ENCOUNTER — Telehealth: Payer: Self-pay | Admitting: Internal Medicine

## 2020-05-30 NOTE — Telephone Encounter (Signed)
[facility] - [facility] cannot be filled at local pharmacy  Was provided phone number for [facility] [phone]

## 2020-05-30 NOTE — Telephone Encounter (Signed)
Patient reports side effects on Praluent. Previously on Repatha (tolerated) and LDL decreased from 240+ to 83 (09/2019)  PA submitted for Repatha Sureclick via [place] (Key: [id number]) - [id number]

## 2020-06-02 ENCOUNTER — Ambulatory Visit: Payer: No Typology Code available for payment source | Admitting: Podiatry

## 2020-06-07 ENCOUNTER — Other Ambulatory Visit: Payer: Self-pay | Admitting: Family Medicine

## 2020-06-07 DIAGNOSIS — M19041 Primary osteoarthritis, right hand: Secondary | ICD-10-CM

## 2020-06-08 NOTE — Telephone Encounter (Signed)
Holland Falling is requesting additional clinical information and fax for request was received while this nurse was out of office. Called [employer] who states PA request is denied but can do an urgent appeal and fax to [phone]   Faxed original request form, note in chart stating patient has SE on Praluent, last lipid panel, last MD note  PA # [phone]

## 2020-06-13 NOTE — Telephone Encounter (Signed)
Received fax from [place] with copy of appeal info submitted Subject reads "[place] Fax"  Call placed to [place] [phone]) Was informed by another rep that patient's specialty med appeals is handled by Holland Falling This fax is [phone]  Info faxed

## 2020-06-19 NOTE — Telephone Encounter (Signed)
Re-faxed paperwork for appeal as original fax did not go through

## 2020-06-22 NOTE — Telephone Encounter (Signed)
Fax failed again - call made to [facility] and new fax # given  Paperwork for appeal faxed to [phone]

## 2020-06-28 ENCOUNTER — Other Ambulatory Visit: Payer: Self-pay | Admitting: Family Medicine

## 2020-06-28 ENCOUNTER — Other Ambulatory Visit: Payer: Self-pay | Admitting: Internal Medicine

## 2020-06-28 DIAGNOSIS — G4700 Insomnia, unspecified: Secondary | ICD-10-CM

## 2020-06-28 DIAGNOSIS — F418 Other specified anxiety disorders: Secondary | ICD-10-CM

## 2020-06-29 ENCOUNTER — Other Ambulatory Visit: Payer: Self-pay | Admitting: Internal Medicine

## 2020-06-29 NOTE — Telephone Encounter (Signed)
PA SUBMITTED and awaiting questions FOR REPATHA Sonya [name] (Key: [id number]) WILL ROUTE TO JENNA E AS THIS IS A HILTY PT

## 2020-06-29 NOTE — Telephone Encounter (Signed)
Requesting: lorazepam 0.5mg  Contract: 10/04/2019 UDS: 10/05/2019 Last Visit: 10/04/2019 Next Visit: 10/10/2020 Last Refill: 04/27/2020 #30 and 1RF  Please Advise

## 2020-06-30 ENCOUNTER — Telehealth: Payer: Self-pay

## 2020-06-30 NOTE — Telephone Encounter (Signed)
Haleigh CMA submitted PA for Repatha via another, new request Medication approved  On behalf of the [employer], [employer] as their utilization review entity, received a request for coverage or an exception to the coverage requirements of Repatha for you. This request is approved for the following time period: 06/29/2020 - 06/29/2021

## 2020-06-30 NOTE — Telephone Encounter (Signed)
Pt called requesting a refill of Prempro through the [place] Patient Assistance Program. Per the automated service, the medication will be released for refill on 07/01/20 and it will ship 7-10 business days from the release date. The ID number for this order is [id number]. Pt's next refill is due in August 2022.  Janique Hoefer l Jamela Cumbo, CMA

## 2020-07-05 ENCOUNTER — Ambulatory Visit: Payer: No Typology Code available for payment source | Admitting: Podiatry

## 2020-07-14 ENCOUNTER — Other Ambulatory Visit: Payer: Self-pay | Admitting: Internal Medicine

## 2020-07-27 ENCOUNTER — Other Ambulatory Visit: Payer: Self-pay | Admitting: Family Medicine

## 2020-07-27 DIAGNOSIS — M79672 Pain in left foot: Secondary | ICD-10-CM

## 2020-08-18 ENCOUNTER — Telehealth: Payer: Self-pay | Admitting: Internal Medicine

## 2020-08-18 NOTE — Telephone Encounter (Signed)
Called patient, advised of message from PharmD.  Patient verbalized understanding.  Thankful for call back, would call with update.

## 2020-08-18 NOTE — Telephone Encounter (Signed)
Neither is known to cause muscle cramping.  I would suggest that she stop the ezetimibe for 2 weeks.  If the cramping continues, restart that and hold next dose of Repatha.

## 2020-08-18 NOTE — Telephone Encounter (Signed)
Pt is calling to discuss the [employer]

## 2020-08-18 NOTE — Telephone Encounter (Signed)
Called patient, she states that she has starting having the issues with leg pain again. She stated she tried another medication but had to switch to the Repatha- she has only had about 1-2 doses of the Repatha, last recent was about a week ago. She states that she has the leg cramping and pain at night time, to wear she can not sleep and she is always moving in bed and can not get comfortable. She states that she would like to know if the Zetia could cause this, or the Repatha? I advised I could send to PharmD to advise and call her back and let her know of any recommendations.   Patient verbalized understanding.

## 2020-08-19 ENCOUNTER — Other Ambulatory Visit: Payer: Self-pay | Admitting: Family Medicine

## 2020-08-19 DIAGNOSIS — M19041 Primary osteoarthritis, right hand: Secondary | ICD-10-CM

## 2020-08-25 ENCOUNTER — Other Ambulatory Visit: Payer: Self-pay | Admitting: Family Medicine

## 2020-08-25 DIAGNOSIS — F418 Other specified anxiety disorders: Secondary | ICD-10-CM

## 2020-08-25 DIAGNOSIS — G4700 Insomnia, unspecified: Secondary | ICD-10-CM

## 2020-08-25 NOTE — Telephone Encounter (Signed)
Requesting: lorazepam 0.'5mg'$  Contract: 10/04/2019 UDS: 10/05/2019 Last Visit: 10/04/2019 Next Visit: None Last Refill: 06/29/2020 #30 and 1RF  Please Advise

## 2020-09-13 ENCOUNTER — Ambulatory Visit: Payer: No Typology Code available for payment source | Admitting: Podiatry

## 2020-09-25 ENCOUNTER — Other Ambulatory Visit: Payer: Self-pay | Admitting: Family Medicine

## 2020-09-25 DIAGNOSIS — M79672 Pain in left foot: Secondary | ICD-10-CM

## 2020-09-25 DIAGNOSIS — M79671 Pain in right foot: Secondary | ICD-10-CM

## 2020-10-10 ENCOUNTER — Encounter: Payer: 59 | Admitting: Family Medicine

## 2020-10-11 ENCOUNTER — Telehealth: Payer: Self-pay

## 2020-10-11 NOTE — Telephone Encounter (Signed)
Pt called requesting a refill of Prempro. A refill order will be placed through the [employer] patient assistance program.Understanding was voiced. Khaliyah [employer]rop l Raylie Maddison, CMA

## 2020-10-11 NOTE — Telephone Encounter (Signed)
Prempro refill was placed through [place] Patient assistance program. The medication is expected to arrive in 7-10 business days.   Order ID for this shipment is [id number]. Next refill is due on Nov 14th.  Juana Montini l Hamdi Kley, CMA

## 2020-10-26 ENCOUNTER — Other Ambulatory Visit: Payer: Self-pay | Admitting: Family Medicine

## 2020-10-26 DIAGNOSIS — G4700 Insomnia, unspecified: Secondary | ICD-10-CM

## 2020-10-26 DIAGNOSIS — F418 Other specified anxiety disorders: Secondary | ICD-10-CM

## 2020-10-27 ENCOUNTER — Encounter: Payer: Self-pay | Admitting: Family Medicine

## 2020-10-27 ENCOUNTER — Other Ambulatory Visit: Payer: Self-pay

## 2020-10-27 ENCOUNTER — Ambulatory Visit (INDEPENDENT_AMBULATORY_CARE_PROVIDER_SITE_OTHER): Payer: No Typology Code available for payment source | Admitting: Family Medicine

## 2020-10-27 VITALS — BP 116/82 | HR 69 | Temp 98.0°F | Resp 18 | Wt 155.6 lb

## 2020-10-27 DIAGNOSIS — M8949 Other hypertrophic osteoarthropathy, multiple sites: Secondary | ICD-10-CM

## 2020-10-27 DIAGNOSIS — M159 Polyosteoarthritis, unspecified: Secondary | ICD-10-CM

## 2020-10-27 DIAGNOSIS — Z0000 Encounter for general adult medical examination without abnormal findings: Secondary | ICD-10-CM | POA: Diagnosis not present

## 2020-10-27 DIAGNOSIS — Z1211 Encounter for screening for malignant neoplasm of colon: Secondary | ICD-10-CM | POA: Diagnosis not present

## 2020-10-27 DIAGNOSIS — M791 Myalgia, unspecified site: Secondary | ICD-10-CM

## 2020-10-27 DIAGNOSIS — E7849 Other hyperlipidemia: Secondary | ICD-10-CM

## 2020-10-27 DIAGNOSIS — Z23 Encounter for immunization: Secondary | ICD-10-CM | POA: Diagnosis not present

## 2020-10-27 DIAGNOSIS — I10 Essential (primary) hypertension: Secondary | ICD-10-CM

## 2020-10-27 MED ORDER — TRAMADOL HCL 50 MG PO TABS
50.0000 mg | ORAL_TABLET | Freq: Three times a day (TID) | ORAL | 0 refills | Status: DC | PRN
Start: 2020-10-27 — End: 2020-11-09

## 2020-10-27 NOTE — Assessment & Plan Note (Signed)
ghm utd Check labs  See avs  

## 2020-10-27 NOTE — Assessment & Plan Note (Signed)
Per cardiology Pt is on repatha  Check labs

## 2020-10-27 NOTE — Patient Instructions (Signed)
Preventive Care 40-60 Years Old, Female Preventive care refers to lifestyle choices and visits with your health care provider that can promote health and wellness. This includes: A yearly physical exam. This is also called an annual wellness visit. Regular dental and eye exams. Immunizations. Screening for certain conditions. Healthy lifestyle choices, such as: Eating a healthy diet. Getting regular exercise. Not using drugs or products that contain nicotine and tobacco. Limiting alcohol use. What can I expect for my preventive care visit? Physical exam Your health care provider will check your: Height and weight. These may be used to calculate your BMI (body mass index). BMI is a measurement that tells if you are at a healthy weight. Heart rate and blood pressure. Body temperature. Skin for abnormal spots. Counseling Your health care provider may ask you questions about your: Past medical problems. Family's medical history. Alcohol, tobacco, and drug use. Emotional well-being. Home life and relationship well-being. Sexual activity. Diet, exercise, and sleep habits. Work and work environment. Access to firearms. Method of birth control. Menstrual cycle. Pregnancy history. What immunizations do I need? Vaccines are usually given at various ages, according to a schedule. Your health care provider will recommend vaccines for you based on your age, medical history, and lifestyle or other factors, such as travel or where you work. What tests do I need? Blood tests Lipid and cholesterol levels. These may be checked every 5 years, or more often if you are over 50 years old. Hepatitis C test. Hepatitis B test. Screening Lung cancer screening. You may have this screening every year starting at age 55 if you have a 30-pack-year history of smoking and currently smoke or have quit within the past 15 years. Colorectal cancer screening. All adults should have this screening starting at  age 50 and continuing until age 75. Your health care provider may recommend screening at age 45 if you are at increased risk. You will have tests every 1-10 years, depending on your results and the type of screening test. Diabetes screening. This is done by checking your blood sugar (glucose) after you have not eaten for a while (fasting). You may have this done every 1-3 years. Mammogram. This may be done every 1-2 years. Talk with your health care provider about when you should start having regular mammograms. This may depend on whether you have a family history of breast cancer. BRCA-related cancer screening. This may be done if you have a family history of breast, ovarian, tubal, or peritoneal cancers. Pelvic exam and Pap test. This may be done every 3 years starting at age 21. Starting at age 30, this may be done every 5 years if you have a Pap test in combination with an HPV test. Other tests STD (sexually transmitted disease) testing, if you are at risk. Bone density scan. This is done to screen for osteoporosis. You may have this scan if you are at high risk for osteoporosis. Talk with your health care provider about your test results, treatment options, and if necessary, the need for more tests. Follow these instructions at home: Eating and drinking  Eat a diet that includes fresh fruits and vegetables, whole grains, lean protein, and low-fat dairy products. Take vitamin and mineral supplements as recommended by your health care provider. Do not drink alcohol if: Your health care provider tells you not to drink. You are pregnant, may be pregnant, or are planning to become pregnant. If you drink alcohol: Limit how much you have to 0-1 drink a day. Be   aware of how much alcohol is in your drink. In the [place] one drink equals one 12 oz bottle of beer (355 mL), one 5 oz glass of wine (148 mL), or one 1 oz glass of hard liquor (44 mL). Lifestyle Take daily care of your teeth and  gums. Brush your teeth every morning and night with fluoride toothpaste. Floss one time each day. Stay active. Exercise for at least 30 minutes 5 or more days each week. Do not use any products that contain nicotine or tobacco, such as cigarettes, e-cigarettes, and chewing tobacco. If you need help quitting, ask your health care provider. Do not use drugs. If you are sexually active, practice safe sex. Use a condom or other form of protection to prevent STIs (sexually transmitted infections). If you do not wish to become pregnant, use a form of birth control. If you plan to become pregnant, see your health care provider for a prepregnancy visit. If told by your health care provider, take low-dose aspirin daily starting at age 63. Find healthy ways to cope with stress, such as: Meditation, yoga, or listening to music. Journaling. Talking to a trusted person. Spending time with friends and family. Safety Always wear your seat belt while driving or riding in a vehicle. Do not drive: If you have been drinking alcohol. Do not ride with someone who has been drinking. When you are tired or distracted. While texting. Wear a helmet and other protective equipment during sports activities. If you have firearms in your house, make sure you follow all gun safety procedures. What's next? Visit your health care provider once a year for an annual wellness visit. Ask your health care provider how often you should have your eyes and teeth checked. Stay up to date on all vaccines. This information is not intended to replace advice given to you by your health care provider. Make sure you discuss any questions you have with your health care provider. Document Revised: 03/31/2020 Document Reviewed: 10/02/2017 Elsevier Patient Education  2022 [employer].

## 2020-10-27 NOTE — Progress Notes (Addendum)
Subjective:   By signing my name below, I, Zite Okoli, attest that this documentation has been prepared under the direction and in the presence of [name], DO. 10/27/2020     Patient ID: Sonya [name], female    DOB: 1960-07-20, 60 y.o.   MRN: [id number]  Chief Complaint  Patient presents with   Annual Exam    Pt states fasting     HPI Patient is in today for a comprehensive physical exam.  She reports she still has cramps in her legs and feet especially when she sits or lies down. She also has hip pain and has been diagnosed with arthritis. She mentions that sometimes the foot pain is bad and she is unable to walk and has also not been able to sleep at night because of the pain. She reports that ibuprofen or tylenol do not help to relieve the pain.  She has stopped taking 10 mg Zetia.  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  She also reports she has been having sinus pressure and been feeling dizzy for the past 2 weeks. She went to the ER and mentions the symptoms have lessened.  She denies fever, hearing loss, ear pain,congestion, sinus pain, sore throat, eye pain, chest pain, palpitations, cough, shortness of breath, wheezing, nausea. vomiting, diarrhea, constipation, blood in stool, dysuria,frequency, hematuria and headaches.   She is UTD on vision and dental care.  She tries to exercise regularly but the pain in her feet limit her movements.   She is interested in getting the flu vaccine today. She has 3 Pfizer Covid-19 vaccines.  Her mother died of cancer in 04-Nov-2019 at 5 and her father died of dementia in Jul 03, 2020 at 25.  Past Medical History:  Diagnosis Date   Arthritis    Fibromyalgia    Hypertension     Past Surgical History:  Procedure Laterality Date   CESAREAN SECTION  1987    Family History  Problem Relation Age of Onset   Arthritis Mother    Hyperlipidemia  Mother    Hypertension Mother    Diabetes Mother    Cancer - Other Mother        vulvar   Aortic stenosis Mother    Lung cancer Mother    Cancer - Lung Mother    Arthritis Father    Hyperlipidemia Father    Heart failure Father        Hx disease   Hypertension Father    Stroke Father    Prostate cancer Father    Macular degeneration Father    Alzheimer's disease Father    Dementia Father    Alcohol abuse Sister    Subarachnoid hemorrhage Sister    Heart attack Brother    Stomach cancer Maternal Grandmother    Stroke Paternal Grandfather    Lung cancer Paternal Grandfather        smoker   Healthy Son    Healthy Son     Social History   Socioeconomic History   Marital status: Married    Spouse name: Not on file   Number of children: Not on file   Years of education: Not on file   Highest education level: Not on file  Occupational History   Occupation: Emergency planning/management officer  Tobacco Use   Smoking status: Never   Smokeless tobacco: Never  Vaping Use   Vaping Use: Never used  Substance and Sexual Activity   Alcohol use: No   Drug use: No   Sexual activity: Not Currently    Birth control/protection: None  Other Topics Concern   Not on file  Social History Narrative   Not on file   Social Determinants of Health   Financial Resource Strain: Not on file  Food Insecurity: Not on file  Transportation Needs: Not on file  Physical Activity: Not on file  Stress: Not on file  Social Connections: Not on file  Intimate Partner Violence: Not on file    Outpatient Medications Prior to Visit  Medication Sig Dispense Refill   ALPRAZolam (XANAX) 0.5 MG tablet Take 1 tablet (0.5 mg total) by mouth 3 (three) times daily as needed. 30 tablet 1   azelastine (ASTELIN) 0.1 % nasal spray Place 2 sprays into both nostrils 2 (two) times daily. Use in each nostril as directed 30 mL 3   Biotin 1000 MCG tablet Take 1,000 mcg by mouth 3 (three) times daily.     celecoxib (CELEBREX) 200 MG  capsule TAKE ONE CAPSULE BY MOUTH TWICE DAILY AS NEEDED MODERATE PAIN 60 capsule 0   [employer] OMEGA-3 KRILL OIL 500 MG CAPS Take 1 capsule by mouth daily.     diclofenac sodium (VOLTAREN) 1 % GEL APPLY 1 APPLICATION TOPICALLY 4 (FOUR) TIMES DAILY AS NEEDED.*NOT COVERED* 100 g 2   estradiol (ESTRACE) 0.5 MG tablet Take 1 tablet (0.5 mg total) by mouth daily. 30 tablet 3   estrogen, conjugated,-medroxyprogesterone (PREMPRO) 0.3-1.5 MG tablet Take 1 tablet by mouth daily.     ezetimibe (ZETIA) 10 MG tablet TAKE ONE TABLET BY MOUTH ONE TIME DAILY 90 tablet 0   hydrochlorothiazide (HYDRODIURIL) 25 MG tablet Take 1 tablet (25 mg total) by mouth daily. 90 tablet 1   LORazepam (ATIVAN) 0.5 MG tablet TAKE 1 TABLET BY MOUTH TWICE A DAY AS NEEDED FOR ANXIETY 30 tablet 1   meclizine (ANTIVERT) 25 MG tablet Take 1 tablet (25 mg total) by mouth 3 (three) times daily as needed for dizziness. 30 tablet 0   methocarbamol (ROBAXIN) 500 MG tablet TAKE 1 TABLET BY MOUTH FOUR TIMES A DAY 30 tablet 1   Multiple Vitamin (MULTIVITAMIN) tablet Take 1 tablet by mouth daily.     Multiple Vitamins-Minerals (EQ VISION FORMULA 50+) CAPS Take by mouth.     NONFORMULARY OR COMPOUNDED ITEM cbd oil  Full spectrum 1 each 0   REPATHA SURECLICK 630 MG/ML SOAJ INJECT 1 DOSE INTO THE SKIN EVERY 14 (FOURTEEN) DAYS. 2 mL 11   Ascorbic Acid (VITAMIN C) 1000 MG tablet Take 1,000 mg by mouth daily. (Patient not taking: Reported on 10/27/2020)     aspirin 325 MG tablet Take 325 mg by mouth daily. (Patient not taking: Reported on 10/27/2020)     aspirin EC 81 MG tablet Take 81 mg by mouth daily. (Patient not taking: Reported on 10/27/2020)     COVID-19 mRNA vaccine, Pfizer, 30 MCG/0.3ML injection INJECT AS DIRECTED (Patient not taking: Reported on 10/27/2020) .3 mL 0   Turmeric, Curcuma Longa, (CURCUMIN EXTRACT) POWD Take 48 mg by mouth daily. (Patient not taking: Reported on 10/27/2020)     No facility-administered medications prior to visit.     Allergies  Allergen Reactions   Atorvastatin Other (See Comments)    Leg cramps   Livalo [Pitavastatin] Other (See Comments)    Flu like symptoms - 2 mg   Praluent [Alirocumab]     Joint pain   Pravastatin  Other (See Comments)    40 mg -myalgias    Review of Systems  Constitutional:  Negative for fever.  HENT:  Negative for congestion, ear pain, hearing loss, sinus pain and sore throat.   Eyes:  Negative for blurred vision and pain.  Respiratory:  Negative for cough, sputum production, shortness of breath and wheezing.   Cardiovascular:  Negative for chest pain and palpitations.  Gastrointestinal:  Negative for blood in stool, constipation, diarrhea, nausea and vomiting.  Genitourinary:  Negative for dysuria, frequency, hematuria and urgency.  Musculoskeletal:  Positive for joint pain (hip) and myalgias. Negative for back pain and falls.       (+) thigh cramps (+) Feet pain  Neurological:  Negative for dizziness, sensory change, loss of consciousness, weakness and headaches.  Endo/Heme/Allergies:  Negative for environmental allergies. Does not bruise/bleed easily.  Psychiatric/Behavioral:  Negative for depression and suicidal ideas. The patient is not nervous/anxious and does not have insomnia.       Objective:    Physical Exam Constitutional:      General: She is not in acute distress.    Appearance: Normal appearance. She is not ill-appearing.  HENT:     Head: Normocephalic and atraumatic.     Right Ear: External ear normal.     Left Ear: External ear normal.  Eyes:     Extraocular Movements: Extraocular movements intact.     Pupils: Pupils are equal, round, and reactive to light.  Cardiovascular:     Rate and Rhythm: Normal rate and regular rhythm.     Pulses: Normal pulses.     Heart sounds: Normal heart sounds. No murmur heard.   No gallop.  Pulmonary:     Effort: Pulmonary effort is normal. No respiratory distress.     Breath sounds: Normal breath sounds.  No wheezing, rhonchi or rales.  Abdominal:     General: Bowel sounds are normal. There is no distension.     Palpations: Abdomen is soft. There is no mass.     Tenderness: There is no abdominal tenderness. There is no guarding or rebound.     Hernia: No hernia is present.  Musculoskeletal:     Cervical back: Normal range of motion and neck supple.     Right upper leg: Tenderness present.     Left upper leg: Tenderness present.     Right knee: Tenderness present.     Left knee: Tenderness present.     Right lower leg: Tenderness present.     Left lower leg: Tenderness present.     Right foot: Tenderness present.     Left foot: Tenderness present.  Lymphadenopathy:     Cervical: No cervical adenopathy.  Skin:    General: Skin is warm and dry.  Neurological:     Mental Status: She is alert and oriented to person, place, and time.  Psychiatric:        Behavior: Behavior normal.    BP 116/82 (BP Location: Left Arm, Patient Position: Sitting, Cuff Size: Normal)   Pulse 69   Temp 98 F (36.7 C) (Oral)   Resp 18   Wt 155 lb 9.6 oz (70.6 kg)   LMP 02/23/2016   SpO2 97%   BMI 28.01 kg/m  Wt Readings from Last 3 Encounters:  10/27/20 155 lb 9.6 oz (70.6 kg)  10/04/19 156 lb 12.8 oz (71.1 kg)  05/13/19 162 lb (73.5 kg)    Diabetic Foot Exam - Simple   No data filed  Lab Results  Component Value Date   WBC 5.3 10/04/2019   HGB 14.1 10/04/2019   HCT 42.1 10/04/2019   PLT 187 10/04/2019   GLUCOSE 95 10/04/2019   CHOL 168 10/04/2019   TRIG 76 10/04/2019   HDL 68 10/04/2019   LDLCALC 83 10/04/2019   ALT 29 10/04/2019   AST 22 10/04/2019   NA 139 10/04/2019   K 3.6 10/04/2019   CL 102 10/04/2019   CREATININE 0.69 10/04/2019   BUN 12 10/04/2019   CO2 29 10/04/2019   TSH 1.63 10/04/2019    Lab Results  Component Value Date   TSH 1.63 10/04/2019   Lab Results  Component Value Date   WBC 5.3 10/04/2019   HGB 14.1 10/04/2019   HCT 42.1 10/04/2019   MCV 90.0  10/04/2019   PLT 187 10/04/2019   Lab Results  Component Value Date   NA 139 10/04/2019   K 3.6 10/04/2019   CO2 29 10/04/2019   GLUCOSE 95 10/04/2019   BUN 12 10/04/2019   CREATININE 0.69 10/04/2019   BILITOT 0.8 10/04/2019   ALKPHOS 68 09/28/2018   AST 22 10/04/2019   ALT 29 10/04/2019   PROT 6.0 (L) 10/04/2019   ALBUMIN 4.5 09/28/2018   CALCIUM 9.1 10/04/2019   GFR 87.48 09/28/2018   Lab Results  Component Value Date   CHOL 168 10/04/2019   Lab Results  Component Value Date   HDL 68 10/04/2019   Lab Results  Component Value Date   LDLCALC 83 10/04/2019   Lab Results  Component Value Date   TRIG 76 10/04/2019   Lab Results  Component Value Date   CHOLHDL 2.5 10/04/2019   No results found for: HGBA1C      Mammogram: Last completed on 04/06/2020. Results were normal. Repeat in 1 year. Pap Smear: Last completed on 10/28/2018. Results were normal. Polyp was found and excised. Repeat in 3 years. Assessment & Plan:   Problem List Items Addressed This Visit       Unprioritized   Essential hypertension    .Well controlled, no changes to meds. Encouraged heart healthy diet such as the DASH diet and exercise as tolerated.       Familial hyperlipidemia    Per cardiology Pt is on repatha  Check labs       Relevant Orders   Comprehensive metabolic panel   CBC with Differential/Platelet   Lipid panel   TSH   Preventative health care - Primary    ghm utd Check labs  See avs      Relevant Orders   Comprehensive metabolic panel   CBC with Differential/Platelet   Lipid panel   TSH   Other Visit Diagnoses     Need for influenza vaccination       Relevant Orders   Flu Vaccine QUAD 4mo+IM (Fluarix, Fluzone & Alfiuria Quad PF) (Completed)   Primary osteoarthritis involving multiple joints       Relevant Medications   traMADol (ULTRAM) 50 MG tablet   Colon cancer screening       Relevant Orders   Ambulatory referral to Gastroenterology   Myalgia        Relevant Orders   CK   VITAMIN D 25 Hydroxy (Vit-D Deficiency, Fractures)        Meds ordered this encounter  Medications   traMADol (ULTRAM) 50 MG tablet    Sig: Take 1 tablet (50 mg total) by mouth every 8 (eight) hours as needed for up to  5 days.    Dispense:  15 tablet    Refill:  0    I,Zite Okoli,acting as a scribe for [employer], DO.,have documented all relevant documentation on the behalf of [name], DO,as directed by  [name], DO while in the presence of [name], [place], DO., personally preformed the services described in this documentation.  All medical record entries made by the scribe were at my direction and in my presence.  I have reviewed the chart and discharge instructions (if applicable) and agree that the record reflects my personal performance and is accurate and complete. 10/27/2020

## 2020-10-27 NOTE — Telephone Encounter (Signed)
Requesting: lorazepam 0.5mg  Contract: 10/04/2019 UDS: 10/05/2019 Last Visit: 10/04/2019 Next Visit: None Last Refill: 08/25/2020 #30 and 1RF  Please Advise

## 2020-10-27 NOTE — Assessment & Plan Note (Signed)
Well controlled, no changes to meds. Encouraged heart healthy diet such as the DASH diet and exercise as tolerated.  °

## 2020-10-28 LAB — COMPREHENSIVE METABOLIC PANEL
AG Ratio: 2.1 (calc) (ref 1.0–2.5)
ALT: 18 U/L (ref 6–29)
AST: 21 U/L (ref 10–35)
Albumin: 4.2 g/dL (ref 3.6–5.1)
Alkaline phosphatase (APISO): 59 U/L (ref 37–153)
BUN: 15 mg/dL (ref 7–25)
CO2: 29 mmol/L (ref 20–32)
Calcium: 9.6 mg/dL (ref 8.6–10.4)
Chloride: 102 mmol/L (ref 98–110)
Creat: 0.73 mg/dL (ref 0.50–1.03)
Globulin: 2 g/dL (calc) (ref 1.9–3.7)
Glucose, Bld: 82 mg/dL (ref 65–99)
Potassium: 3.3 mmol/L — ABNORMAL LOW (ref 3.5–5.3)
Sodium: 140 mmol/L (ref 135–146)
Total Bilirubin: 0.8 mg/dL (ref 0.2–1.2)
Total Protein: 6.2 g/dL (ref 6.1–8.1)

## 2020-10-28 LAB — CBC WITH DIFFERENTIAL/PLATELET
Absolute Monocytes: 288 cells/uL (ref 200–950)
Basophils Absolute: 39 cells/uL (ref 0–200)
Basophils Relative: 0.9 %
Eosinophils Absolute: 69 cells/uL (ref 15–500)
Eosinophils Relative: 1.6 %
HCT: 43.8 % (ref 35.0–45.0)
Hemoglobin: 14.6 g/dL (ref 11.7–15.5)
Lymphs Abs: 993 cells/uL (ref 850–3900)
MCH: 30.2 pg (ref 27.0–33.0)
MCHC: 33.3 g/dL (ref 32.0–36.0)
MCV: 90.5 fL (ref 80.0–100.0)
MPV: 11.3 fL (ref 7.5–12.5)
Monocytes Relative: 6.7 %
Neutro Abs: 2911 cells/uL (ref 1500–7800)
Neutrophils Relative %: 67.7 %
Platelets: 184 10*3/uL (ref 140–400)
RBC: 4.84 10*6/uL (ref 3.80–5.10)
RDW: 12.4 % (ref 11.0–15.0)
Total Lymphocyte: 23.1 %
WBC: 4.3 10*3/uL (ref 3.8–10.8)

## 2020-10-28 LAB — LIPID PANEL
Cholesterol: 230 mg/dL — ABNORMAL HIGH (ref <200)
HDL: 75 mg/dL (ref >=50)
LDL Cholesterol (Calc): 139 mg/dL (calc) — ABNORMAL HIGH
Non-HDL Cholesterol (Calc): 155 mg/dL (calc) — ABNORMAL HIGH (ref <130)
Total CHOL/HDL Ratio: 3.1 (calc) (ref <5.0)
Triglycerides: 68 mg/dL (ref <150)

## 2020-10-28 LAB — TSH: TSH: 1.25 mIU/L (ref 0.40–4.50)

## 2020-10-28 LAB — CK: Total CK: 67 U/L (ref 29–143)

## 2020-10-28 LAB — VITAMIN D 25 HYDROXY (VIT D DEFICIENCY, FRACTURES): Vit D, 25-Hydroxy: 42 ng/mL (ref 30–100)

## 2020-11-09 ENCOUNTER — Other Ambulatory Visit: Payer: Self-pay | Admitting: Family Medicine

## 2020-11-09 ENCOUNTER — Telehealth: Payer: Self-pay | Admitting: Family Medicine

## 2020-11-09 DIAGNOSIS — M159 Polyosteoarthritis, unspecified: Secondary | ICD-10-CM

## 2020-11-09 MED ORDER — TRAMADOL HCL 50 MG PO TABS
50.0000 mg | ORAL_TABLET | Freq: Four times a day (QID) | ORAL | 0 refills | Status: DC | PRN
Start: 2020-11-09 — End: 2020-12-14

## 2020-11-09 NOTE — Telephone Encounter (Signed)
Patient was scribed traMADol, last visit. And she is liking it and wanting refill  One month or 90 days, which ever works & is cheaper for her

## 2020-11-09 NOTE — Telephone Encounter (Signed)
Noted  

## 2020-11-09 NOTE — Telephone Encounter (Signed)
Please advise- tramadol no longer on med list.

## 2020-11-14 ENCOUNTER — Other Ambulatory Visit: Payer: Self-pay | Admitting: Family Medicine

## 2020-11-14 ENCOUNTER — Encounter: Payer: Self-pay | Admitting: *Deleted

## 2020-11-14 ENCOUNTER — Telehealth: Payer: Self-pay | Admitting: Family Medicine

## 2020-11-14 ENCOUNTER — Telehealth: Payer: Self-pay | Admitting: *Deleted

## 2020-11-14 NOTE — Telephone Encounter (Signed)
Prior auth for tramadol was done and was approved.  As long as you remain covered by your prescription drug plan and there are no changes to your plan benefits, this request is approved from 11/14/2020 to 05/15/2021.  Key: [id number] - PA Case ID: [id number]

## 2020-11-14 NOTE — Telephone Encounter (Signed)
Pt. Called in regarding medication. She went to pick up from pharmacy and she only had 5days worth. She wants to know what to do so she can continue taking medication

## 2020-11-22 NOTE — Telephone Encounter (Signed)
Called to follow up on message and she stated that she takes the tramadol 2-3 times a day for her arthritis.  The celebrex does not work for her.  She does not have insurance now so it will be expensive for office visits and uds.

## 2020-11-25 ENCOUNTER — Other Ambulatory Visit: Payer: Self-pay | Admitting: Family Medicine

## 2020-11-25 DIAGNOSIS — M79671 Pain in right foot: Secondary | ICD-10-CM

## 2020-11-25 DIAGNOSIS — M79672 Pain in left foot: Secondary | ICD-10-CM

## 2020-12-04 ENCOUNTER — Other Ambulatory Visit: Payer: Self-pay

## 2020-12-04 ENCOUNTER — Ambulatory Visit: Payer: No Typology Code available for payment source | Admitting: Internal Medicine

## 2020-12-04 ENCOUNTER — Encounter: Payer: Self-pay | Admitting: Internal Medicine

## 2020-12-04 VITALS — BP 126/80 | HR 48 | Ht 63.0 in | Wt 157.0 lb

## 2020-12-04 DIAGNOSIS — Z8249 Family history of ischemic heart disease and other diseases of the circulatory system: Secondary | ICD-10-CM

## 2020-12-04 DIAGNOSIS — R931 Abnormal findings on diagnostic imaging of heart and coronary circulation: Secondary | ICD-10-CM

## 2020-12-04 DIAGNOSIS — E7849 Other hyperlipidemia: Secondary | ICD-10-CM

## 2020-12-04 DIAGNOSIS — Z789 Other specified health status: Secondary | ICD-10-CM | POA: Diagnosis not present

## 2020-12-04 MED ORDER — NEXLETOL 180 MG PO TABS: 1.0000 | ORAL_TABLET | Freq: Every day | ORAL | 3 refills | Status: DC

## 2020-12-04 NOTE — Patient Instructions (Addendum)
Medication Instructions:  START nexletol 180mg  daily in addition to current medications  START aspirin 81mg  daily  *If you need a refill on your cardiac medications before your next appointment, please call your pharmacy*   Lab Work: FASTING lab work to check cholesterol in about 3 months  If you have labs (blood work) drawn today and your tests are completely normal, you will receive your results only by: [facility] (if you have MyChart) OR A paper copy in the mail If you have any lab test that is abnormal or we need to change your treatment, we will call you to review the results.   Follow-Up: At [facility], you and your health needs are our priority.  As part of our continuing mission to provide you with exceptional heart care, we have created designated Provider Care Teams.  These Care Teams include your primary Cardiologist (physician) and Advanced Practice Providers (APPs -  Physician Assistants and Nurse Practitioners) who all work together to provide you with the care you need, when you need it.  We recommend signing up for the patient portal called "MyChart".  Sign up information is provided on this After Visit Summary.  MyChart is used to connect with patients for Virtual Visits (Telemedicine).  Patients are able to view lab/test results, encounter notes, upcoming appointments, etc.  Non-urgent messages can be sent to your provider as well.   To learn more about what you can do with MyChart, go to [email or web address].    Your next appointment:   6 month(s)  The format for your next appointment:   In Person  Provider:   K. Mali Hilty, MD   Other Instructions

## 2020-12-04 NOTE — Progress Notes (Signed)
LIPID CLINIC CONSULT NOTE  Chief Complaint:  Hyperlipidemia  Primary Care Physician: Carollee Herter, Alferd Apa, DO  HPI:  Sonya [name] is a 60 y.o. female who is being seen today for the evaluation of hyperlipidemia and statin intolerance at the request of [name], *. This is a very pleasant 60 yo female with a long-standing history of dyslipidemia.  She was initially noted to have elevated cholesterol in her 61s.  She says she is been on intermittent treatment since then but has had significant side effects on multiple statin medications including atorvastatin, pravastatin and most recently Livalo.  All of which caused joint pain and aches as well as recently flulike symptoms with Livalo.  She does have a family history of coronary disease in her mom who had aortic stenosis in her father who had a artery disease and congestive heart failure.  Her brother died of an MI at age 62.  She also has 2 children ages 23 and 107, neither of which have had cholesterol testing.  Her most recent lipid profile showed total cholesterol 324, glycerides 77, HDL 61 and LDL of 247.  In addition she has some hypertension and arthritis but negative markers for RA.  She has no known coronary disease.  She does have a history of moderate obesity but underwent significant weight loss and is made dietary changes and exercises regularly.  She is asymptomatic denying any chest pain or worsening shortness of breath.  She is a lifelong non-smoker and denies any alcohol or drug use.  Reports a low-cholesterol diet which is low in saturated fats.  04/20/2018  Sonya [name] returns today for follow-up.  She has been doing well on Repatha.  She has seen a marked improvement in her lipid profile.  Over the past several months on medication, her LDL has gone down from 247-128.  Most recently her total cholesterol is 213 with that triglycerides of 70 and HDL 74.  She notes some mild headache the day after using the medication  but otherwise no significant side effects.  Unfortunately her coronary artery calcium score was quite abnormal.  This showed multivessel coronary calcification with a calcium score of 633, which is a high risk finding.  I discussed this with her and she is does seem asymptomatic and is quite active.  I do not think there is evidence for ischemia evaluation, especially in light of the recent ischemia trial, which suggested that even with patients that had ischemic noninvasive testing, medical therapy was as good as coronary intervention.  That being said her goal LDL is less than 70 and if we can further optimize her would be helpful.  05/13/2019  Sonya [name] is seen today in follow-up.  Overall she seems to be doing well on Repatha and I added ezetimibe.  She has not had repeat lipids, in fact, her last lipids showed total cholesterol 193, triglycerides 78, HDL 70 and LDL 107.  Target LDL is less than 70 especially with her multivessel coronary calcification.  She did not undergo nuclear stress testing despite her high coronary calcium score due to the fact she was asymptomatic.  She continues to be physically active without anginal complaints.  She occasionally gets some left-sided chest twinging, which she got at rest the other day at a church service on Easter, but does not sound particularly anginal to me.  She is able to exercise and walk without shortness of breath or any limitation  12/04/2020  Sonya [name] returns today for  follow-up.  Her lipids remain uncontrolled.  She had significant increase in calcium score.  Her last LDL cholesterol was 83 however is now gone up to 139, total cholesterol 238, triglycerides 68 and HDL of 75.  Unfortunate she cannot tolerate Zetia due to muscle aches very similar to the statins.  She is now just on monotherapy Repatha.  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  Her target LDL should be lower and likely less than 70 if achievable.  We discussed about options  to try to get there.  PMHx:  Past Medical History:  Diagnosis Date   Arthritis    Fibromyalgia    Hypertension     Past Surgical History:  Procedure Laterality Date   CESAREAN SECTION  1987    FAMHx:  Family History  Problem Relation Age of Onset   Arthritis Mother    Hyperlipidemia Mother    Hypertension Mother    Diabetes Mother    Cancer - Other Mother        vulvar   Aortic stenosis Mother    Lung cancer Mother    Cancer - Lung Mother    Arthritis Father    Hyperlipidemia Father    Heart failure Father        Hx disease   Hypertension Father    Stroke Father    Prostate cancer Father    Macular degeneration Father    Alzheimer's disease Father    Dementia Father    Alcohol abuse Sister    Subarachnoid hemorrhage Sister    Heart attack Brother    Stomach cancer Maternal Grandmother    Stroke Paternal Grandfather    Lung cancer Paternal Grandfather        smoker   Healthy Son    Healthy Son     SOCHx:   reports that she has never smoked. She has never used smokeless tobacco. She reports that she does not drink alcohol and does not use drugs.  ALLERGIES:  Allergies  Allergen Reactions   Atorvastatin Other (See Comments)    Leg cramps   Livalo [Pitavastatin] Other (See Comments)    Flu like symptoms - 2 mg   Praluent [Alirocumab]     Joint pain   Pravastatin Other (See Comments)    40 mg -myalgias    ROS: Pertinent items noted in HPI and remainder of comprehensive ROS otherwise negative.  HOME MEDS: Current Outpatient Medications on File Prior to Visit  Medication Sig Dispense Refill   ALPRAZolam (XANAX) 0.5 MG tablet Take 1 tablet (0.5 mg total) by mouth 3 (three) times daily as needed. 30 tablet 1   azelastine (ASTELIN) 0.1 % nasal spray Place 2 sprays into both nostrils 2 (two) times daily. Use in each nostril as directed 30 mL 3   Biotin 1000 MCG tablet Take 1,000 mcg by mouth daily.     celecoxib (CELEBREX) 200 MG capsule TAKE ONE CAPSULE  BY MOUTH TWICE DAILY AS NEEDED MODERATE PAIN 60 capsule 0   [employer] OMEGA-3 KRILL OIL 500 MG CAPS Take 1 capsule by mouth daily.     diclofenac sodium (VOLTAREN) 1 % GEL APPLY 1 APPLICATION TOPICALLY 4 (FOUR) TIMES DAILY AS NEEDED.*NOT COVERED* 100 g 2   estradiol (ESTRACE) 0.5 MG tablet Take 1 tablet (0.5 mg total) by mouth daily. 30 tablet 3   estrogen, conjugated,-medroxyprogesterone (PREMPRO) 0.3-1.5 MG tablet Take 1 tablet by mouth daily.     hydrochlorothiazide (HYDRODIURIL) 25 MG tablet Take 1 tablet (25  mg total) by mouth daily. 90 tablet 1   LORazepam (ATIVAN) 0.5 MG tablet TAKE 1 TABLET BY MOUTH TWICE A DAY AS NEEDED FOR ANXIETY 30 tablet 1   meclizine (ANTIVERT) 25 MG tablet Take 1 tablet (25 mg total) by mouth 3 (three) times daily as needed for dizziness. 30 tablet 0   methocarbamol (ROBAXIN) 500 MG tablet TAKE 1 TABLET BY MOUTH FOUR TIMES A DAY 30 tablet 1   NONFORMULARY OR COMPOUNDED ITEM cbd oil  Full spectrum 1 each 0   REPATHA SURECLICK 865 MG/ML SOAJ INJECT 1 DOSE INTO THE SKIN EVERY 14 (FOURTEEN) DAYS. 2 mL 11   ezetimibe (ZETIA) 10 MG tablet TAKE ONE TABLET BY MOUTH ONE TIME DAILY (Patient not taking: Reported on 12/04/2020) 90 tablet 0   No current facility-administered medications on file prior to visit.    LABS/IMAGING: No results found for this or any previous visit (from the past 48 hour(s)). No results found.  LIPID PANEL:    Component Value Date/Time   CHOL 230 (H) 10/27/2020 1552   CHOL 182 05/13/2019 1141   TRIG 68 10/27/2020 1552   HDL 75 10/27/2020 1552   HDL 71 05/13/2019 1141   CHOLHDL 3.1 10/27/2020 1552   VLDL 15.6 09/28/2018 1436   LDLCALC 139 (H) 10/27/2020 1552    WEIGHTS: Wt Readings from Last 3 Encounters:  12/04/20 157 lb (71.2 kg)  10/27/20 155 lb 9.6 oz (70.6 kg)  10/04/19 156 lb 12.8 oz (71.1 kg)    VITALS: BP 126/80 (BP Location: Left Arm, Patient Position: Sitting, Cuff Size: Normal)   Pulse (!) 48   Ht 5\' 3"  (1.6 m)   Wt 157 lb  (71.2 kg)   LMP 02/23/2016   BMI 27.81 kg/m   EXAM: Deferred  EKG: Sinus bradycardia at 48-personally reviewed  ASSESSMENT: 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  She is on Repatha but cannot tolerate statins or ezetimibe.  I recommended we add bempedoic acid 180 mg daily.  This should give her similar benefits to the Zetia but hopefully be better tolerated.  Her target LDL still less than 70.  She reports no symptoms related to her coronary calcification and is very active and says she eats healthy but does have a strong family history of heart disease.  We will plan repeat lipids in 3 months and follow-up with me in 6 months or sooner as necessary.  Pixie Casino, MD, [facility], [place] Director of the [facility]  [facility] Diplomate of the American Board of Clinical Lipidology Attending Cardiologist  Direct Dial: [phone]  Fax: [phone]  Website:  [email or web address]..Jonetta Osgood Becket Wecker 12/04/2020, 1:35 PM

## 2020-12-08 ENCOUNTER — Telehealth: Payer: Self-pay | Admitting: *Deleted

## 2020-12-08 NOTE — Telephone Encounter (Signed)
Checked in [facility] portal to see if Nexletol PA was needed. PA not required with her insurance plan

## 2020-12-12 ENCOUNTER — Telehealth: Payer: Self-pay

## 2020-12-12 ENCOUNTER — Other Ambulatory Visit: Payer: Self-pay | Admitting: Family Medicine

## 2020-12-12 DIAGNOSIS — Z1231 Encounter for screening mammogram for malignant neoplasm of breast: Secondary | ICD-10-CM

## 2020-12-12 NOTE — Telephone Encounter (Signed)
Pt called requesting a refill of Prempro. A Rx for Prempro was ordered from [employer] Patient Assistance automated service. The order will be released for processing on 12/18/20 and will arrive 7-10 business days after the order has been processed. The ID number for this order is [id number]. Telina Kleckley l Lashan Gluth, CMA

## 2020-12-13 ENCOUNTER — Other Ambulatory Visit: Payer: Self-pay | Admitting: Family Medicine

## 2020-12-13 DIAGNOSIS — M150 Primary generalized (osteo)arthritis: Secondary | ICD-10-CM

## 2020-12-13 DIAGNOSIS — M159 Polyosteoarthritis, unspecified: Secondary | ICD-10-CM

## 2020-12-14 ENCOUNTER — Other Ambulatory Visit: Payer: Self-pay | Admitting: Family Medicine

## 2020-12-14 DIAGNOSIS — M159 Polyosteoarthritis, unspecified: Secondary | ICD-10-CM

## 2020-12-14 MED ORDER — TRAMADOL HCL 50 MG PO TABS: 50.0000 mg | ORAL_TABLET | Freq: Four times a day (QID) | ORAL | 0 refills | Status: AC | PRN

## 2020-12-14 NOTE — Telephone Encounter (Signed)
Refill request for tramadol. No longer on med list.

## 2020-12-20 NOTE — Telephone Encounter (Signed)
Error

## 2020-12-29 ENCOUNTER — Other Ambulatory Visit: Payer: Self-pay | Admitting: Family Medicine

## 2020-12-29 DIAGNOSIS — F418 Other specified anxiety disorders: Secondary | ICD-10-CM

## 2020-12-29 DIAGNOSIS — I10 Essential (primary) hypertension: Secondary | ICD-10-CM

## 2020-12-29 DIAGNOSIS — G4700 Insomnia, unspecified: Secondary | ICD-10-CM

## 2021-01-01 NOTE — Telephone Encounter (Signed)
Requesting: lorazepam 0.5mg   Contract: 10/04/2019 UDS: 10/05/2019 Last Visit: 10/27/2020 Next Visit: 10/30/2021 Last Refill: 10/27/2020 #30 and 1RF  Please Advise

## 2021-01-02 ENCOUNTER — Telehealth: Payer: Self-pay

## 2021-01-02 NOTE — Telephone Encounter (Signed)
Spoke to [employer] at [employer]. Sonya [name] states that the pt's Prempro order was delivered to our office on 12/29/20. We have not received the order because our office was closed for the Thanksgiving holiday. Per Sonya [name] a letter stating that we did not receive the medication will need to be faxed to [phone] and it takes 24-48 hrs to process the order. A letter was faxed to [employer] to reorder Prempro. Understanding was voiced. Sonya [name] l Sonya [name], CMA

## 2021-01-04 NOTE — Telephone Encounter (Addendum)
Spoke with Nicole Kindred at [employer]. Nicole Kindred states the letter was received and a new order was placed. Per Nicole Kindred, the order will arrive in 7 business days..  Everette Dimauro l Zeva Leber, CMA

## 2021-01-24 ENCOUNTER — Other Ambulatory Visit: Payer: Self-pay | Admitting: Family Medicine

## 2021-01-24 DIAGNOSIS — M159 Polyosteoarthritis, unspecified: Secondary | ICD-10-CM

## 2021-01-24 NOTE — Telephone Encounter (Signed)
Refill request for tramadol 50mg - it is no longer on her med list- please advise.

## 2021-02-27 ENCOUNTER — Other Ambulatory Visit: Payer: Self-pay | Admitting: Family Medicine

## 2021-02-27 DIAGNOSIS — M79672 Pain in left foot: Secondary | ICD-10-CM

## 2021-02-27 DIAGNOSIS — M159 Polyosteoarthritis, unspecified: Secondary | ICD-10-CM

## 2021-02-28 NOTE — Telephone Encounter (Signed)
Refill request for tramadol 50mg - it is not on med list?

## 2021-04-03 ENCOUNTER — Telehealth: Payer: Self-pay

## 2021-04-03 NOTE — Telephone Encounter (Signed)
Called pt to see if she has enrolled in the [place] program. Left message for pt to call the office back.  Christyn Gutkowski l Kareema Keitt, CMA

## 2021-04-03 NOTE — Telephone Encounter (Signed)
-----   Message from Maurine Minister, [place] sent at 04/03/2021  9:01 AM EST ----- Regarding: RX REQUEST Rx for Prempo.

## 2021-04-04 ENCOUNTER — Other Ambulatory Visit: Payer: Self-pay | Admitting: Family Medicine

## 2021-04-04 DIAGNOSIS — F418 Other specified anxiety disorders: Secondary | ICD-10-CM

## 2021-04-04 DIAGNOSIS — G4700 Insomnia, unspecified: Secondary | ICD-10-CM

## 2021-04-05 ENCOUNTER — Telehealth: Payer: Self-pay

## 2021-04-05 NOTE — Telephone Encounter (Signed)
Patient called and states that she needs refill of her prempro through [employer] assistance program. Patient made aware we need to schedule her for an annual exam with Dr. Ihor Dow since she was last seen in Sept 2020.  ? ?Patient states that she is in training for a new job for ten weeks. Patient made aware that we need her to schedule annual to keep receiving refills.  ? ?Patient needs [place] to iniate refill with [employer] assistance program. Kathrene Alu RN  ?

## 2021-04-05 NOTE — Telephone Encounter (Signed)
Requesting: lorazepam 0.5mg  ?Contract: 10/04/2019 ?UDS: 10/05/2019 ?Last Visit: 10/27/2020 ?Next Visit:  10/30/2021 ?Last Refill: 01/01/2021 #30 and 1RF ? ?Please Advise ? ?

## 2021-04-09 ENCOUNTER — Encounter: Payer: Self-pay | Admitting: Family Medicine

## 2021-04-09 ENCOUNTER — Ambulatory Visit: Payer: BC Managed Care – PPO | Admitting: Family Medicine

## 2021-04-09 VITALS — BP 108/78 | HR 71 | Temp 97.7°F | Ht 62.0 in | Wt 159.5 lb

## 2021-04-09 DIAGNOSIS — M159 Polyosteoarthritis, unspecified: Secondary | ICD-10-CM

## 2021-04-09 DIAGNOSIS — R41840 Attention and concentration deficit: Secondary | ICD-10-CM | POA: Diagnosis not present

## 2021-04-09 MED ORDER — ATOMOXETINE HCL 25 MG PO CAPS: 25.0000 mg | ORAL_CAPSULE | Freq: Every day | ORAL | 1 refills | Status: DC

## 2021-04-09 MED ORDER — MELOXICAM 15 MG PO TABS: 15.0000 mg | ORAL_TABLET | Freq: Every day | ORAL | 1 refills | Status: DC

## 2021-04-09 NOTE — Patient Instructions (Addendum)
If you do not hear anything about your referral in the next 1-2 weeks, call our office and ask for an update. ? ?Aim to do some physical exertion for 150 minutes per week. This is typically divided into 5 days per week, 30 minutes per day. The activity should be enough to get your heart rate up. Anything is better than nothing if you have time constraints. ? ?Heat (pad or rice pillow in microwave) over affected area, 10-15 minutes twice daily.  ? ?Ice/cold pack over area for 10-15 min twice daily. ? ?OK to take Tylenol 1000 mg (2 extra strength tabs) or 975 mg (3 regular strength tabs) every 6 hours as needed. ? ?If you do not hear anything about your referral in the next 1-2 weeks, call our office and ask for an update. ? ?Let us know if you need anything. ? ?Hand Exercises ?Hand exercises can be helpful for almost anyone. These exercises can strengthen the hands, improve flexibility and movement, and increase blood flow to the hands. These results can make work and daily tasks easier. Hand exercises can be especially helpful for people who have joint pain from arthritis or have nerve damage from overuse (carpal tunnel syndrome). These exercises can also help people who have injured a hand. ?Exercises ?Most of these hand exercises are gentle stretching and motion exercises. It is usually safe to do them often throughout the day. Warming up your hands before exercise may help to reduce stiffness. You can do this with gentle massage or by placing your hands in warm water for 10-15 minutes. ?It is normal to feel some stretching, pulling, tightness, or mild discomfort as you begin new exercises. This will gradually improve. Stop an exercise right away if you feel sudden, severe pain or your pain gets worse. Ask your health care provider which exercises are best for you. ?Knuckle bend or "claw" fist ?Stand or sit with your arm, hand, and all five fingers pointed straight up. Make sure to keep your wrist straight during  the exercise. ?Gently bend your fingers down toward your palm until the tips of your fingers are touching the top of your palm. Keep your big knuckle straight and just bend the small knuckles in your fingers. ?Hold this position for 3 seconds. ?Straighten (extend) your fingers back to the starting position. ?Repeat this exercise 5-10 times with each hand. ?Full finger fist ?Stand or sit with your arm, hand, and all five fingers pointed straight up. Make sure to keep your wrist straight during the exercise. ?Gently bend your fingers into your palm until the tips of your fingers are touching the middle of your palm. ?Hold this position for 3 seconds. ?Extend your fingers back to the starting position, stretching every joint fully. ?Repeat this exercise 5-10 times with each hand. ?Straight fist ?Stand or sit with your arm, hand, and all five fingers pointed straight up. Make sure to keep your wrist straight during the exercise. ?Gently bend your fingers at the big knuckle, where your fingers meet your hand, and the middle knuckle. Keep the knuckle at the tips of your fingers straight and try to touch the bottom of your palm. ?Hold this position for 3 seconds. ?Extend your fingers back to the starting position, stretching every joint fully. ?Repeat this exercise 5-10 times with each hand. ?Tabletop ?Stand or sit with your arm, hand, and all five fingers pointed straight up. Make sure to keep your wrist straight during the exercise. ?Gently bend your fingers at the  big knuckle, where your fingers meet your hand, as far down as you can while keeping the small knuckles in your fingers straight. Think of forming a tabletop with your fingers. ?Hold this position for 3 seconds. ?Extend your fingers back to the starting position, stretching every joint fully. ?Repeat this exercise 5-10 times with each hand. ?Finger spread ?Place your hand flat on a table with your palm facing down. Make sure your wrist stays straight as you do  this exercise. ?Spread your fingers and thumb apart from each other as far as you can until you feel a gentle stretch. Hold this position for 3 seconds. ?Bring your fingers and thumb tight together again. Hold this position for 3 seconds. ?Repeat this exercise 5-10 times with each hand. ?Making circles ?Stand or sit with your arm, hand, and all five fingers pointed straight up. Make sure to keep your wrist straight during the exercise. ?Make a circle by touching the tip of your thumb to the tip of your index finger. ?Hold for 3 seconds. Then open your hand wide. ?Repeat this motion with your thumb and each finger on your hand. ?Repeat this exercise 5-10 times with each hand. ?Thumb motion ?Sit with your forearm resting on a table and your wrist straight. Your thumb should be facing up toward the ceiling. Keep your fingers relaxed as you move your thumb. ?Lift your thumb up as high as you can toward the ceiling. Hold for 3 seconds. ?Bend your thumb across your palm as far as you can, reaching the tip of your thumb for the small finger (pinkie) side of your palm. Hold for 3 seconds. ?Repeat this exercise 5-10 times with each hand. ?Grip strengthening ? ?Hold a stress ball or other soft ball in the middle of your hand. ?Slowly increase the pressure, squeezing the ball as much as you can without causing pain. Think of bringing the tips of your fingers into the middle of your palm. All of your finger joints should bend when doing this exercise. ?Hold your squeeze for 3 seconds, then relax. ?Repeat this exercise 5-10 times with each hand. ?Contact a health care provider if: ?Your hand pain or discomfort gets much worse when you do an exercise. ?Your hand pain or discomfort does not improve within 2 hours after you exercise. ?If you have any of these problems, stop doing these exercises right away. Do not do them again unless your health care provider says that you can. ?Get help right away if: ?You develop sudden, severe  hand pain or swelling. If this happens, stop doing these exercises right away. Do not do them again unless your health care provider says that you can. ?Make sure you discuss any questions you have with your health care provider. ?Document Revised: 05/14/2018 Document Reviewed: 01/22/2018 ?Elsevier Patient Education ? [place]. ?

## 2021-04-09 NOTE — Progress Notes (Signed)
Chief Complaint  ?Patient presents with  ? Arthritis  ?  ADHD ?  ? ? ?Subjective: ?Patient is a 61 y.o. female here for inattention. ? ?Diagnosed w ADHD in teens by GP. Never had a formal evaluation. +famhx in mom and both of her kids. Recently her parents passed away. 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 She is also dealing with her parents' estate issues.  ? ?History of osteoarthritis, worse in her hands.  Went to rheum and nothing to do as it was more likely osteoarthritis. + Family history of osteoarthritis in parents.  She has been taking Celebrex which has not been helpful.  Tylenol does not do anything.  She was prescribed tramadol which is sometimes helpful. ? ?Past Medical History:  ?Diagnosis Date  ? Arthritis   ? Fibromyalgia   ? Hypertension   ? ? ?Objective: ?BP 108/78   Pulse 71   Temp 97.7 ?F (36.5 ?C) (Oral)   Ht '5\' 2"'$  (1.575 m)   Wt 159 lb 8 oz (72.3 kg)   LMP 02/23/2016   SpO2 99%   BMI 29.17 kg/m?  ?General: Awake, appears stated age ?Heart: RRR, no LE edema ?Lungs: CTAB, no rales, wheezes or rhonchi. No accessory muscle use ?MSK: Heberden's nodes noted throughout both hands, some swelling noted of the PIP joints of the third and fifth digits on the right and third digit on the left, no obvious muscle atrophy/asymmetry ?Neuro: Grip strength is adequate ?Psych: Age appropriate judgment and insight, normal affect and mood ? ?Assessment and Plan: ?Inattention - Plan: atomoxetine (STRATTERA) 25 MG capsule ? ?Primary osteoarthritis involving multiple joints - Plan: meloxicam (MOBIC) 15 MG tablet, Ambulatory referral to Occupational Therapy ? ?Chronic, not currently controlled.  Patient preferred not to do a stimulant.  We will trial a lower dose of Strattera 25 mg daily and have her follow-up with her regular PCP in 1 month.  Can consider counseling/formal evaluation.  She did bring up cost is a barrier.  We will consider Tenex nightly if there are any  issues with this. ?Chronic, not controlled.  Trial meloxicam 15 mg daily along with tramadol as needed.  She can use Tylenol as needed as well for an adjunct.  I did give her stretches and exercises for her hands.  She was instructed not to do anything that is too painful.  We will refer to occupational therapy for further treatment assuming there are no cost barriers.  Would consider referral to the hand team to see if they can do anything as it has been over 15 years since she saw them last.  She we will follow-up in 1 month with a regular PCP to recheck this as well. ?The patient voiced understanding and agreement to the plan. ? ?Shelda Pal, DO ?04/09/21  ?8:55 AM ? ? ? ? ?

## 2021-04-17 ENCOUNTER — Ambulatory Visit: Payer: No Typology Code available for payment source

## 2021-04-20 ENCOUNTER — Other Ambulatory Visit: Payer: Self-pay | Admitting: Internal Medicine

## 2021-04-20 MED ORDER — REPATHA SURECLICK 140 MG/ML ~~LOC~~ SOAJ: 1.0000 | SUBCUTANEOUS | 2 refills | Status: DC

## 2021-04-20 NOTE — Telephone Encounter (Signed)
Unable to take Praluent d/t side effects. ?

## 2021-04-20 NOTE — Telephone Encounter (Signed)
Rx(s) sent to pharmacy electronically.  

## 2021-04-20 NOTE — Addendum Note (Signed)
Addended by: Fidel Levy on: 04/20/2021 01:22 PM ? ? Modules accepted: Orders ? ?

## 2021-05-01 ENCOUNTER — Other Ambulatory Visit: Payer: Self-pay | Admitting: Internal Medicine

## 2021-05-01 NOTE — Telephone Encounter (Signed)
Rx(s) sent to pharmacy electronically.  

## 2021-05-04 ENCOUNTER — Telehealth: Payer: Self-pay

## 2021-05-04 NOTE — Telephone Encounter (Signed)
Spoke to Sonya [name] with the [employer] Patient Assistance Program. Lattie Haw states patient will need to start the enrollment process to have her Prempro refilled. A new application will be faxed to the office and the patient will be notified. ?Yosmar Ryker l Lace Chenevert, CMA  ?

## 2021-05-08 ENCOUNTER — Other Ambulatory Visit: Payer: Self-pay

## 2021-05-08 DIAGNOSIS — Z780 Asymptomatic menopausal state: Secondary | ICD-10-CM

## 2021-05-08 MED ORDER — PREMPRO 0.3-1.5 MG PO TABS: 1.0000 | ORAL_TABLET | Freq: Every day | ORAL | 1 refills | Status: DC

## 2021-05-15 ENCOUNTER — Ambulatory Visit
Admission: RE | Admit: 2021-05-15 | Discharge: 2021-05-15 | Disposition: A | Discharge status: Home / Self Care | Payer: No Typology Code available for payment source | Source: Ambulatory Visit | Attending: Family Medicine | Admitting: Family Medicine

## 2021-05-15 DIAGNOSIS — Z1231 Encounter for screening mammogram for malignant neoplasm of breast: Secondary | ICD-10-CM

## 2021-05-28 ENCOUNTER — Other Ambulatory Visit: Payer: Self-pay | Admitting: Family Medicine

## 2021-05-28 DIAGNOSIS — G4700 Insomnia, unspecified: Secondary | ICD-10-CM

## 2021-05-28 DIAGNOSIS — F418 Other specified anxiety disorders: Secondary | ICD-10-CM

## 2021-05-29 ENCOUNTER — Telehealth: Payer: Self-pay

## 2021-05-29 NOTE — Telephone Encounter (Signed)
Requesting: lorazepam 0.'5mg'$   ?Contract: 10/04/19 ?UDS: 10/04/19 ?Last Visit: 10/27/20 ?Next Visit: 10/30/21 ?Last Refill: 04/05/21 #30 and 1RF ?Pt sig: 1 tab bid prn ? ?Please Advise ? ?

## 2021-05-29 NOTE — Telephone Encounter (Signed)
Called pt to inform her that [employer] faxed a letter stating she does not meet the eligibility requirements for the program because she has insurance through her job. Pt was given the number to [employer].Understanding was voiced. ?Thaniel Coluccio l Shady Padron, CMA  ?

## 2021-05-30 ENCOUNTER — Other Ambulatory Visit (HOSPITAL_COMMUNITY)
Admission: RE | Admit: 2021-05-30 | Discharge: 2021-05-30 | Disposition: A | Discharge status: Home / Self Care | Payer: No Typology Code available for payment source | Source: Ambulatory Visit | Attending: Obstetrics & Gynecology | Admitting: Obstetrics & Gynecology

## 2021-05-30 ENCOUNTER — Ambulatory Visit (INDEPENDENT_AMBULATORY_CARE_PROVIDER_SITE_OTHER): Payer: No Typology Code available for payment source | Admitting: Obstetrics & Gynecology

## 2021-05-30 ENCOUNTER — Telehealth: Payer: Self-pay | Admitting: Family Medicine

## 2021-05-30 ENCOUNTER — Encounter: Payer: Self-pay | Admitting: Obstetrics & Gynecology

## 2021-05-30 VITALS — BP 127/84 | HR 67 | Ht 63.0 in | Wt 159.0 lb

## 2021-05-30 DIAGNOSIS — Z01419 Encounter for gynecological examination (general) (routine) without abnormal findings: Secondary | ICD-10-CM | POA: Diagnosis present

## 2021-05-30 DIAGNOSIS — Z1211 Encounter for screening for malignant neoplasm of colon: Secondary | ICD-10-CM

## 2021-05-30 MED ORDER — GUANFACINE HCL 1 MG PO TABS: 1.0000 mg | ORAL_TABLET | Freq: Every day | ORAL | 0 refills | Status: DC

## 2021-05-30 NOTE — Progress Notes (Signed)
Subjective:  ?  ? Sonya [name] is a 61 y.o. female here for a routine exam.  Current complaints: none. Both of her parents died in the past 2 years. She cared for her parents until they died. She is married. She and husband have led separate lives for >30 years. Kids are adults. Has not had colon cancer screening.    Her sleep has improved since post early menopause.     ?  ?Gynecologic History ?Patient's last menstrual period was 02/23/2016. ?Contraception: post menopausal status ?Last Pap: 10/28/2018. Results were: normal ?Last mammogram: 05/15/2021. Results were: normal ? ?Obstetric History ?OB History  ?Gravida Para Term Preterm AB Living  ?'2 2 2     2  '$ ?SAB IAB Ectopic Multiple Live Births  ?        2  ?  ?# Outcome Date GA Lbr Len/2nd Weight Sex Delivery Anes PTL Lv  ?2 Term 1991 [redacted]w[redacted]d  M Vag-Spont EPI N LIV  ?1 Term 1987 429w0d M CS-LTranv Spinal N LIV  ? ? ? ?The following portions of the patient's history were reviewed and updated as appropriate: allergies, current medications, past family history, past medical history, past social history, past surgical history, and problem list. ? ?Review of Systems ?Pertinent items are noted in HPI.  ?  ?Objective:  ?BP 127/84   Pulse 67   Ht '5\' 3"'$  (1.6 m)   Wt 159 lb (72.1 kg)   LMP 02/23/2016   BMI 28.17 kg/m?  ? ?General Appearance:    Alert, cooperative, no distress, appears stated age  ?Head:    Normocephalic, without obvious abnormality, atraumatic  ?Eyes:    conjunctiva/corneas clear, EOM's intact, both eyes  ?Ears:    Normal external ear canals, both ears  ?Nose:   Nares normal, septum midline, mucosa normal, no drainage    or sinus tenderness  ?Throat:   Lips, mucosa, and tongue normal; teeth and gums normal  ?Neck:   Supple, symmetrical, trachea midline, no adenopathy;  ?  thyroid:  no enlargement/tenderness/nodules  ?Back:     Symmetric, no curvature, ROM normal, no CVA tenderness  ?Lungs:     respirations unlabored  ?Chest Wall:    No tenderness or  deformity  ? Heart:    Regular rate and rhythm  ?Breast Exam:    No tenderness, masses, or nipple abnormality  ?Abdomen:     Soft, non-tender, bowel sounds active all four quadrants,  ?  no masses, no organomegaly  ?Genitalia:    Normal female without lesion, discharge or tenderness  ?   ?Extremities:   Extremities normal, atraumatic, no cyanosis or edema  ?Pulses:   2+ and symmetric all extremities  ?Skin:   Skin color, texture, turgor normal, no rashes or lesions  ?  ? ?Assessment:  ? ? Healthy female exam.  ?Colon cancer screening  ?Situational stress. Marital concerns - rec counseling. Pt will inquire about counseling services at her church.    ?  ?Plan:  ?BoMicaylaas seen today for gynecologic exam. ? ?Diagnoses and all orders for this visit: ? ?Well female exam with routine gynecological exam ?-     Cytology - PAP( [place]) ? ?Colon cancer screening ?-     Ambulatory referral to Gastroenterology ? ? F/u in 1 year or sooner prn  ? ?Raekwon Winkowski L. Harraway-Smith, M.D., FACloster  ?

## 2021-05-30 NOTE — Telephone Encounter (Signed)
Pt stated this medication was prescribed by Dr.Wendling, and she was told she could contact him as well.  ?

## 2021-05-30 NOTE — Progress Notes (Signed)
Last mammogram 05/16/21 ?Last pap 10-28-18 ? ?

## 2021-05-30 NOTE — Telephone Encounter (Signed)
Patient states her insurance changed and they no longer cover her atomoxetine (STRATTERA) 25 MG capsule  medication. She would like to know if it can be changed to Ritalin or something cheaper. Please advise.  ?

## 2021-05-30 NOTE — Telephone Encounter (Signed)
I will send in Tenex given cost issues. I wanted her to follow up with Dr. Etter Sjogren around 1 mo ago. Please schedule a follow up with her for the pt.  Ty.  ?

## 2021-06-01 NOTE — Telephone Encounter (Signed)
Pt called. LDVM 

## 2021-06-04 LAB — CYTOLOGY - PAP
Comment: NEGATIVE
Diagnosis: UNDETERMINED — AB
High risk HPV: NEGATIVE

## 2021-06-05 ENCOUNTER — Telehealth: Payer: Self-pay

## 2021-06-05 NOTE — Telephone Encounter (Signed)
Patient called and no answer. Will send my chart message Kathrene Alu RN  ?

## 2021-06-05 NOTE — Telephone Encounter (Signed)
-----   Message from Lavonia Drafts, MD sent at 06/05/2021  7:24 AM EDT ----- ?Please call pt. Her PAP shows ASCUS neg hrHPV. She needs a repeat PAP in 1 year.  ? ?Thx ?Carolyn L. Harraway-Smith, M.D., [place] ? ?

## 2021-06-12 ENCOUNTER — Telehealth: Payer: Self-pay

## 2021-06-12 NOTE — Telephone Encounter (Signed)
Called to let patient know her prempro through [employer] patient assistance program is available for pick up. ? ?Patient also made aware of ASCUS cell on pap smear and need to ensure she returns for a pap yearly to watch this. Kathrene Alu RN  ?

## 2021-06-15 ENCOUNTER — Other Ambulatory Visit: Payer: Self-pay | Admitting: Family Medicine

## 2021-06-15 DIAGNOSIS — M159 Polyosteoarthritis, unspecified: Secondary | ICD-10-CM

## 2021-06-21 ENCOUNTER — Other Ambulatory Visit: Payer: Self-pay | Admitting: Family Medicine

## 2021-06-30 ENCOUNTER — Other Ambulatory Visit: Payer: Self-pay | Admitting: Family Medicine

## 2021-06-30 DIAGNOSIS — M79671 Pain in right foot: Secondary | ICD-10-CM

## 2021-07-04 ENCOUNTER — Telehealth: Payer: Self-pay | Admitting: Internal Medicine

## 2021-07-04 NOTE — Telephone Encounter (Signed)
Attempted PA for Repatha via request generated from [facility] (Key: [id number])  Message received:  Your PA request cannot be processed for the member plan submitted. For further inquiries please contact the number on the back of the member prescription card. (Message [phone])

## 2021-07-05 NOTE — Telephone Encounter (Signed)
Completed ?'s and added chart notes, allergies, labs will route to jenna elkins to make her aware

## 2021-07-05 NOTE — Telephone Encounter (Signed)
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

## 2021-07-11 ENCOUNTER — Ambulatory Visit: Payer: No Typology Code available for payment source | Admitting: Medical

## 2021-07-11 ENCOUNTER — Encounter: Payer: Self-pay | Admitting: Medical

## 2021-07-11 VITALS — BP 140/80 | HR 57 | Temp 98.6°F | Resp 18 | Ht 63.0 in | Wt 162.0 lb

## 2021-07-11 DIAGNOSIS — W57XXXA Bitten or stung by nonvenomous insect and other nonvenomous arthropods, initial encounter: Secondary | ICD-10-CM | POA: Diagnosis not present

## 2021-07-11 DIAGNOSIS — S20361A Insect bite (nonvenomous) of right front wall of thorax, initial encounter: Secondary | ICD-10-CM

## 2021-07-11 MED ORDER — DOXYCYCLINE HYCLATE 100 MG PO TABS: 100.0000 mg | ORAL_TABLET | Freq: Two times a day (BID) | ORAL | 0 refills | Status: DC

## 2021-07-11 NOTE — Patient Instructions (Addendum)
Tick bite rt upper chest and rt ear. Area of rt upper chest had large tick attached and area could be secondarily infected as well. Rx doxycycline antibiotic. Rx advisement given.   Discussed getting labs. Placing future labs to be done in a week if you decide to get.   For nasal congestion can use flonase in morning and saline spray at night.  Follow up as needed if red area persists or other signs/symptoms persist.

## 2021-07-11 NOTE — Progress Notes (Signed)
Subjective:    Patient ID: Sonya [name], female    DOB: 05-19-1960, 61 y.o.   MRN: [id number]  HPI  Pt in for recent tick bite. Found 2 ticks. Had one in rt upper chest and inside top of her ear.  Pt thinks tick on rt upper chest was there more than a week. Slight red around tick before pulled off and then more discored/red after pulled of. Pt used tweezers and pulled off last night. 2 days ago she was pulling on tick thinking was skin tag.  Pt has mild sore today. Intermittent sore in past. Last time was sore was year and  half ago.  Some nasal congestion recently over past week.  Review of Systems  Constitutional:  Negative for appetite change, diaphoresis, fatigue and fever.  Respiratory:  Negative for cough, chest tightness, shortness of breath and wheezing.   Cardiovascular:  Negative for chest pain and palpitations.  Gastrointestinal:  Negative for abdominal pain.  Genitourinary:  Negative for dysuria, flank pain and frequency.  Musculoskeletal:  Negative for back pain.  Skin:  Positive for rash.       Tick bite.  Hematological:  Negative for adenopathy. Does not bruise/bleed easily.  Psychiatric/Behavioral:  Negative for behavioral problems and confusion. The patient is not nervous/anxious.     Past Medical History:  Diagnosis Date   Arthritis    Fibromyalgia    Hypertension      Social History   Socioeconomic History   Marital status: Married    Spouse name: Not on file   Number of children: Not on file   Years of education: Not on file   Highest education level: Not on file  Occupational History   Occupation: hair dresser  Tobacco Use   Smoking status: Never   Smokeless tobacco: Never  Vaping Use   Vaping Use: Never used  Substance and Sexual Activity   Alcohol use: No   Drug use: No   Sexual activity: Not Currently    Birth control/protection: None  Other Topics Concern   Not on file  Social History Narrative   Not on file   Social  Determinants of Health   Financial Resource Strain: Not on file  Food Insecurity: Not on file  Transportation Needs: Not on file  Physical Activity: Not on file  Stress: Not on file  Social Connections: Not on file  Intimate Partner Violence: Not on file    Past Surgical History:  Procedure Laterality Date   CESAREAN SECTION  1987    Family History  Problem Relation Age of Onset   Arthritis Mother    Hyperlipidemia Mother    Hypertension Mother    Diabetes Mother    Cancer - Other Mother        vulvar   Aortic stenosis Mother    Lung cancer Mother    Cancer - Lung Mother    Arthritis Father    Hyperlipidemia Father    Heart failure Father        Hx disease   Hypertension Father    Stroke Father    Prostate cancer Father    Macular degeneration Father    Alzheimer's disease Father    Dementia Father    Alcohol abuse Sister    Subarachnoid hemorrhage Sister    Heart attack Brother    Stomach cancer Maternal Grandmother    Stroke Paternal Grandfather    Lung cancer Paternal Grandfather        smoker  Healthy Son    Healthy Son     Allergies  Allergen Reactions   Atorvastatin Other (See Comments)    Leg cramps   Livalo [Pitavastatin] Other (See Comments)    Flu like symptoms - 2 mg   Praluent [Alirocumab]     Joint pain   Pravastatin Other (See Comments)    40 mg -myalgias   Zetia [Ezetimibe] Other (See Comments)    Muscle Aches    Current Outpatient Medications on File Prior to Visit  Medication Sig Dispense Refill   ALPRAZolam (XANAX) 0.5 MG tablet Take 1 tablet (0.5 mg total) by mouth 3 (three) times daily as needed. 30 tablet 1   aspirin EC 81 MG tablet Take 81 mg by mouth daily. Swallow whole.     atomoxetine (STRATTERA) 25 MG capsule Take 1 capsule (25 mg total) by mouth daily. (Patient not taking: Reported on 05/30/2021) 30 capsule 1   azelastine (ASTELIN) 0.1 % nasal spray Place 2 sprays into both nostrils 2 (two) times daily. Use in each nostril  as directed 30 mL 3   Bempedoic Acid (NEXLETOL) 180 MG TABS Take 1 tablet by mouth daily. 90 tablet 3   Biotin 1000 MCG tablet Take 1,000 mcg by mouth daily.     [facility] OMEGA-3 KRILL OIL 500 MG CAPS Take 1 capsule by mouth daily.     diclofenac sodium (VOLTAREN) 1 % GEL APPLY 1 APPLICATION TOPICALLY 4 (FOUR) TIMES DAILY AS NEEDED.*NOT COVERED* 100 g 2   estradiol (ESTRACE) 0.5 MG tablet Take 1 tablet (0.5 mg total) by mouth daily. (Patient not taking: Reported on 05/30/2021) 30 tablet 3   estrogen, conjugated,-medroxyprogesterone (PREMPRO) 0.3-1.5 MG tablet Take 1 tablet by mouth daily. 30 tablet 1   guanFACINE (TENEX) 1 MG tablet TAKE 1 TABLET BY MOUTH AT BEDTIME. 30 tablet 0   hydrochlorothiazide (HYDRODIURIL) 25 MG tablet TAKE 1 TABLET (25 MG TOTAL) BY MOUTH DAILY. 90 tablet 2   LORazepam (ATIVAN) 0.5 MG tablet TAKE 1 TABLET BY MOUTH TWICE A DAY AS NEEDED FOR ANXIETY 30 tablet 1   meclizine (ANTIVERT) 25 MG tablet Take 1 tablet (25 mg total) by mouth 3 (three) times daily as needed for dizziness. 30 tablet 0   meloxicam (MOBIC) 15 MG tablet TAKE 1 TABLET (15 MG TOTAL) BY MOUTH DAILY. 30 tablet 1   methocarbamol (ROBAXIN) 500 MG tablet TAKE 1 TABLET BY MOUTH FOUR TIMES A DAY 30 tablet 1   NONFORMULARY OR COMPOUNDED ITEM cbd oil  Full spectrum 1 each 0   REPATHA SURECLICK 161 MG/ML SOAJ INJECT 1 DOSE INTO THE SKIN EVERY 14 (FOURTEEN) DAYS. 2 mL 5   No current facility-administered medications on file prior to visit.    BP (!) 148/80   Pulse (!) 57   Temp 98.6 F (37 C)   Resp 18   Ht '5\' 3"'$  (1.6 m)   Wt 162 lb (73.5 kg)   LMP 02/23/2016   SpO2 100%   BMI 28.70 kg/m       Objective:   Physical Exam   General- No acute distress. Pleasant patient. Neck- Full range of motion, no jvd Lungs- Clear, even and unlabored. Heart- regular rate and rhythm. Neurologic- CNII- XII grossly intact.  Skin- 1 cm red area with small induration where tick was attached rt upper chest. Rt ear- no  abnormality seen upper ear.      Assessment & Plan:   Patient Instructions  Tick bite rt upper chest and rt ear.  Area of rt upper chest had large tick attached and area could be secondarily infected as well. Rx doxycycline antibiotic. Rx advisement given.   Discussed getting labs. Placing future labs to be done in a week if you decide to get.   Follow up as needed if red area persists or other signs/symptoms persist.   Mackie Pai, PA-C

## 2021-07-17 NOTE — Telephone Encounter (Signed)
Spoke with [facility] about Repatha  Was informed med picked up on 07/10/21 and is covered/approved w/insurance

## 2021-07-22 ENCOUNTER — Other Ambulatory Visit: Payer: Self-pay | Admitting: Family Medicine

## 2021-07-23 ENCOUNTER — Other Ambulatory Visit: Payer: Self-pay | Admitting: Family Medicine

## 2021-07-23 DIAGNOSIS — G4700 Insomnia, unspecified: Secondary | ICD-10-CM

## 2021-07-23 DIAGNOSIS — F418 Other specified anxiety disorders: Secondary | ICD-10-CM

## 2021-07-24 ENCOUNTER — Telehealth: Payer: Self-pay

## 2021-07-24 ENCOUNTER — Other Ambulatory Visit: Payer: No Typology Code available for payment source

## 2021-07-24 NOTE — Telephone Encounter (Signed)
Pt didn't have labs done today. Please advise

## 2021-07-24 NOTE — Telephone Encounter (Signed)
Pt called stating that she was seen by Percell Miller. She says she has a lab appointment at 11am and says ES mentioned adding 3 more days of antibiotics. She wants to know if it is worth taking the 3 additional tabs before paying for the labs that she is scheduled for at 11am.  CB number is [phone]

## 2021-07-25 NOTE — Telephone Encounter (Signed)
Pt called. Pt states the bite is just a bump now and no other sxs. Pt does states having some dizziness and nausea but states it could be from allergies. I also advised it could have been from the antibiotic as well. Pt was advised if bump does not go down and continues to have sxs to call to set up a lab appt or a f/u with [facility]

## 2021-08-14 ENCOUNTER — Telehealth: Payer: Self-pay | Admitting: Family Medicine

## 2021-08-14 ENCOUNTER — Ambulatory Visit: Payer: No Typology Code available for payment source | Admitting: Family

## 2021-08-14 VITALS — BP 124/80 | HR 77 | Temp 98.6°F | Resp 16 | Ht 64.0 in | Wt 161.0 lb

## 2021-08-14 DIAGNOSIS — J019 Acute sinusitis, unspecified: Secondary | ICD-10-CM

## 2021-08-14 MED ORDER — BENZONATATE 100 MG PO CAPS: 100.0000 mg | ORAL_CAPSULE | Freq: Three times a day (TID) | ORAL | 0 refills | Status: DC | PRN

## 2021-08-14 MED ORDER — AMOXICILLIN-POT CLAVULANATE 875-125 MG PO TABS: 1.0000 | ORAL_TABLET | Freq: Two times a day (BID) | ORAL | 0 refills | Status: AC

## 2021-08-14 NOTE — Progress Notes (Signed)
Sonya [name] is a 61 y.o. female with the following history as recorded in EpicCare:  Patient Active Problem List   Diagnosis Date Noted   Primary osteoarthritis of both hands 06/20/2019   Primary osteoarthritis of both knees 06/20/2019   Primary osteoarthritis of both feet 06/20/2019   Agatston coronary artery calcium score greater than 400 04/20/2018   Familial hyperlipidemia 12/26/2017   Family history of heart disease 12/26/2017   Statin intolerance 12/26/2017   Preventative health care 09/28/2017   Arthritis 09/28/2017   Essential hypertension 05/14/2016   Generalized anxiety disorder 05/14/2016    Current Outpatient Medications  Medication Sig Dispense Refill   ALPRAZolam (XANAX) 0.5 MG tablet Take 1 tablet (0.5 mg total) by mouth 3 (three) times daily as needed. 30 tablet 1   amoxicillin-clavulanate (AUGMENTIN) 875-125 MG tablet Take 1 tablet by mouth 2 (two) times daily for 10 days. 20 tablet 0   aspirin EC 81 MG tablet Take 81 mg by mouth daily. Swallow whole.     atomoxetine (STRATTERA) 25 MG capsule Take 1 capsule (25 mg total) by mouth daily. 30 capsule 1   azelastine (ASTELIN) 0.1 % nasal spray Place 2 sprays into both nostrils 2 (two) times daily. Use in each nostril as directed 30 mL 3   Bempedoic Acid (NEXLETOL) 180 MG TABS Take 1 tablet by mouth daily. 90 tablet 3   benzonatate (TESSALON) 100 MG capsule Take 1 capsule (100 mg total) by mouth 3 (three) times daily as needed. 20 capsule 0   Biotin 1000 MCG tablet Take 1,000 mcg by mouth daily.     [facility] OMEGA-3 KRILL OIL 500 MG CAPS Take 1 capsule by mouth daily.     diclofenac sodium (VOLTAREN) 1 % GEL APPLY 1 APPLICATION TOPICALLY 4 (FOUR) TIMES DAILY AS NEEDED.*NOT COVERED* 100 g 2   estradiol (ESTRACE) 0.5 MG tablet Take 1 tablet (0.5 mg total) by mouth daily. 30 tablet 3   estrogen, conjugated,-medroxyprogesterone (PREMPRO) 0.3-1.5 MG tablet Take 1 tablet by mouth daily. 30 tablet 1   guanFACINE (TENEX) 1 MG tablet  TAKE 1 TABLET BY MOUTH EVERYDAY AT BEDTIME 90 tablet 1   hydrochlorothiazide (HYDRODIURIL) 25 MG tablet TAKE 1 TABLET (25 MG TOTAL) BY MOUTH DAILY. 90 tablet 2   LORazepam (ATIVAN) 0.5 MG tablet TAKE 1 TABLET BY MOUTH TWICE A DAY AS NEEDED FOR ANXIETY 30 tablet 1   meclizine (ANTIVERT) 25 MG tablet Take 1 tablet (25 mg total) by mouth 3 (three) times daily as needed for dizziness. 30 tablet 0   meloxicam (MOBIC) 15 MG tablet TAKE 1 TABLET (15 MG TOTAL) BY MOUTH DAILY. 30 tablet 1   methocarbamol (ROBAXIN) 500 MG tablet TAKE 1 TABLET BY MOUTH FOUR TIMES A DAY 30 tablet 1   NONFORMULARY OR COMPOUNDED ITEM cbd oil  Full spectrum 1 each 0   REPATHA SURECLICK 696 MG/ML SOAJ INJECT 1 DOSE INTO THE SKIN EVERY 14 (FOURTEEN) DAYS. 2 mL 5   No current facility-administered medications for this visit.    Allergies: Atorvastatin, Livalo [pitavastatin], Praluent [alirocumab], Pravastatin, and Zetia [ezetimibe]  Past Medical History:  Diagnosis Date   Arthritis    Fibromyalgia    Hypertension     Past Surgical History:  Procedure Laterality Date   CESAREAN SECTION  1987    Family History  Problem Relation Age of Onset   Arthritis Mother    Hyperlipidemia Mother    Hypertension Mother    Diabetes Mother    Cancer -  Other Mother        vulvar   Aortic stenosis Mother    Lung cancer Mother    Cancer - Lung Mother    Arthritis Father    Hyperlipidemia Father    Heart failure Father        Hx disease   Hypertension Father    Stroke Father    Prostate cancer Father    Macular degeneration Father    Alzheimer's disease Father    Dementia Father    Alcohol abuse Sister    Subarachnoid hemorrhage Sister    Heart attack Brother    Stomach cancer Maternal Grandmother    Stroke Paternal Grandfather    Lung cancer Paternal Grandfather        smoker   Healthy Son    Healthy Son     Social History   Tobacco Use   Smoking status: Never   Smokeless tobacco: Never  Substance Use Topics    Alcohol use: No    Subjective:   Sore throat/ cough x 1 week; + laryngitis x 4-5 days; no fever; notes she is prone to recurrent sinus infections; has had some dizziness;     Objective:  Vitals:   08/14/21 1452  BP: 124/80  Pulse: 77  Resp: 16  Temp: 98.6 F (37 C)  TempSrc: Oral  SpO2: 95%  Weight: 161 lb (73 kg)  Height: '5\' 4"'$  (1.626 m)    General: Well developed, well nourished, in no acute distress  Skin : Warm and dry.  Head: Normocephalic and atraumatic  Eyes: Sclera and conjunctiva clear; pupils round and reactive to light; extraocular movements intact  Ears: External normal; canals clear; tympanic membranes congested Oropharynx: Pink, supple. No suspicious lesions  Neck: Supple without thyromegaly, adenopathy  Lungs: Respirations unlabored;  Neurologic: Alert and oriented; speech intact; face symmetrical;   Assessment:  1. Acute sinusitis, recurrence not specified, unspecified location     Plan:  Patient defers taking oral prednisone; she agrees to continue using her Astelin NS; Rx for Augmentin and Tessalon Perles;   No follow-ups on file.  No orders of the defined types were placed in this encounter.   Requested Prescriptions   Signed Prescriptions Disp Refills   amoxicillin-clavulanate (AUGMENTIN) 875-125 MG tablet 20 tablet 0    Sig: Take 1 tablet by mouth 2 (two) times daily for 10 days.   benzonatate (TESSALON) 100 MG capsule 20 capsule 0    Sig: Take 1 capsule (100 mg total) by mouth 3 (three) times daily as needed.

## 2021-08-14 NOTE — Telephone Encounter (Signed)
Nurse Assessment Nurse: Ottis Stain, RN, Sherrie Date/Time (Eastern Time): 08/14/2021 11:43:58 AM Confirm and document reason for call. If symptomatic, describe symptoms. ---Caller states friend has laryngitis for the last several days. +sore throat for past week. +nasal congestion with clear mucous, +cough with green sputum. Also with nausea and dizziness (spinning) for the last 2 weeks. No fever. +fluids, +UOP in last 8 hrs. Does the patient have any new or worsening symptoms? ---Yes Will a triage be completed? ---Yes Related visit to physician within the last 2 weeks? ---No Does the PT have any chronic conditions? (i.e. diabetes, asthma, this includes High risk factors for pregnancy, etc.) ---Yes List chronic conditions. ---HTN, RA Is this a behavioral health or substance abuse call? ---No Guidelines Guideline Title Affirmed Question Affirmed Notes Nurse Date/Time Eilene Ghazi Time) Cough - Acute Productive [1] MILD difficulty breathing (e.g., minimal/no SOB Ottis Stain, RN, Sherrie 08/14/2021 11:47:42 AM PLEASE NOTE: All timestamps contained within this report are represented as [place] Standard Time. CONFIDENTIALTY NOTICE: This fax transmission is intended only for the addressee. It contains information that is legally privileged, confidential or otherwise protected from use or disclosure. If you are not the intended recipient, you are strictly prohibited from reviewing, disclosing, copying using or disseminating any of this information or taking any action in reliance on or regarding this information. If you have received this fax in error, please notify us immediately by telephone so that we can arrange for its return to [place]. Phone: [phone], Toll-Free: [phone], Fax: [phone] Page: 2 of 2 Call Id: [id number] Guidelines Guideline Title Affirmed Question Affirmed Notes Nurse Date/Time Eilene Ghazi Time) at rest, SOB with walking, pulse <100) AND [2] still present when not  coughing Disp. Time Eilene Ghazi Time) Disposition Final User 08/14/2021 11:06:29 AM Attempt made - message left Ottis Stain, RN, [place] 08/14/2021 12:03:12 PM See HCP within 4 Hours (or PCP triage) Yes Ottis Stain, RN, Sherrie Final Disposition 08/14/2021 12:03:12 PM See HCP within 4 Hours (or PCP triage) Yes Ottis Stain, RN, Sherrie Caller Disagree/Comply Comply Caller Understands Yes PreDisposition InappropriateToAsk Care Advice Given Per Guideline SEE HCP (OR PCP TRIAGE) WITHIN 4 HOURS: * IF OFFICE WILL BE OPEN: You need to be seen within the next 3 or 4 hours. Call your doctor (or NP/PA) now or as soon as the office opens. CALL BACK IF: * You become worse Comments User: Evlyn Clines, RN Date/Time (Eastern Time): 08/14/2021 11:49:30 AM Caller states is not with friend but will go over there if can hold for 5 min. Explained would call back in 5 mins. User: Evlyn Clines, RN Date/Time (Eastern Time): 08/14/2021 12:02:48 PM Called backline at office and spoke with Burna Sis caller needs to be seen in the next 4 hrs.. Conference callers and Tamika together to try and get appt. Explained if unable to get PCP appt then UC would be next option. Verbalized understanding. Referrals Warm transfer to backline

## 2021-08-14 NOTE — Telephone Encounter (Signed)
Pt's friend called to make an appt for sore throat. After talking about sxs she stated she has been dizzy the past couple weeks as well. Transferred to triage to go over sxs.

## 2021-08-14 NOTE — Telephone Encounter (Signed)
Appt w/ Mickel Baas today, 7/11.

## 2021-08-14 NOTE — Telephone Encounter (Signed)
Noted  

## 2021-08-15 ENCOUNTER — Ambulatory Visit: Payer: No Typology Code available for payment source | Admitting: Medical

## 2021-08-21 ENCOUNTER — Other Ambulatory Visit: Payer: Self-pay | Admitting: Family Medicine

## 2021-08-21 DIAGNOSIS — M159 Polyosteoarthritis, unspecified: Secondary | ICD-10-CM

## 2021-08-30 ENCOUNTER — Other Ambulatory Visit: Payer: Self-pay | Admitting: Family Medicine

## 2021-08-30 DIAGNOSIS — M79671 Pain in right foot: Secondary | ICD-10-CM

## 2021-09-03 ENCOUNTER — Encounter: Payer: Self-pay | Admitting: Obstetrics & Gynecology

## 2021-09-10 ENCOUNTER — Telehealth: Payer: Self-pay | Admitting: Family Medicine

## 2021-09-10 DIAGNOSIS — Z1211 Encounter for screening for malignant neoplasm of colon: Secondary | ICD-10-CM

## 2021-09-10 NOTE — Telephone Encounter (Signed)
Patient called to request a order/referral to be sent to Dr. Collene Mares, [facility], [phone]

## 2021-09-10 NOTE — Telephone Encounter (Signed)
Referral placed.

## 2021-09-10 NOTE — Telephone Encounter (Signed)
Patient called to request a order/referral to be sent to Dr. Collene Mares, [facility], [phone], for colonoscopy

## 2021-09-11 ENCOUNTER — Telehealth: Payer: Self-pay

## 2021-09-11 NOTE — Telephone Encounter (Addendum)
A refill for Prempro was called into [employer]'s automated service at [phone] for account # [id number]. Per the automated service, order # [id number] will arrive in 7-10 business days. Next refill is due 11/18/2021. A Mychart message will be sent to the patient. Karinne Schmader l Daltyn Degroat, CMA

## 2021-09-24 ENCOUNTER — Other Ambulatory Visit: Payer: Self-pay | Admitting: Family Medicine

## 2021-09-24 DIAGNOSIS — G4700 Insomnia, unspecified: Secondary | ICD-10-CM

## 2021-09-24 DIAGNOSIS — I10 Essential (primary) hypertension: Secondary | ICD-10-CM

## 2021-09-24 DIAGNOSIS — F418 Other specified anxiety disorders: Secondary | ICD-10-CM

## 2021-09-24 NOTE — Telephone Encounter (Signed)
Requesting: lorazepam 0.'5mg'$   Contract: 10/04/19 UDS: 10/05/19 Last Visit: 08/14/21 w/ Mickel Baas Next Visit: 10/30/21 Last Refill: 07/24/21 #30 and 0RF  Please Advise

## 2021-10-11 ENCOUNTER — Telehealth: Payer: Self-pay | Admitting: Family Medicine

## 2021-10-11 NOTE — Telephone Encounter (Signed)
Pt tested + for covid this morning. She declined a vv and just would like something sent in.   [place] [address], [place], [place] [address]

## 2021-10-12 ENCOUNTER — Telehealth (INDEPENDENT_AMBULATORY_CARE_PROVIDER_SITE_OTHER): Payer: No Typology Code available for payment source | Admitting: Family

## 2021-10-12 DIAGNOSIS — U071 COVID-19: Secondary | ICD-10-CM

## 2021-10-12 DIAGNOSIS — J029 Acute pharyngitis, unspecified: Secondary | ICD-10-CM

## 2021-10-12 MED ORDER — MOLNUPIRAVIR EUA 200MG CAPSULE: 4.0000 | ORAL_CAPSULE | Freq: Two times a day (BID) | ORAL | 0 refills | Status: AC

## 2021-10-12 MED ORDER — LIDOCAINE VISCOUS HCL 2 % MT SOLN: 15.0000 mL | Freq: Four times a day (QID) | OROMUCOSAL | 0 refills | Status: DC | PRN

## 2021-10-12 NOTE — Progress Notes (Signed)
Sonya [name] is a 61 y.o. female with the following history as recorded in [employer]:  Patient Active Problem List   Diagnosis Date Noted   Primary osteoarthritis of both hands 06/20/2019   Primary osteoarthritis of both knees 06/20/2019   Primary osteoarthritis of both feet 06/20/2019   Agatston coronary artery calcium score greater than 400 04/20/2018   Familial hyperlipidemia 12/26/2017   Family history of heart disease 12/26/2017   Statin intolerance 12/26/2017   Preventative health care 09/28/2017   Arthritis 09/28/2017   Essential hypertension 05/14/2016   Generalized anxiety disorder 05/14/2016    Current Outpatient Medications  Medication Sig Dispense Refill   lidocaine (XYLOCAINE) 2 % solution Use as directed 15 mLs in the mouth or throat every 6 (six) hours as needed for mouth pain. Gargle and spit 100 mL 0   molnupiravir EUA (LAGEVRIO) 200 mg CAPS capsule Take 4 capsules (800 mg total) by mouth 2 (two) times daily for 5 days. 40 capsule 0   ALPRAZolam (XANAX) 0.5 MG tablet Take 1 tablet (0.5 mg total) by mouth 3 (three) times daily as needed. 30 tablet 1   aspirin EC 81 MG tablet Take 81 mg by mouth daily. Swallow whole.     atomoxetine (STRATTERA) 25 MG capsule Take 1 capsule (25 mg total) by mouth daily. 30 capsule 1   azelastine (ASTELIN) 0.1 % nasal spray Place 2 sprays into both nostrils 2 (two) times daily. Use in each nostril as directed 30 mL 3   Bempedoic Acid (NEXLETOL) 180 MG TABS Take 1 tablet by mouth daily. 90 tablet 3   benzonatate (TESSALON) 100 MG capsule Take 1 capsule (100 mg total) by mouth 3 (three) times daily as needed. 20 capsule 0   Biotin 1000 MCG tablet Take 1,000 mcg by mouth daily.     [employer] OMEGA-3 KRILL OIL 500 MG CAPS Take 1 capsule by mouth daily.     diclofenac sodium (VOLTAREN) 1 % GEL APPLY 1 APPLICATION TOPICALLY 4 (FOUR) TIMES DAILY AS NEEDED.*NOT COVERED* 100 g 2   estradiol (ESTRACE) 0.5 MG tablet Take 1 tablet (0.5 mg total) by mouth  daily. 30 tablet 3   estrogen, conjugated,-medroxyprogesterone (PREMPRO) 0.3-1.5 MG tablet Take 1 tablet by mouth daily. 30 tablet 1   guanFACINE (TENEX) 1 MG tablet TAKE 1 TABLET BY MOUTH EVERYDAY AT BEDTIME 90 tablet 1   hydrochlorothiazide (HYDRODIURIL) 25 MG tablet Take 1 tablet (25 mg total) by mouth daily. 90 tablet 1   LORazepam (ATIVAN) 0.5 MG tablet TAKE 1 TABLET BY MOUTH TWICE A DAY AS NEEDED FOR ANXIETY 30 tablet 1   meclizine (ANTIVERT) 25 MG tablet Take 1 tablet (25 mg total) by mouth 3 (three) times daily as needed for dizziness. 30 tablet 0   meloxicam (MOBIC) 15 MG tablet TAKE 1 TABLET (15 MG TOTAL) BY MOUTH DAILY. 30 tablet 1   methocarbamol (ROBAXIN) 500 MG tablet TAKE 1 TABLET BY MOUTH FOUR TIMES A DAY 30 tablet 1   NONFORMULARY OR COMPOUNDED ITEM cbd oil  Full spectrum 1 each 0   REPATHA SURECLICK 035 MG/ML SOAJ INJECT 1 DOSE INTO THE SKIN EVERY 14 (FOURTEEN) DAYS. 2 mL 5   No current facility-administered medications for this visit.    Allergies: Atorvastatin, Livalo [pitavastatin], Praluent [alirocumab], Pravastatin, and Zetia [ezetimibe]  Past Medical History:  Diagnosis Date   Arthritis    Fibromyalgia    Hypertension     Past Surgical History:  Procedure Laterality Date   CESAREAN SECTION  80    Family History  Problem Relation Age of Onset   Arthritis Mother    Hyperlipidemia Mother    Hypertension Mother    Diabetes Mother    Cancer - Other Mother        vulvar   Aortic stenosis Mother    Lung cancer Mother    Cancer - Lung Mother    Arthritis Father    Hyperlipidemia Father    Heart failure Father        Hx disease   Hypertension Father    Stroke Father    Prostate cancer Father    Macular degeneration Father    Alzheimer's disease Father    Dementia Father    Alcohol abuse Sister    Subarachnoid hemorrhage Sister    Heart attack Brother    Stomach cancer Maternal Grandmother    Stroke Paternal Grandfather    Lung cancer Paternal  Grandfather        smoker   Healthy Son    Healthy Son     Social History   Tobacco Use   Smoking status: Never   Smokeless tobacco: Never  Substance Use Topics   Alcohol use: No    Subjective:   I connected with Caryl Asp Andres on 10/12/21 at 10:00 AM EDT by a video enabled telemedicine application and verified that I am speaking with the correct person using two identifiers.   I discussed the limitations of evaluation and management by telemedicine and the availability of in person appointments. The patient expressed understanding and agreed to proceed. Provider in office/ patient is at home; provider and patient are only 2 people on video call.   Started with sore throat/ headache/ body aches on Wednesday; tested positive for COVID this morning; no chest pain or shortness of breath; using OTC Tylenol/ Advil;    Objective:  There were no vitals filed for this visit.  General: Well developed, well nourished, in no acute distress  Head: Normocephalic and atraumatic  Lungs: Respirations unlabored;  Neurologic: Alert and oriented; speech intact; face symmetrical; moves all extremities well; CNII-XII intact without focal deficit   Assessment:  1. COVID-19   2. Acute pharyngitis, unspecified etiology     Plan:  Rx for Molnupiravir- take as directed; Rx for viscous lidocaine to help with throat pain; symptomatic treatment including Tylenol and Advil discussed; encouraged fluids, rest and follow up worse, no better.   No follow-ups on file.  No orders of the defined types were placed in this encounter.   Requested Prescriptions   Signed Prescriptions Disp Refills   molnupiravir EUA (LAGEVRIO) 200 mg CAPS capsule 40 capsule 0    Sig: Take 4 capsules (800 mg total) by mouth 2 (two) times daily for 5 days.   lidocaine (XYLOCAINE) 2 % solution 100 mL 0    Sig: Use as directed 15 mLs in the mouth or throat every 6 (six) hours as needed for mouth pain. Gargle and spit

## 2021-10-12 NOTE — Telephone Encounter (Signed)
Pt had vv today with Mickel Baas

## 2021-10-16 ENCOUNTER — Telehealth: Payer: Self-pay | Admitting: Family Medicine

## 2021-10-16 NOTE — Telephone Encounter (Signed)
Patient had a VV with Mickel Baas on 09/08 for a covid pos test, patient states she got the antiviral but Mickel Baas had told her to reach out if she was still not feeling good for antibiotics. Patient would like antibiotics sent. Please advise.

## 2021-10-17 ENCOUNTER — Other Ambulatory Visit: Payer: Self-pay | Admitting: Family

## 2021-10-17 MED ORDER — DOXYCYCLINE HYCLATE 100 MG PO TABS: 100.0000 mg | ORAL_TABLET | Freq: Two times a day (BID) | ORAL | 0 refills | Status: DC

## 2021-10-17 NOTE — Telephone Encounter (Signed)
Pt is asking for antibotics since the antivirals did not help as much. I have attempted to call pt back to gather more info. There was no answer.

## 2021-10-18 NOTE — Telephone Encounter (Signed)
I have called the pt and relayed the message from the provider and pt reports that she is feeling better.

## 2021-10-30 ENCOUNTER — Encounter: Payer: No Typology Code available for payment source | Admitting: Family Medicine

## 2021-11-14 ENCOUNTER — Other Ambulatory Visit: Payer: Self-pay | Admitting: Family Medicine

## 2021-11-14 ENCOUNTER — Telehealth: Payer: Self-pay | Admitting: Internal Medicine

## 2021-11-14 DIAGNOSIS — M79671 Pain in right foot: Secondary | ICD-10-CM

## 2021-11-14 NOTE — Telephone Encounter (Signed)
Patient stated she wanted the lab orders from her last visit with Dr. Debara Pickett. She has a new insurance and wants to combine labs with her PCP's order. Patient to the following fasting lab orders to give to her PCP (Lipids and Lipoprotein A). Patient will contact our clinic when she gets the results.

## 2021-11-14 NOTE — Telephone Encounter (Signed)
Pt called in and would like to know if she can get written orders for her lab work with DR Debara Pickett that is due, She stated her ins changed and she can not afford to come to [place] right now.    Best number [phone].  Ok to leave a message if need be.

## 2021-11-16 ENCOUNTER — Encounter: Payer: Self-pay | Admitting: Family Medicine

## 2021-11-16 ENCOUNTER — Ambulatory Visit (INDEPENDENT_AMBULATORY_CARE_PROVIDER_SITE_OTHER): Payer: No Typology Code available for payment source | Admitting: Family Medicine

## 2021-11-16 VITALS — BP 120/88 | HR 67 | Temp 97.7°F | Resp 18 | Ht 64.0 in | Wt 159.2 lb

## 2021-11-16 DIAGNOSIS — M1990 Unspecified osteoarthritis, unspecified site: Secondary | ICD-10-CM

## 2021-11-16 DIAGNOSIS — F411 Generalized anxiety disorder: Secondary | ICD-10-CM | POA: Diagnosis not present

## 2021-11-16 DIAGNOSIS — Z23 Encounter for immunization: Secondary | ICD-10-CM

## 2021-11-16 DIAGNOSIS — I10 Essential (primary) hypertension: Secondary | ICD-10-CM

## 2021-11-16 DIAGNOSIS — Z0000 Encounter for general adult medical examination without abnormal findings: Secondary | ICD-10-CM

## 2021-11-16 DIAGNOSIS — E7849 Other hyperlipidemia: Secondary | ICD-10-CM | POA: Diagnosis not present

## 2021-11-16 NOTE — Assessment & Plan Note (Signed)
Well controlled, no changes to meds. Encouraged heart healthy diet such as the DASH diet and exercise as tolerated.  °

## 2021-11-16 NOTE — Patient Instructions (Signed)
Preventive Care 40-61 Years Old, Female Preventive care refers to lifestyle choices and visits with your health care provider that can promote health and wellness. Preventive care visits are also called wellness exams. What can I expect for my preventive care visit? Counseling Your health care provider may ask you questions about your: Medical history, including: Past medical problems. Family medical history. Pregnancy history. Current health, including: Menstrual cycle. Method of birth control. Emotional well-being. Home life and relationship well-being. Sexual activity and sexual health. Lifestyle, including: Alcohol, nicotine or tobacco, and drug use. Access to firearms. Diet, exercise, and sleep habits. Work and work environment. Sunscreen use. Safety issues such as seatbelt and bike helmet use. Physical exam Your health care provider will check your: Height and weight. These may be used to calculate your BMI (body mass index). BMI is a measurement that tells if you are at a healthy weight. Waist circumference. This measures the distance around your waistline. This measurement also tells if you are at a healthy weight and may help predict your risk of certain diseases, such as type 2 diabetes and high blood pressure. Heart rate and blood pressure. Body temperature. Skin for abnormal spots. What immunizations do I need?  Vaccines are usually given at various ages, according to a schedule. Your health care provider will recommend vaccines for you based on your age, medical history, and lifestyle or other factors, such as travel or where you work. What tests do I need? Screening Your health care provider may recommend screening tests for certain conditions. This may include: Lipid and cholesterol levels. Diabetes screening. This is done by checking your blood sugar (glucose) after you have not eaten for a while (fasting). Pelvic exam and Pap test. Hepatitis B test. Hepatitis C  test. HIV (human immunodeficiency virus) test. STI (sexually transmitted infection) testing, if you are at risk. Lung cancer screening. Colorectal cancer screening. Mammogram. Talk with your health care provider about when you should start having regular mammograms. This may depend on whether you have a family history of breast cancer. BRCA-related cancer screening. This may be done if you have a family history of breast, ovarian, tubal, or peritoneal cancers. Bone density scan. This is done to screen for osteoporosis. Talk with your health care provider about your test results, treatment options, and if necessary, the need for more tests. Follow these instructions at home: Eating and drinking  Eat a diet that includes fresh fruits and vegetables, whole grains, lean protein, and low-fat dairy products. Take vitamin and mineral supplements as recommended by your health care provider. Do not drink alcohol if: Your health care provider tells you not to drink. You are pregnant, may be pregnant, or are planning to become pregnant. If you drink alcohol: Limit how much you have to 0-1 drink a day. Know how much alcohol is in your drink. In the [place] one drink equals one 12 oz bottle of beer (355 mL), one 5 oz glass of wine (148 mL), or one 1 oz glass of hard liquor (44 mL). Lifestyle Brush your teeth every morning and night with fluoride toothpaste. Floss one time each day. Exercise for at least 30 minutes 5 or more days each week. Do not use any products that contain nicotine or tobacco. These products include cigarettes, chewing tobacco, and vaping devices, such as e-cigarettes. If you need help quitting, ask your health care provider. Do not use drugs. If you are sexually active, practice safe sex. Use a condom or other form of protection to   prevent STIs. If you do not wish to become pregnant, use a form of birth control. If you plan to become pregnant, see your health care provider for a  prepregnancy visit. Take aspirin only as told by your health care provider. Make sure that you understand how much to take and what form to take. Work with your health care provider to find out whether it is safe and beneficial for you to take aspirin daily. Find healthy ways to manage stress, such as: Meditation, yoga, or listening to music. Journaling. Talking to a trusted person. Spending time with friends and family. Minimize exposure to UV radiation to reduce your risk of skin cancer. Safety Always wear your seat belt while driving or riding in a vehicle. Do not drive: If you have been drinking alcohol. Do not ride with someone who has been drinking. When you are tired or distracted. While texting. If you have been using any mind-altering substances or drugs. Wear a helmet and other protective equipment during sports activities. If you have firearms in your house, make sure you follow all gun safety procedures. Seek help if you have been physically or sexually abused. What's next? Visit your health care provider once a year for an annual wellness visit. Ask your health care provider how often you should have your eyes and teeth checked. Stay up to date on all vaccines. This information is not intended to replace advice given to you by your health care provider. Make sure you discuss any questions you have with your health care provider. Document Revised: 07/19/2020 Document Reviewed: 07/19/2020 Elsevier Patient Education  [place].

## 2021-11-16 NOTE — Assessment & Plan Note (Signed)
stable °

## 2021-11-16 NOTE — Assessment & Plan Note (Signed)
ghm utd Check labs  See avs  

## 2021-11-16 NOTE — Assessment & Plan Note (Signed)
Per lipid clinic 

## 2021-11-16 NOTE — Progress Notes (Signed)
Subjective:   By signing my name below, I, Roma Schanz, attest that this documentation has been prepared under the direction and in the presence of Roma Schanz, 11/16/2021.    Patient ID: Sonya [name], female    DOB: [birth year], 61 y.o.   MRN: [id number]  Chief Complaint  Patient presents with   Annual Exam    Pt states fasting    HPI Patient is in today for a comprehensive physical exam.  She denies new moles, itching, chills, fever, hearing loss, sinus pain, congestion, sore throat, cough and hemoptysis, chest pain, palpitations, wheezing, constipation, diarrhea, blood in stool, nausea and vomiting, dysuria, frequency, hematuria, myalgias and joint pain, depression, anxiety.  Pain- She is complaining of pain in the calves when she is up a lot and is also complaining about pain in hands. Reports that it may be due to bone spurs. Advil, Tylenol, Icy hot, and creams do not help to relieve symptoms.  Neuropathy- Patient complains of pinching nerves and states that 500 mg Robaxin does not relieve symptoms.  OB/GYN- Patient regularly sees her OB/GYN, Ms.Smith Family history- She reports of no new family history. Immunizations- She is requesting an influenza and Tetanus vaccine this visit. Pap smear last completed on 05/30/2021. Mammogram last completed on 4/1//2023. Vision- She is UTD on vision care. Dental-She is UTD on dental care. Colonoscopy- She reports that she has an upcoming appointment for a colonoscopy in Oct 2023.  Past Medical History:  Diagnosis Date   Arthritis    Fibromyalgia    Hypertension     Past Surgical History:  Procedure Laterality Date   CESAREAN SECTION  74    Family History  Problem Relation Age of Onset   Arthritis Mother    Hyperlipidemia Mother    Hypertension Mother    Diabetes Mother    Cancer - Other Mother        vulvar   Aortic stenosis Mother    Lung cancer Mother    Cancer - Lung Mother    Arthritis Father     Hyperlipidemia Father    Heart failure Father        Hx disease   Hypertension Father    Stroke Father    Prostate cancer Father    Macular degeneration Father    Alzheimer's disease Father    Dementia Father    Alcohol abuse Sister    Subarachnoid hemorrhage Sister    Heart attack Brother    Stomach cancer Maternal Grandmother    Stroke Paternal Grandfather    Lung cancer Paternal Grandfather        smoker   Healthy Son    Healthy Son     Social History   Socioeconomic History   Marital status: Legally Separated    Spouse name: Not on file   Number of children: Not on file   Years of education: Not on file   Highest education level: Not on file  Occupational History   Occupation: Emergency planning/management officer  Tobacco Use   Smoking status: Never   Smokeless tobacco: Never  Vaping Use   Vaping Use: Never used  Substance and Sexual Activity   Alcohol use: No   Drug use: No   Sexual activity: Not Currently    Birth control/protection: None  Other Topics Concern   Not on file  Social History Narrative   Walks occasionally---  has 2 little dogs   Social Determinants of Health   Financial Resource Strain: Not  on file  Food Insecurity: Not on file  Transportation Needs: Not on file  Physical Activity: Not on file  Stress: Not on file  Social Connections: Not on file  Intimate Partner Violence: Not on file    Outpatient Medications Prior to Visit  Medication Sig Dispense Refill   ALPRAZolam (XANAX) 0.5 MG tablet Take 1 tablet (0.5 mg total) by mouth 3 (three) times daily as needed. 30 tablet 1   aspirin EC 81 MG tablet Take 81 mg by mouth daily. Swallow whole.     atomoxetine (STRATTERA) 25 MG capsule Take 1 capsule (25 mg total) by mouth daily. 30 capsule 1   azelastine (ASTELIN) 0.1 % nasal spray Place 2 sprays into both nostrils 2 (two) times daily. Use in each nostril as directed 30 mL 3   Bempedoic Acid (NEXLETOL) 180 MG TABS Take 1 tablet by mouth daily. 90 tablet 3    Biotin 1000 MCG tablet Take 1,000 mcg by mouth daily.     [employer]S OMEGA-3 KRILL OIL 500 MG CAPS Take 1 capsule by mouth daily.     estradiol (ESTRACE) 0.5 MG tablet Take 1 tablet (0.5 mg total) by mouth daily. 30 tablet 3   estrogen, conjugated,-medroxyprogesterone (PREMPRO) 0.3-1.5 MG tablet Take 1 tablet by mouth daily. 30 tablet 1   guanFACINE (TENEX) 1 MG tablet TAKE 1 TABLET BY MOUTH EVERYDAY AT BEDTIME 90 tablet 1   hydrochlorothiazide (HYDRODIURIL) 25 MG tablet Take 1 tablet (25 mg total) by mouth daily. 90 tablet 1   lidocaine (XYLOCAINE) 2 % solution Use as directed 15 mLs in the mouth or throat every 6 (six) hours as needed for mouth pain. Gargle and spit 100 mL 0   LORazepam (ATIVAN) 0.5 MG tablet TAKE 1 TABLET BY MOUTH TWICE A DAY AS NEEDED FOR ANXIETY 30 tablet 1   meclizine (ANTIVERT) 25 MG tablet Take 1 tablet (25 mg total) by mouth 3 (three) times daily as needed for dizziness. 30 tablet 0   NONFORMULARY OR COMPOUNDED ITEM cbd oil  Full spectrum 1 each 0   REPATHA SURECLICK 144 MG/ML SOAJ INJECT 1 DOSE INTO THE SKIN EVERY 14 (FOURTEEN) DAYS. 2 mL 5   benzonatate (TESSALON) 100 MG capsule Take 1 capsule (100 mg total) by mouth 3 (three) times daily as needed. 20 capsule 0   diclofenac sodium (VOLTAREN) 1 % GEL APPLY 1 APPLICATION TOPICALLY 4 (FOUR) TIMES DAILY AS NEEDED.*NOT COVERED* 100 g 2   doxycycline (VIBRA-TABS) 100 MG tablet Take 1 tablet (100 mg total) by mouth 2 (two) times daily. 14 tablet 0   meloxicam (MOBIC) 15 MG tablet TAKE 1 TABLET (15 MG TOTAL) BY MOUTH DAILY. 30 tablet 1   methocarbamol (ROBAXIN) 500 MG tablet TAKE 1 TABLET BY MOUTH FOUR TIMES A DAY 30 tablet 1   No facility-administered medications prior to visit.    Allergies  Allergen Reactions   Atorvastatin Other (See Comments)    Leg cramps   Livalo [Pitavastatin] Other (See Comments)    Flu like symptoms - 2 mg   Praluent [Alirocumab]     Joint pain   Pravastatin Other (See Comments)    40 mg -myalgias    Zetia [Ezetimibe] Other (See Comments)    Muscle Aches    Review of Systems  Constitutional:  Negative for chills and fever.  HENT:  Negative for congestion, sinus pain and sore throat.   Respiratory:  Negative for cough, hemoptysis and wheezing.   Cardiovascular:  Negative for chest pain  and palpitations.  Gastrointestinal:  Negative for blood in stool, constipation, diarrhea, nausea and vomiting.  Genitourinary:  Negative for dysuria, frequency and hematuria.  Musculoskeletal:  Negative for joint pain and myalgias.  Skin:  Negative for itching.       (-) new moles  Psychiatric/Behavioral:  Negative for depression. The patient is not nervous/anxious.        Objective:    Physical Exam Constitutional:      General: She is not in acute distress.    Appearance: Normal appearance. She is not ill-appearing.  HENT:     Head: Normocephalic and atraumatic.     Right Ear: Tympanic membrane, ear canal and external ear normal.     Left Ear: Tympanic membrane, ear canal and external ear normal.  Eyes:     Extraocular Movements: Extraocular movements intact.     Pupils: Pupils are equal, round, and reactive to light.  Cardiovascular:     Rate and Rhythm: Normal rate and regular rhythm.     Heart sounds: Normal heart sounds. No murmur heard.    No gallop.  Pulmonary:     Effort: Pulmonary effort is normal. No respiratory distress.     Breath sounds: Normal breath sounds. No wheezing or rales.  Abdominal:     General: Bowel sounds are normal. There is no distension.     Palpations: Abdomen is soft.     Tenderness: There is no abdominal tenderness. There is no guarding.  Skin:    General: Skin is warm and dry.  Neurological:     Mental Status: She is alert and oriented to person, place, and time.  Psychiatric:        Judgment: Judgment normal.     BP 120/88 (BP Location: Left Arm, Patient Position: Sitting, Cuff Size: Normal)   Pulse 67   Temp 97.7 F (36.5 C) (Oral)   Resp  18   Ht '5\' 4"'$  (1.626 m)   Wt 159 lb 3.2 oz (72.2 kg)   LMP 02/23/2016   SpO2 97%   BMI 27.33 kg/m  Wt Readings from Last 3 Encounters:  11/16/21 159 lb 3.2 oz (72.2 kg)  08/14/21 161 lb (73 kg)  07/11/21 162 lb (73.5 kg)    Diabetic Foot Exam - Simple   No data filed    Lab Results  Component Value Date   WBC 4.3 10/27/2020   HGB 14.6 10/27/2020   HCT 43.8 10/27/2020   PLT 184 10/27/2020   GLUCOSE 82 10/27/2020   CHOL 230 (H) 10/27/2020   TRIG 68 10/27/2020   HDL 75 10/27/2020   LDLCALC 139 (H) 10/27/2020   ALT 18 10/27/2020   AST 21 10/27/2020   NA 140 10/27/2020   K 3.3 (L) 10/27/2020   CL 102 10/27/2020   CREATININE 0.73 10/27/2020   BUN 15 10/27/2020   CO2 29 10/27/2020   TSH 1.25 10/27/2020    Lab Results  Component Value Date   TSH 1.25 10/27/2020   Lab Results  Component Value Date   WBC 4.3 10/27/2020   HGB 14.6 10/27/2020   HCT 43.8 10/27/2020   MCV 90.5 10/27/2020   PLT 184 10/27/2020   Lab Results  Component Value Date   NA 140 10/27/2020   K 3.3 (L) 10/27/2020   CO2 29 10/27/2020   GLUCOSE 82 10/27/2020   BUN 15 10/27/2020   CREATININE 0.73 10/27/2020   BILITOT 0.8 10/27/2020   ALKPHOS 68 09/28/2018   AST 21 10/27/2020  ALT 18 10/27/2020   PROT 6.2 10/27/2020   ALBUMIN 4.5 09/28/2018   CALCIUM 9.6 10/27/2020   GFR 87.48 09/28/2018   Lab Results  Component Value Date   CHOL 230 (H) 10/27/2020   Lab Results  Component Value Date   HDL 75 10/27/2020   Lab Results  Component Value Date   LDLCALC 139 (H) 10/27/2020   Lab Results  Component Value Date   TRIG 68 10/27/2020   Lab Results  Component Value Date   CHOLHDL 3.1 10/27/2020   No results found for: "HGBA1C"     Assessment & Plan:   Problem List Items Addressed This Visit       Unprioritized   Preventative health care - Primary    ghm utd Check labs  See avs      Relevant Orders   CBC with Differential/Platelet   Comprehensive metabolic panel    Lipid panel   TSH   Lipoprotein A (LPA)   Generalized anxiety disorder    stable      Familial hyperlipidemia    Per lipid clinic      Relevant Orders   CBC with Differential/Platelet   Comprehensive metabolic panel   Lipid panel   TSH   Lipoprotein A (LPA)   Essential hypertension    Well controlled, no changes to meds. Encouraged heart healthy diet such as the DASH diet and exercise as tolerated.       Relevant Orders   CBC with Differential/Platelet   Comprehensive metabolic panel   Lipid panel   TSH   Lipoprotein A (LPA)   Arthritis    Stable       Other Visit Diagnoses     Need for influenza vaccination       Relevant Orders   Flu Vaccine QUAD 15moIM (Fluarix, Fluzone & Alfiuria Quad PF) (Completed)   Need for Tdap vaccination       Relevant Orders   Tdap vaccine greater than or equal to 7yo IM (Completed)       No orders of the defined types were placed in this encounter.   I, LRoma Schanz personally preformed the services described in this documentation.  All medical record entries made by the scribe were at my direction and in my presence.  I have reviewed the chart and discharge instructions (if applicable) and agree that the record reflects my personal performance and is accurate and complete. 11/16/2021.  I,Chester Romero R Lowne Chase,acting as a scribe for [employer] DO.,have documented all relevant documentation on the behalf of YAnn Held DO,as directed by  YAnn Held DO while in the presence of YAnn Held DO.    YAnn Held DO

## 2021-11-16 NOTE — Assessment & Plan Note (Signed)
Stable

## 2021-11-19 ENCOUNTER — Other Ambulatory Visit: Payer: Self-pay | Admitting: Internal Medicine

## 2021-11-19 ENCOUNTER — Other Ambulatory Visit: Payer: Self-pay | Admitting: Family Medicine

## 2021-11-19 DIAGNOSIS — M159 Polyosteoarthritis, unspecified: Secondary | ICD-10-CM

## 2021-11-19 NOTE — Telephone Encounter (Signed)
Refill request for tramadol. Med no longer on med list. Please advise?

## 2021-11-20 LAB — CBC WITH DIFFERENTIAL/PLATELET
Absolute Monocytes: 308 cells/uL (ref 200–950)
Basophils Absolute: 70 cells/uL (ref 0–200)
Basophils Relative: 1.6 %
Eosinophils Absolute: 88 cells/uL (ref 15–500)
Eosinophils Relative: 2 %
HCT: 39.7 % (ref 35.0–45.0)
Hemoglobin: 13.4 g/dL (ref 11.7–15.5)
Lymphs Abs: 1355 cells/uL (ref 850–3900)
MCH: 30.4 pg (ref 27.0–33.0)
MCHC: 33.8 g/dL (ref 32.0–36.0)
MCV: 90 fL (ref 80.0–100.0)
MPV: 11.2 fL (ref 7.5–12.5)
Monocytes Relative: 7 %
Neutro Abs: 2578 cells/uL (ref 1500–7800)
Neutrophils Relative %: 58.6 %
Platelets: 206 10*3/uL (ref 140–400)
RBC: 4.41 10*6/uL (ref 3.80–5.10)
RDW: 12.6 % (ref 11.0–15.0)
Total Lymphocyte: 30.8 %
WBC: 4.4 10*3/uL (ref 3.8–10.8)

## 2021-11-20 LAB — LIPID PANEL
Cholesterol: 193 mg/dL (ref <200)
HDL: 75 mg/dL (ref >=50)
LDL Cholesterol (Calc): 104 mg/dL (calc) — ABNORMAL HIGH
Non-HDL Cholesterol (Calc): 118 mg/dL (calc) (ref <130)
Total CHOL/HDL Ratio: 2.6 (calc) (ref <5.0)
Triglycerides: 55 mg/dL (ref <150)

## 2021-11-20 LAB — COMPREHENSIVE METABOLIC PANEL
AG Ratio: 1.8 (calc) (ref 1.0–2.5)
ALT: 17 U/L (ref 6–29)
AST: 21 U/L (ref 10–35)
Albumin: 4.2 g/dL (ref 3.6–5.1)
Alkaline phosphatase (APISO): 42 U/L (ref 37–153)
BUN: 12 mg/dL (ref 7–25)
CO2: 27 mmol/L (ref 20–32)
Calcium: 9.5 mg/dL (ref 8.6–10.4)
Chloride: 102 mmol/L (ref 98–110)
Creat: 0.76 mg/dL (ref 0.50–1.05)
Globulin: 2.3 g/dL (calc) (ref 1.9–3.7)
Glucose, Bld: 82 mg/dL (ref 65–99)
Potassium: 3.6 mmol/L (ref 3.5–5.3)
Sodium: 141 mmol/L (ref 135–146)
Total Bilirubin: 0.6 mg/dL (ref 0.2–1.2)
Total Protein: 6.5 g/dL (ref 6.1–8.1)

## 2021-11-20 LAB — TSH: TSH: 1.57 mIU/L (ref 0.40–4.50)

## 2021-11-20 LAB — LIPOPROTEIN A (LPA): Lipoprotein (a): 452 nmol/L — ABNORMAL HIGH (ref <75)

## 2021-11-22 ENCOUNTER — Telehealth: Payer: Self-pay

## 2021-11-22 NOTE — Telephone Encounter (Signed)
Noted. Labs in epic. Will route to MD to review.  Thanks!

## 2021-11-22 NOTE — Telephone Encounter (Signed)
-----   Message from Maurine Minister, [place] sent at 11/22/2021 11:04 AM EDT ----- Regarding: Meds Rx Requesting meds from [facility].  Wants it before it runs out this year.

## 2021-11-22 NOTE — Telephone Encounter (Signed)
Sent message to patient in [place]

## 2021-11-22 NOTE — Telephone Encounter (Signed)
Patient is calling back to say that she did get her labs done at her PCP. Please advise

## 2021-11-22 NOTE — Telephone Encounter (Signed)
Patient's refill for Prempro was ordered through the automated service at [phone] for account number [id number]. The medication is due to arrive in 7-10 business days for order # [id number]. The next refill is due 02/01/22. Patient will be notified. Amanada Philbrick l Lupie Sawa, CMA

## 2021-11-22 NOTE — Telephone Encounter (Signed)
Pixie Casino, MD  You 21 minutes ago (1:59 PM)    LDL is lower, but LP(a) is very high. Already on Repatha.   Dr Lemmie Evens

## 2021-12-03 LAB — HM COLONOSCOPY

## 2021-12-07 ENCOUNTER — Telehealth: Payer: Self-pay | Admitting: Family Medicine

## 2021-12-07 NOTE — Telephone Encounter (Signed)
Pt states she had given pcp a dmv handicap placard to be filled outat her cpe, but when she took it to the dmv it had Cecille Rubin Pesqueira's name on it and dmv did not accept it. Heather and kim made aware. Nira Conn stated pt will be called once paperwork has been filled out correctly.

## 2021-12-11 NOTE — Telephone Encounter (Signed)
Spoke with patient. Advised that correct placard was ready for pick up.

## 2021-12-25 ENCOUNTER — Other Ambulatory Visit: Payer: Self-pay | Admitting: Family Medicine

## 2021-12-25 DIAGNOSIS — M79671 Pain in right foot: Secondary | ICD-10-CM

## 2021-12-31 ENCOUNTER — Other Ambulatory Visit: Payer: Self-pay | Admitting: Family Medicine

## 2021-12-31 DIAGNOSIS — G4700 Insomnia, unspecified: Secondary | ICD-10-CM

## 2021-12-31 DIAGNOSIS — F418 Other specified anxiety disorders: Secondary | ICD-10-CM

## 2022-01-01 NOTE — Telephone Encounter (Signed)
Requesting: lorazepam 0.'5mg'$   Contract: 10/04/19 UDS: 10/05/19 Last Visit: 11/16/21 Next Visit: 11/19/22 Last Refill: 09/24/21 #30 and 1RF  Please Advise

## 2022-01-16 ENCOUNTER — Telehealth: Payer: Self-pay | Admitting: Family Medicine

## 2022-01-16 NOTE — Telephone Encounter (Signed)
Patient said she received a bill from [facility] and they said that the diagnosis code for CPT [phone] Lipoprotein needs to be reviewed so that it can be rebilled to [facility].

## 2022-01-17 NOTE — Telephone Encounter (Signed)
Reviewed ICD 10 codes submitted and they were pertinent to the test ordered (lipoprotein A). Spoke with Macky Lower at [facility] and advised her we do not have any additional dx codes to provide.  I also checked with our billing coder regarding [employer]'s policy for testing and found that [place] deems this test as "experimental and investigational because their clinical value has not been established". Notified pt of above finding. She asks that we request an appeal. I explained that it is likely not eligible. Pt states her cardiologist is the one who wanted her to have this testing and they have done appeals for her in the past and gotten other testing covered for her.  I advised her that I do not know what testing that has been or the circumstances surrounding previous testing. I have advised pt to contact her insurance company and find out if "investigational/experimental" testing is eligible for an appeal and to call us back if it is eligible and we will send to PCP for letter of medical necessity.

## 2022-01-23 ENCOUNTER — Other Ambulatory Visit: Payer: Self-pay | Admitting: Internal Medicine

## 2022-01-23 ENCOUNTER — Telehealth: Payer: Self-pay | Admitting: Internal Medicine

## 2022-01-23 NOTE — Telephone Encounter (Signed)
Called patient, made aware that samples of Nexletol 180 mg available for pick up.   Patient verbalized understanding

## 2022-01-23 NOTE — Telephone Encounter (Signed)
Patient calling the office for samples of medication:   1.  What medication and dosage are you requesting samples for?   NEXLETOL 180 MG TABS    2.  Are you currently out of this medication? Only has a few left. Pt states her pharmacy is having to order them and she is almost out.

## 2022-01-23 NOTE — Telephone Encounter (Signed)
PA for nexletol submitted via CMM Approved until 01/22/23  Patient has new insurance - BC[employer] ID: [id number] [place]: [id number]

## 2022-01-24 ENCOUNTER — Other Ambulatory Visit: Payer: Self-pay | Admitting: Family Medicine

## 2022-01-24 DIAGNOSIS — Z1231 Encounter for screening mammogram for malignant neoplasm of breast: Secondary | ICD-10-CM

## 2022-01-25 ENCOUNTER — Other Ambulatory Visit: Payer: Self-pay | Admitting: Family Medicine

## 2022-01-25 DIAGNOSIS — M79671 Pain in right foot: Secondary | ICD-10-CM

## 2022-02-24 ENCOUNTER — Other Ambulatory Visit: Payer: Self-pay | Admitting: Family Medicine

## 2022-02-24 DIAGNOSIS — G4700 Insomnia, unspecified: Secondary | ICD-10-CM

## 2022-02-24 DIAGNOSIS — M79671 Pain in right foot: Secondary | ICD-10-CM

## 2022-02-24 DIAGNOSIS — F418 Other specified anxiety disorders: Secondary | ICD-10-CM

## 2022-02-25 NOTE — Telephone Encounter (Signed)
Requesting: lorazepam 0.'5mg'$   Contract: 10/04/19 UDS: 10/05/19 Last Visit: 11/16/21 Next Visit:  11/19/22 Last Refill:01/01/22 #30 and 1RF   Please Advise

## 2022-03-05 ENCOUNTER — Other Ambulatory Visit: Payer: Self-pay | Admitting: Family Medicine

## 2022-03-05 DIAGNOSIS — I10 Essential (primary) hypertension: Secondary | ICD-10-CM

## 2022-03-30 ENCOUNTER — Other Ambulatory Visit: Payer: Self-pay | Admitting: Family Medicine

## 2022-03-30 ENCOUNTER — Other Ambulatory Visit: Payer: Self-pay | Admitting: Internal Medicine

## 2022-03-30 DIAGNOSIS — R41840 Attention and concentration deficit: Secondary | ICD-10-CM

## 2022-03-30 DIAGNOSIS — M159 Polyosteoarthritis, unspecified: Secondary | ICD-10-CM

## 2022-04-01 NOTE — Telephone Encounter (Signed)
Requesting:Tramadol Contract: 2021 UDS: 2021 Last OV: 11/16/21 Next OV: 11/19/2022 Last Refill: 11/24/2021, #15--1 RF Database:   Please advise

## 2022-04-16 ENCOUNTER — Other Ambulatory Visit: Payer: Self-pay | Admitting: Family Medicine

## 2022-04-16 DIAGNOSIS — R41840 Attention and concentration deficit: Secondary | ICD-10-CM

## 2022-04-25 ENCOUNTER — Telehealth: Payer: Self-pay

## 2022-04-25 NOTE — Telephone Encounter (Signed)
[facility] at [phone] to refill patient's Prempro. Per Lattie Haw at [facility], the patient's enrollment expired on 02/03/22 and the patient will need to re enroll. Patient's ID# is [id number]. Patient will be contacted. Kenon Delashmit l [name], CMA

## 2022-04-28 ENCOUNTER — Other Ambulatory Visit: Payer: Self-pay | Admitting: Family Medicine

## 2022-04-28 DIAGNOSIS — M79671 Pain in right foot: Secondary | ICD-10-CM

## 2022-04-29 ENCOUNTER — Other Ambulatory Visit: Payer: Self-pay | Admitting: Family Medicine

## 2022-04-29 DIAGNOSIS — F418 Other specified anxiety disorders: Secondary | ICD-10-CM

## 2022-04-29 DIAGNOSIS — G4700 Insomnia, unspecified: Secondary | ICD-10-CM

## 2022-04-29 NOTE — Telephone Encounter (Signed)
Requesting: lorazepam 0.5mg   Contract: 10/04/19 UDS: 10/05/19 Last Visit: 11/16/21 Next Visit: 11/19/22 Last Refill: 02/25/22 #30 and 1RF   Please Advise

## 2022-05-17 ENCOUNTER — Inpatient Hospital Stay: Admission: RE | Admit: 2022-05-17 | Payer: No Typology Code available for payment source | Source: Ambulatory Visit

## 2022-05-29 ENCOUNTER — Other Ambulatory Visit: Payer: Self-pay | Admitting: Internal Medicine

## 2022-05-29 DIAGNOSIS — Z8249 Family history of ischemic heart disease and other diseases of the circulatory system: Secondary | ICD-10-CM

## 2022-05-29 DIAGNOSIS — R931 Abnormal findings on diagnostic imaging of heart and coronary circulation: Secondary | ICD-10-CM

## 2022-05-29 DIAGNOSIS — E7849 Other hyperlipidemia: Secondary | ICD-10-CM

## 2022-05-29 NOTE — Telephone Encounter (Signed)
Rx(s) sent to pharmacy electronically.  

## 2022-06-13 ENCOUNTER — Other Ambulatory Visit: Payer: Self-pay | Admitting: Internal Medicine

## 2022-06-13 ENCOUNTER — Telehealth: Payer: Self-pay | Admitting: Family Medicine

## 2022-06-13 ENCOUNTER — Other Ambulatory Visit: Payer: Self-pay | Admitting: Family Medicine

## 2022-06-13 DIAGNOSIS — T753XXA Motion sickness, initial encounter: Secondary | ICD-10-CM

## 2022-06-13 DIAGNOSIS — I10 Essential (primary) hypertension: Secondary | ICD-10-CM

## 2022-06-13 MED ORDER — SCOPOLAMINE 1 MG/3DAYS TD PT72
1.0000 | MEDICATED_PATCH | TRANSDERMAL | 12 refills | Status: AC
Start: 2022-06-13 — End: ?

## 2022-06-13 NOTE — Telephone Encounter (Signed)
Pt called to request seasickness patches for a cruise she is going on in the next couple weeks. Pt was seen for a CPE on 10.13.23. Rx would like to be sent to:   [id number], [place] - [address], [place] [address] Phone: [phone]  Fax: [phone] DEA #:

## 2022-06-26 ENCOUNTER — Ambulatory Visit
Admission: RE | Admit: 2022-06-26 | Discharge: 2022-06-26 | Disposition: A | Discharge status: Home / Self Care | Payer: No Typology Code available for payment source | Source: Ambulatory Visit | Attending: Family Medicine | Admitting: Family Medicine

## 2022-06-26 DIAGNOSIS — Z1231 Encounter for screening mammogram for malignant neoplasm of breast: Secondary | ICD-10-CM | POA: Diagnosis not present

## 2022-07-04 ENCOUNTER — Other Ambulatory Visit: Payer: Self-pay | Admitting: Family Medicine

## 2022-07-04 DIAGNOSIS — G4700 Insomnia, unspecified: Secondary | ICD-10-CM

## 2022-07-04 DIAGNOSIS — M79671 Pain in right foot: Secondary | ICD-10-CM

## 2022-07-04 DIAGNOSIS — F418 Other specified anxiety disorders: Secondary | ICD-10-CM

## 2022-07-05 NOTE — Telephone Encounter (Signed)
Patient is requesting a refill of the following medications: Requested Prescriptions   Pending Prescriptions Disp Refills   LORazepam (ATIVAN) 0.5 MG tablet [Pharmacy Med Name: LORAZEPAM 0.5 MG TABLET] 30 tablet 1    Sig: TAKE 1 TABLET BY MOUTH TWICE A DAY AS NEEDED FOR ANXIETY   methocarbamol (ROBAXIN) 500 MG tablet [Pharmacy Med Name: METHOCARBAMOL 500 MG TABLET] 30 tablet 1    Sig: TAKE 1 TABLET BY MOUTH FOUR TIMES A DAY    Date of patient request: 07/05/2022 Last office visit: 11/16/2021 Date of last refill: 04/29/2022 Last refill amount: 30

## 2022-07-11 ENCOUNTER — Telehealth: Payer: Self-pay

## 2022-07-11 ENCOUNTER — Other Ambulatory Visit (HOSPITAL_COMMUNITY): Payer: Self-pay

## 2022-07-11 NOTE — Telephone Encounter (Signed)
Pharmacy Patient Advocate Encounter   Received notification from RN that prior authorization for REPATHA 140MG /ML is required/requested.   PA submitted to [employer]    Status is pending

## 2022-07-14 IMAGING — MG MM DIGITAL SCREENING BILAT W/ TOMO AND CAD
8 series · 8 of 24 positions shown · non-contrast
Comparison: Previous exam(s).

CLINICAL DATA: Screening.

EXAM:
DIGITAL SCREENING BILATERAL MAMMOGRAM WITH TOMOSYNTHESIS AND CAD
TECHNIQUE: Bilateral screening digital craniocaudal and mediolateral oblique
mammograms were obtained. Bilateral screening digital breast
tomosynthesis was performed. The images were evaluated with
computer-aided detection.

[L MLO synth-2D]
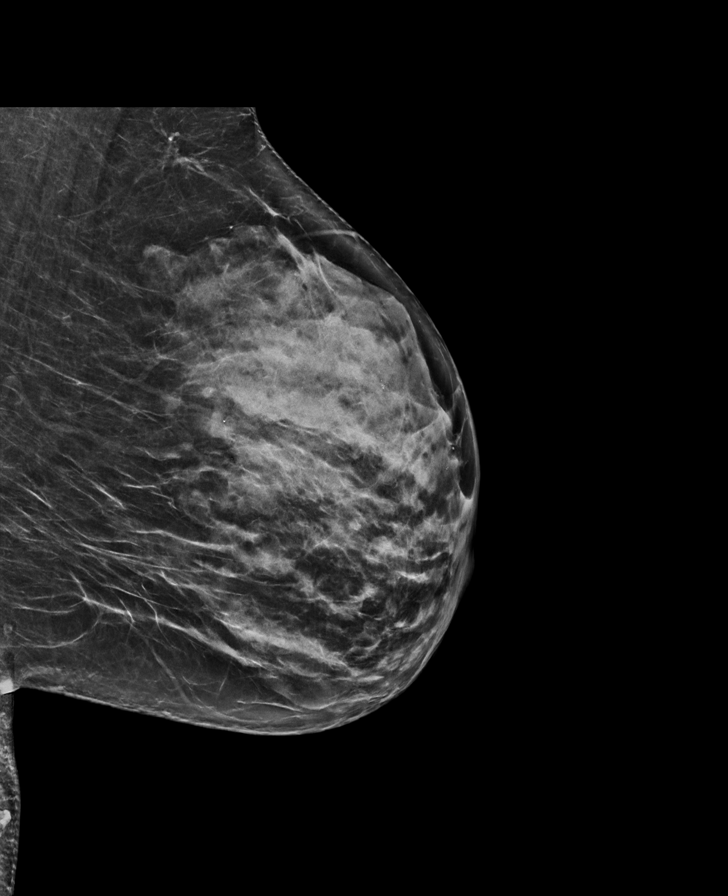

[R CC synth-2D]
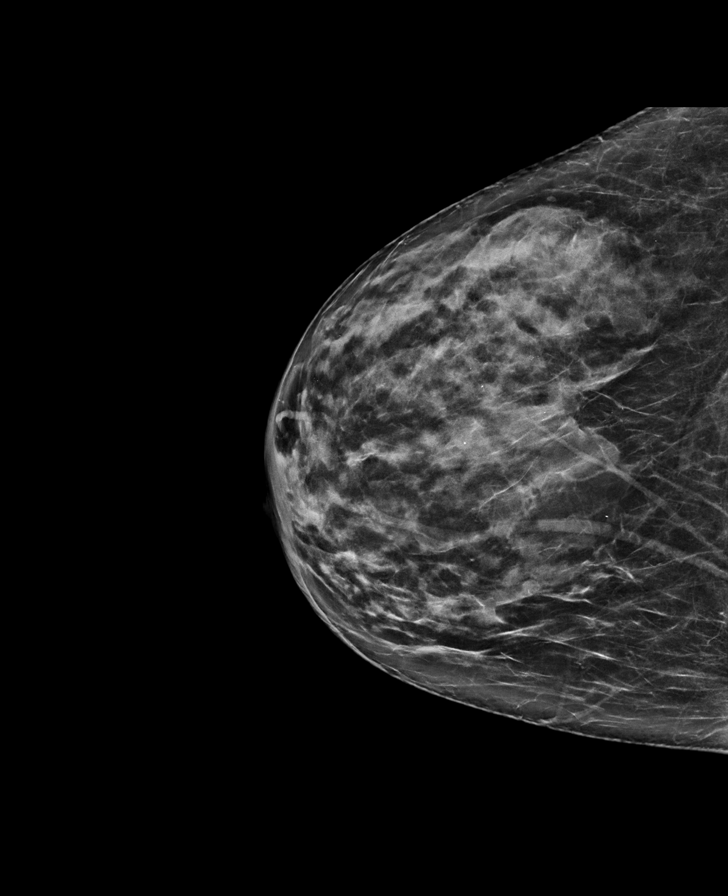

[L CC synth-2D]
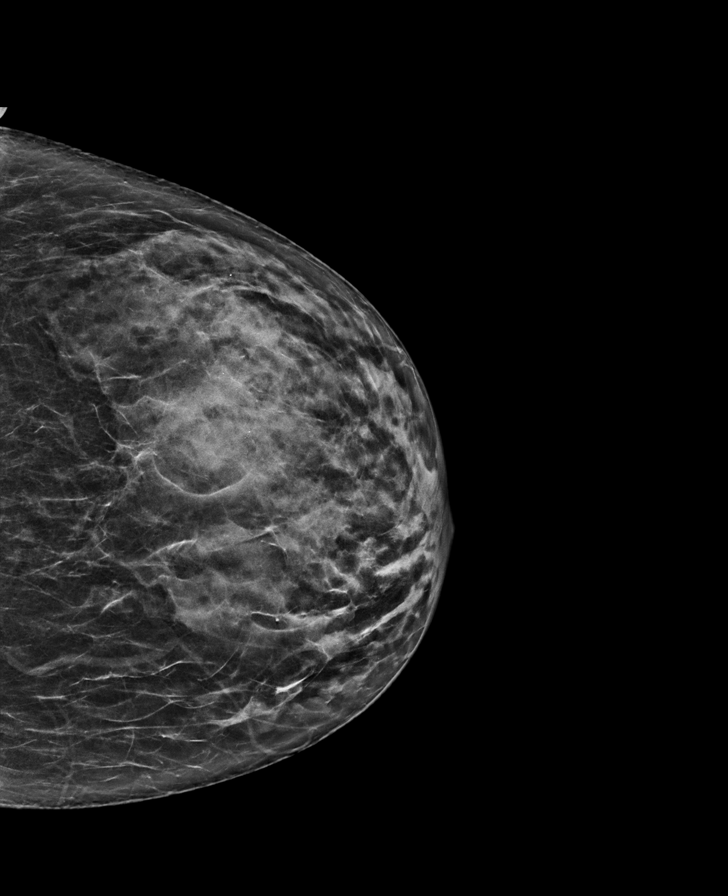

[R MLO synth-2D]
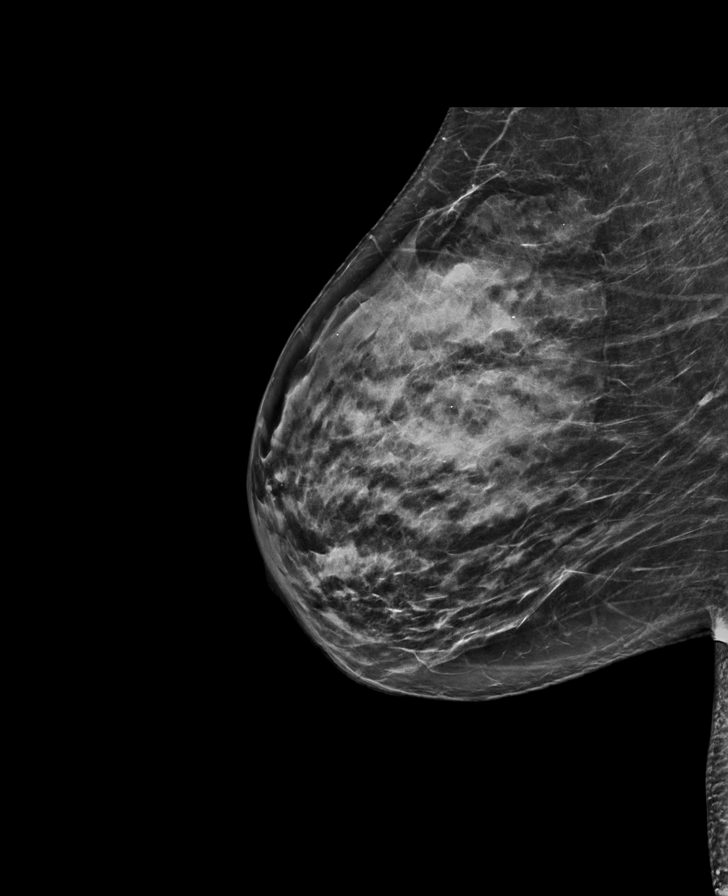

[R CC tomo · tomo slice 35/68.0]
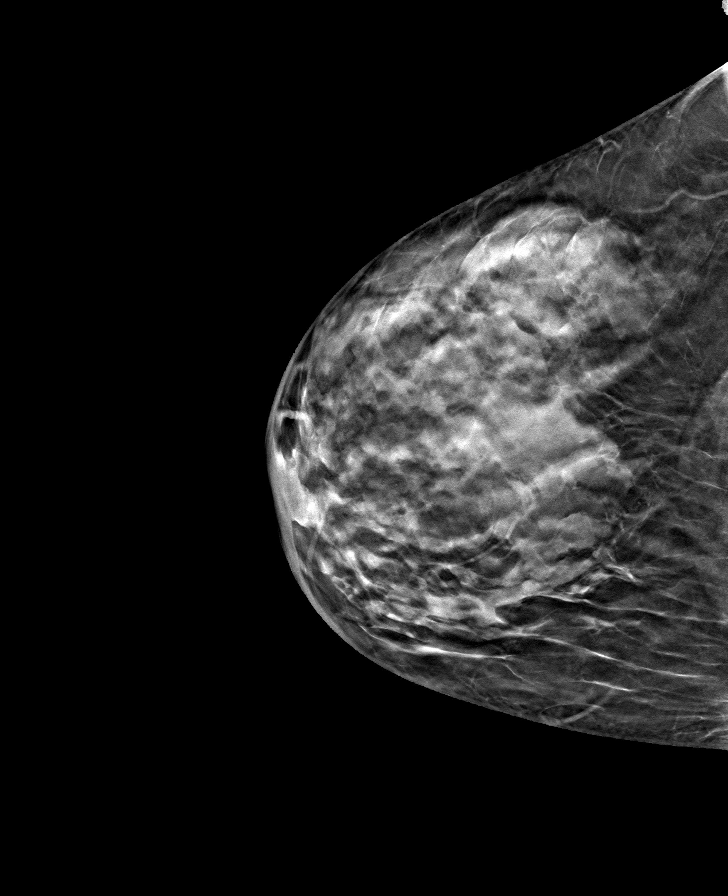

[L CC tomo · tomo slice 31/62.0]
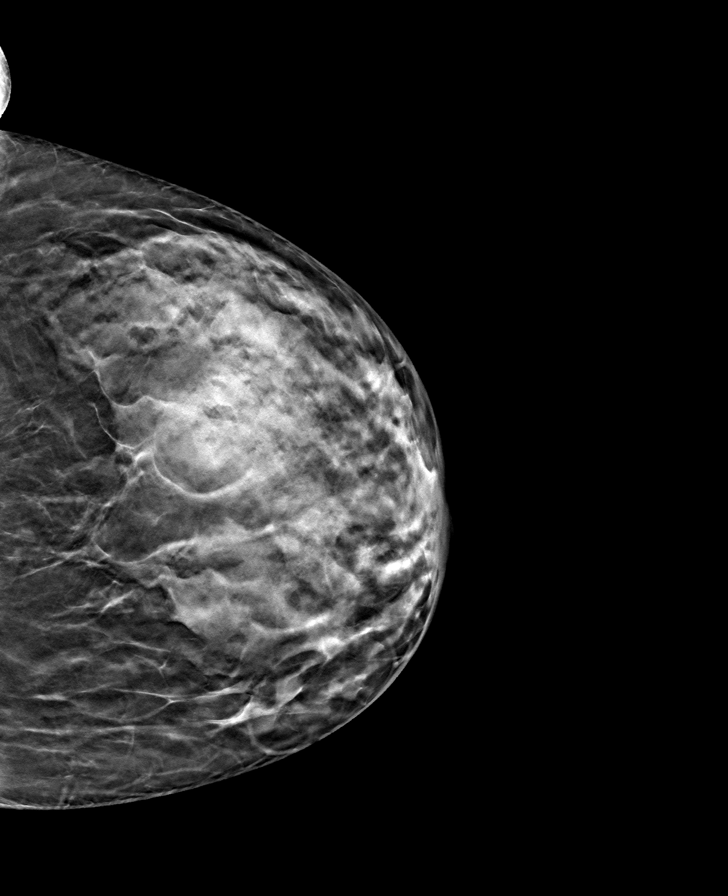

[L MLO tomo · tomo slice 35/69.0]
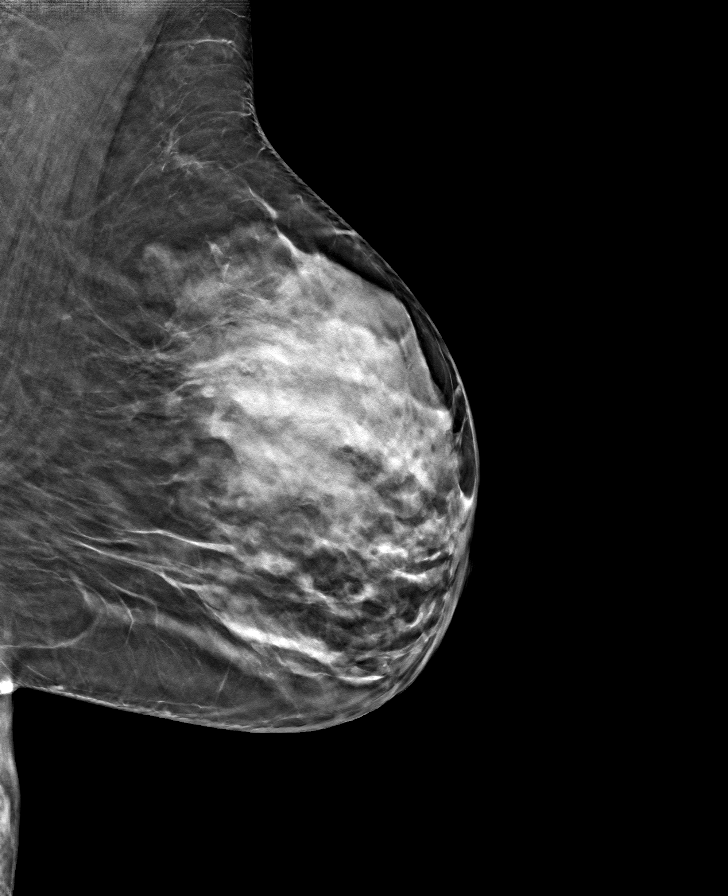

[R MLO tomo · tomo slice 35/70.0]
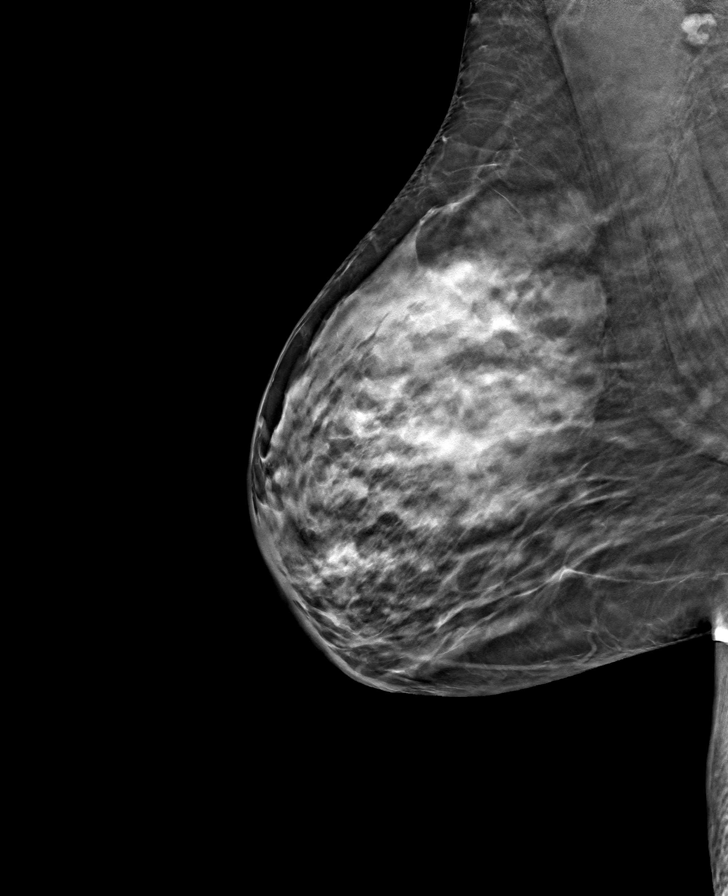

[8 of 24 positions shown; findings below may reference images not displayed]

ACR Breast Density Category d: The breast tissue is extremely dense,
which lowers the sensitivity of mammography
FINDINGS: There are no findings suspicious for malignancy.
IMPRESSION: No mammographic evidence of malignancy. A result letter of this
screening mammogram will be mailed directly to the patient.

RECOMMENDATION:
Screening mammogram in one year. (Code:TA-V-WV9)

BI-RADS CATEGORY  1: Negative.

## 2022-07-16 ENCOUNTER — Other Ambulatory Visit (HOSPITAL_COMMUNITY): Payer: Self-pay

## 2022-07-16 NOTE — Telephone Encounter (Signed)
Update:   Addt information has been requested for the pt plan. That information has been completed and faxed back. The determination is pending

## 2022-07-22 NOTE — Telephone Encounter (Signed)
Pharmacy Patient Advocate Encounter  Prior Authorization for Repatha has been APPROVED by [employer] from 6.6.24 to 6.6.25.

## 2022-07-28 ENCOUNTER — Other Ambulatory Visit: Payer: Self-pay | Admitting: Family Medicine

## 2022-07-29 ENCOUNTER — Other Ambulatory Visit: Payer: Self-pay | Admitting: Family Medicine

## 2022-07-29 DIAGNOSIS — M159 Polyosteoarthritis, unspecified: Secondary | ICD-10-CM

## 2022-07-29 NOTE — Telephone Encounter (Signed)
Requesting: tramadol 50mg   Contract: 10/04/19 UDS: 10/05/19 Last Visit: 11/16/21 Next Visit: 11/19/22 Last Refill:  04/01/22 #15 and 1RF   Please Advise

## 2022-07-31 DIAGNOSIS — R931 Abnormal findings on diagnostic imaging of heart and coronary circulation: Secondary | ICD-10-CM

## 2022-07-31 DIAGNOSIS — E7849 Other hyperlipidemia: Secondary | ICD-10-CM

## 2022-07-31 DIAGNOSIS — Z8249 Family history of ischemic heart disease and other diseases of the circulatory system: Secondary | ICD-10-CM

## 2022-08-09 ENCOUNTER — Encounter: Payer: Self-pay | Admitting: Family Medicine

## 2022-08-09 ENCOUNTER — Encounter: Payer: Self-pay | Admitting: Urology

## 2022-08-09 ENCOUNTER — Encounter: Payer: Self-pay | Admitting: Internal Medicine

## 2022-08-12 MED ORDER — NEXLETOL 180 MG PO TABS: 1.0000 | ORAL_TABLET | Freq: Every day | ORAL | 0 refills | Status: DC

## 2022-08-12 MED ORDER — REPATHA SURECLICK 140 MG/ML ~~LOC~~ SOAJ
140.0000 mg | SUBCUTANEOUS | 0 refills | Status: DC
Start: 2022-08-12 — End: 2022-09-26

## 2022-08-12 NOTE — Telephone Encounter (Signed)
Spoke with patient about her refills. She is at pharmacy now. She said she called our office 2 weeks ago and some female representative said they were texting with me Hydrographic surveyor) about her refills. Informed her there is no documentation about this but that I did send her a message about 2 weeks ago with co-pay card info and also asked if there was anything else she needed, to which she responded to today (in this message thread). She said she needs a 90 day supply of her meds (repatha, nexletol) in order to use co-pay card. Explained that both were last refilled for only 104-month supply as she is overdue for a visit and it was noted on the med instruction sig that she needed an appointment. She said she did not get this notification and today is the 1st day she was aware she needed a visit. She said she has no problem scheduling an appointment so I offered her 7/24 and she said responded that she can't scheduled right now [facility] and [facility] appointment [facility]. Advised I will send a MyChart message with appointment options for this month (7/24, 7/30) and she can reply directly back to me to be scheduled. [facility] requested.

## 2022-08-14 ENCOUNTER — Telehealth: Payer: Self-pay

## 2022-08-14 ENCOUNTER — Other Ambulatory Visit: Payer: Self-pay | Admitting: Obstetrics & Gynecology

## 2022-08-14 NOTE — Telephone Encounter (Signed)
Patient is no longer a part of the pzifier and therefore is no longer getting prescription for Prempro.  Patient has annual schedule on 08-28-22.  Patient would like what Dr. Erin Fulling prescribed 4 years ago- not Prempro but two prescriptions that are of similar makeup of prempro.  Patient doesn't want to wait until her annual appointment because she is not sleeping and having PMP symptoms.  Will route message to provider for input on this. Armandina Stammer RN

## 2022-08-16 ENCOUNTER — Other Ambulatory Visit: Payer: Self-pay | Admitting: Obstetrics & Gynecology

## 2022-08-16 DIAGNOSIS — N951 Menopausal and female climacteric states: Secondary | ICD-10-CM

## 2022-08-16 MED ORDER — ESTRADIOL 0.5 MG PO TABS: 0.5000 mg | ORAL_TABLET | Freq: Every day | ORAL | 3 refills | Status: DC

## 2022-08-16 MED ORDER — MEDROXYPROGESTERONE ACETATE 2.5 MG PO TABS
2.5000 mg | ORAL_TABLET | Freq: Every day | ORAL | 3 refills | Status: DC
Start: 2022-08-16 — End: 2022-10-23

## 2022-08-19 NOTE — Telephone Encounter (Signed)
Left message that script has been sent to her pharmacy. Armandina Stammer RN

## 2022-08-28 ENCOUNTER — Ambulatory Visit (INDEPENDENT_AMBULATORY_CARE_PROVIDER_SITE_OTHER): Payer: BC Managed Care – PPO | Admitting: Obstetrics & Gynecology

## 2022-08-28 ENCOUNTER — Other Ambulatory Visit (HOSPITAL_COMMUNITY)
Admission: RE | Admit: 2022-08-28 | Discharge: 2022-08-28 | Disposition: A | Discharge status: Home / Self Care | Payer: BC Managed Care – PPO | Source: Ambulatory Visit | Attending: Obstetrics & Gynecology | Admitting: Obstetrics & Gynecology

## 2022-08-28 ENCOUNTER — Other Ambulatory Visit: Payer: Self-pay | Admitting: Family Medicine

## 2022-08-28 ENCOUNTER — Encounter: Payer: Self-pay | Admitting: Obstetrics & Gynecology

## 2022-08-28 VITALS — BP 133/91 | HR 71 | Wt 165.0 lb

## 2022-08-28 DIAGNOSIS — R87610 Atypical squamous cells of undetermined significance on cytologic smear of cervix (ASC-US): Secondary | ICD-10-CM | POA: Insufficient documentation

## 2022-08-28 DIAGNOSIS — S80819S Abrasion, unspecified lower leg, sequela: Secondary | ICD-10-CM

## 2022-08-28 DIAGNOSIS — G4700 Insomnia, unspecified: Secondary | ICD-10-CM

## 2022-08-28 DIAGNOSIS — F418 Other specified anxiety disorders: Secondary | ICD-10-CM

## 2022-08-28 DIAGNOSIS — Z01419 Encounter for gynecological examination (general) (routine) without abnormal findings: Secondary | ICD-10-CM | POA: Diagnosis not present

## 2022-08-28 DIAGNOSIS — M79672 Pain in left foot: Secondary | ICD-10-CM

## 2022-08-28 NOTE — Telephone Encounter (Signed)
Requesting: lorazepam 0.5mg   Contract: 10/04/19 UDS: 10/05/19 Last Visit: 11/16/21 Next Visit 11/19/22 Last Refill: 07/05/22 #30 and 1RF  Please Advise
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
## 2022-08-30 ENCOUNTER — Telehealth: Payer: Self-pay | Admitting: Internal Medicine

## 2022-08-30 NOTE — Telephone Encounter (Signed)
New Message:    Patient would like to change from Dr Rennis Golden to Dr Cristal Deer. Is this alright with you?

## 2022-08-30 NOTE — Telephone Encounter (Signed)
Pt c/o BP issue: STAT if pt c/o blurred vision, one-sided weakness or slurred speech  1. What are your last 5 BP readings? 139/90 this morning, 129/106 yesterday 125/84, 148/84 144/94   2. Are you having any other symptoms (ex. Dizziness, headache, blurred vision, passed out)?  Dizziness and floaters in her eyes  3. What is your BP issue? Blood pressure been running high for the past 3 weeks

## 2022-08-30 NOTE — Telephone Encounter (Signed)
Spoke to patient . RN reassured patient the numbers are mildly elevated but not in dangerous range.. RN also informed patient she can reach out to her primary for blood pressure issues since Dr Rennis Golden is mainly following Lipids .    Informed patient to check blood pressure for next 2 weeks  and send  information for review. Patient states sh has been checking blood pressure daily and can do a screen shot and send information.patient verbalized understanding.   Patient aware will send  message to Dr Rennis Golden for review.

## 2022-08-31 NOTE — Telephone Encounter (Signed)
OK by me

## 2022-09-23 ENCOUNTER — Encounter (HOSPITAL_BASED_OUTPATIENT_CLINIC_OR_DEPARTMENT_OTHER): Payer: Self-pay | Admitting: Cardiology

## 2022-09-23 ENCOUNTER — Ambulatory Visit (INDEPENDENT_AMBULATORY_CARE_PROVIDER_SITE_OTHER): Payer: BC Managed Care – PPO | Admitting: Cardiology

## 2022-09-23 VITALS — BP 126/86 | HR 52 | Ht 64.0 in | Wt 164.1 lb

## 2022-09-23 DIAGNOSIS — E7841 Elevated Lipoprotein(a): Secondary | ICD-10-CM

## 2022-09-23 DIAGNOSIS — Z789 Other specified health status: Secondary | ICD-10-CM

## 2022-09-23 DIAGNOSIS — Z8249 Family history of ischemic heart disease and other diseases of the circulatory system: Secondary | ICD-10-CM | POA: Diagnosis not present

## 2022-09-23 DIAGNOSIS — E7849 Other hyperlipidemia: Secondary | ICD-10-CM

## 2022-09-23 DIAGNOSIS — I2510 Atherosclerotic heart disease of native coronary artery without angina pectoris: Secondary | ICD-10-CM

## 2022-09-23 NOTE — Patient Instructions (Signed)

## 2022-09-23 NOTE — Progress Notes (Signed)
Cardiology Office Note:  .    Date:  09/23/2022  ID:  Sonya [name], DOB November 05, 1960, MRN [id number] PCP: Zola Button, Grayling Congress, DO  [facility] Providers Cardiologist:  Jodelle Red, MD     History of Present Illness: .    Sonya [name] is a 61 y.o. female with a hx of hypertension, hyperlipidemia, fibromyalgia, arthritis, here for follow-up and to establish care with me. Previously followed by Dr. Rennis Golden, last seen by him 12/04/2020. At that visit she was on Repatha but lipids remained uncontrolled; they initiated bempedoic acid 180 mg daily.   Cardiovascular risk factors: Prior clinical ASCVD: None. 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 Rennis Golden. Last lipid panel 11/2021 with HDL 75, triglycerides 55, LDL 104. LPA 11/2021 was elevated at 452. Diagnosed in her 20's; prior intolerance of atorvastatin, pravastatin, Livalo, and Zetia. Currently on Repatha and bempedoic acid 180 mg which she is tolerating. Hypertension - her blood pressure is 126/86 in the office today; also notes being under significant stress lately sometimes associated with chest discomfort. Occasionally feels dizzy, thought to be sinus related. Metabolic syndrome/Obesity:  Highest adult weight 207 lbs about 7 years ago. Current weight 164 lbs. She successfully lost weight with Weight Watchers. Chronic inflammatory conditions:  Arthritis (negative markers for RA). Tobacco use history: Former smoker when she was a teenager. None since. Family history: Her mother had aortic stenosis. Her father had coronary artery disease and congestive heart failure. Her brother died of an MI at age 106.  Prior pertinent testing and/or incidental findings: Coronary CT 01/2018 showed multivessel coronary calcification with a calcium score of 633. Exercise level: Walks her dogs 3 times a day. Performing water aerobics routinely. Mostly limited by foot pain. She has occasional numbness in  her feet usually when sitting or lying down. Stays active at home, routine yard work.  Current diet: Tries to eat healthier, Weight Watchers. Notes that she mostly struggled with caloric intake. Rarely has processed food.  She denies any palpitations, shortness of breath, peripheral edema, headaches, syncope, orthopnea, or PND.  ROS:  Please see the history of present illness. ROS otherwise negative except as noted.  (+) Chest discomfort with stress (+) Occasional dizziness (+) Bilateral foot pain and numbness  Studies Reviewed: Marland Kitchen    EKG Interpretation Date/Time:  Monday September 23 2022 10:04:08 EDT Ventricular Rate:  52 PR Interval:  172 QRS Duration:  92 QT Interval:  446 QTC Calculation: 414 R Axis:   41  Text Interpretation: Sinus bradycardia No previous ECGs available Confirmed by Jodelle Red [phone]) on 09/23/2022 10:27:00 AM     Physical Exam:    VS:  BP 126/86 (BP Location: Left Arm, Patient Position: Sitting, Cuff Size: Normal)   Pulse (!) 52   Ht 5\' 4"  (1.626 m)   Wt 164 lb 1.6 oz (74.4 kg)   LMP 02/23/2016   BMI 28.17 kg/m    Wt Readings from Last 3 Encounters:  09/23/22 164 lb 1.6 oz (74.4 kg)  08/28/22 165 lb (74.8 kg)  11/16/21 159 lb 3.2 oz (72.2 kg)    GEN: Well nourished, well developed in no acute distress HEENT: Normal, moist mucous membranes NECK: No JVD CARDIAC: regular rhythm, normal S1 and S2, no rubs or gallops. No murmur. VASCULAR: Radial and DP pulses 2+ bilaterally. No carotid bruits RESPIRATORY:  Clear to auscultation without rales, wheezing or rhonchi  ABDOMEN: Soft, non-tender, non-distended MUSCULOSKELETAL:  Ambulates independently SKIN: Warm and dry,  no edema NEUROLOGIC:  Alert and oriented x 3. No focal neuro deficits noted. PSYCHIATRIC:  Normal affect   ASSESSMENT AND PLAN: .    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 Limit salt. -recommend moderate walking, 3-5 times/week for 30-50 minutes each session. Aim for at least 150 minutes.week. Goal should be pace of 3 miles/hours, or walking 1.5 miles in 30 minutes -recommend avoidance of tobacco products. Avoid excess alcohol. -ASCVD risk score: The 10-year ASCVD risk score (Arnett DK, et al., 2019) is: 3.8%   Values used to calculate the score:     Age: 26 years     Sex: Female     Is Non-Hispanic African American: No     Diabetic: No     Tobacco smoker: No     Systolic Blood Pressure: 126 mmHg     Is BP treated: Yes     HDL Cholesterol: 75 mg/dL     Total Cholesterol: 193 mg/dL    Dispo: Follow-up in 1 year, or sooner as needed.  I,Mathew Stumpf,acting as a Neurosurgeon for [employer], MD.,have documented all relevant documentation on the behalf of Jodelle Red, MD,as directed by  Jodelle Red, MD while in the presence of Jodelle Red, MD.  I, Jodelle Red, MD, have reviewed all documentation for this visit. The documentation on 09/23/22 for the exam, diagnosis, procedures, and orders are all accurate and complete.   Signed, Jodelle Red, MD

## 2022-09-25 ENCOUNTER — Other Ambulatory Visit: Payer: Self-pay | Admitting: Internal Medicine

## 2022-09-25 ENCOUNTER — Other Ambulatory Visit: Payer: Self-pay | Admitting: Family Medicine

## 2022-09-25 DIAGNOSIS — R931 Abnormal findings on diagnostic imaging of heart and coronary circulation: Secondary | ICD-10-CM

## 2022-09-25 DIAGNOSIS — Z8249 Family history of ischemic heart disease and other diseases of the circulatory system: Secondary | ICD-10-CM

## 2022-09-25 DIAGNOSIS — E7849 Other hyperlipidemia: Secondary | ICD-10-CM

## 2022-09-25 DIAGNOSIS — M159 Polyosteoarthritis, unspecified: Secondary | ICD-10-CM

## 2022-09-25 NOTE — Telephone Encounter (Signed)
Requesting: tramadol Contract: 2021 UDS: 2021 Last OV: 11/16/21 Next OV: 11/19/2022 Last Refill: 07/30/22, #15--1 RF Database:   Please advise

## 2022-10-23 ENCOUNTER — Other Ambulatory Visit: Payer: Self-pay

## 2022-10-23 DIAGNOSIS — N951 Menopausal and female climacteric states: Secondary | ICD-10-CM

## 2022-10-23 MED ORDER — ESTRADIOL 0.5 MG PO TABS
0.5000 mg | ORAL_TABLET | Freq: Every day | ORAL | 3 refills | Status: DC
Start: 2022-10-23 — End: 2023-05-05

## 2022-10-23 MED ORDER — MEDROXYPROGESTERONE ACETATE 2.5 MG PO TABS
2.5000 mg | ORAL_TABLET | Freq: Every day | ORAL | 3 refills | Status: DC
Start: 2022-10-23 — End: 2023-12-16

## 2022-10-23 NOTE — Progress Notes (Signed)
Requested a 90 day supply from her pharmacy. Armandina Stammer RN

## 2022-10-31 ENCOUNTER — Other Ambulatory Visit: Payer: Self-pay | Admitting: Family Medicine

## 2022-10-31 DIAGNOSIS — F418 Other specified anxiety disorders: Secondary | ICD-10-CM

## 2022-10-31 DIAGNOSIS — M79672 Pain in left foot: Secondary | ICD-10-CM

## 2022-10-31 DIAGNOSIS — G4700 Insomnia, unspecified: Secondary | ICD-10-CM

## 2022-11-01 NOTE — Telephone Encounter (Signed)
Requesting: Ativan Contract: 2021 UDS: 2021 Last OV: 11/16/2021 Next OV: 11/19/2022 Last Refill: 08/28/2022, #30--1 RF Database:   Please advise

## 2022-11-19 ENCOUNTER — Encounter: Payer: Self-pay | Admitting: Family Medicine

## 2022-11-19 ENCOUNTER — Telehealth: Payer: Self-pay | Admitting: Family Medicine

## 2022-11-19 ENCOUNTER — Ambulatory Visit: Payer: BC Managed Care – PPO | Admitting: Family Medicine

## 2022-11-19 VITALS — BP 118/86 | HR 58 | Temp 97.9°F | Resp 18 | Ht 64.0 in | Wt 159.2 lb

## 2022-11-19 DIAGNOSIS — I10 Essential (primary) hypertension: Secondary | ICD-10-CM

## 2022-11-19 DIAGNOSIS — R41840 Attention and concentration deficit: Secondary | ICD-10-CM | POA: Diagnosis not present

## 2022-11-19 DIAGNOSIS — Z79899 Other long term (current) drug therapy: Secondary | ICD-10-CM

## 2022-11-19 DIAGNOSIS — Z23 Encounter for immunization: Secondary | ICD-10-CM | POA: Diagnosis not present

## 2022-11-19 DIAGNOSIS — Z0000 Encounter for general adult medical examination without abnormal findings: Secondary | ICD-10-CM | POA: Diagnosis not present

## 2022-11-19 DIAGNOSIS — F418 Other specified anxiety disorders: Secondary | ICD-10-CM

## 2022-11-19 DIAGNOSIS — G4700 Insomnia, unspecified: Secondary | ICD-10-CM

## 2022-11-19 DIAGNOSIS — M150 Primary generalized (osteo)arthritis: Secondary | ICD-10-CM

## 2022-11-19 LAB — COMPREHENSIVE METABOLIC PANEL
ALT: 13 U/L (ref 0–35)
AST: 18 U/L (ref 0–37)
Albumin: 4.3 g/dL (ref 3.5–5.2)
Alkaline Phosphatase: 37 U/L — ABNORMAL LOW (ref 39–117)
BUN: 17 mg/dL (ref 6–23)
CO2: 32 meq/L (ref 19–32)
Calcium: 9.2 mg/dL (ref 8.4–10.5)
Chloride: 100 meq/L (ref 96–112)
Creatinine, Ser: 0.7 mg/dL (ref 0.40–1.20)
GFR: 93.09 mL/min (ref >60.00)
Glucose, Bld: 97 mg/dL (ref 70–99)
Potassium: 3.8 meq/L (ref 3.5–5.1)
Sodium: 139 meq/L (ref 135–145)
Total Bilirubin: 0.6 mg/dL (ref 0.2–1.2)
Total Protein: 6.3 g/dL (ref 6.0–8.3)

## 2022-11-19 LAB — CBC WITH DIFFERENTIAL/PLATELET
Basophils Absolute: 0 10*3/uL (ref 0.0–0.1)
Basophils Relative: 1.2 % (ref 0.0–3.0)
Eosinophils Absolute: 0.1 10*3/uL (ref 0.0–0.7)
Eosinophils Relative: 2.3 % (ref 0.0–5.0)
HCT: 40.8 % (ref 36.0–46.0)
Hemoglobin: 13.4 g/dL (ref 12.0–15.0)
Lymphocytes Relative: 23.1 % (ref 12.0–46.0)
Lymphs Abs: 0.9 10*3/uL (ref 0.7–4.0)
MCHC: 32.8 g/dL (ref 30.0–36.0)
MCV: 90.3 fL (ref 78.0–100.0)
Monocytes Absolute: 0.2 10*3/uL (ref 0.1–1.0)
Monocytes Relative: 6.5 % (ref 3.0–12.0)
Neutro Abs: 2.5 10*3/uL (ref 1.4–7.7)
Neutrophils Relative %: 66.9 % (ref 43.0–77.0)
Platelets: 197 10*3/uL (ref 150.0–400.0)
RBC: 4.52 Mil/uL (ref 3.87–5.11)
RDW: 13.4 % (ref 11.5–15.5)
WBC: 3.8 10*3/uL — ABNORMAL LOW (ref 4.0–10.5)

## 2022-11-19 LAB — LIPID PANEL
Cholesterol: 178 mg/dL (ref 0–200)
HDL: 66.7 mg/dL (ref >39.00)
LDL Cholesterol: 97 mg/dL (ref 0–99)
NonHDL: 111.61
Total CHOL/HDL Ratio: 3
Triglycerides: 71 mg/dL (ref 0.0–149.0)
VLDL: 14.2 mg/dL (ref 0.0–40.0)

## 2022-11-19 LAB — TSH: TSH: 1.48 u[IU]/mL (ref 0.35–5.50)

## 2022-11-19 MED ORDER — HYDROCODONE-ACETAMINOPHEN 5-325 MG PO TABS: 1.0000 | ORAL_TABLET | Freq: Four times a day (QID) | ORAL | 0 refills | Status: DC | PRN

## 2022-11-19 MED ORDER — LORAZEPAM 0.5 MG PO TABS
0.5000 mg | ORAL_TABLET | Freq: Four times a day (QID) | ORAL | 1 refills | Status: DC | PRN
Start: 2022-11-19 — End: 2023-10-08

## 2022-11-19 MED ORDER — HYDROCHLOROTHIAZIDE 25 MG PO TABS
25.0000 mg | ORAL_TABLET | Freq: Every day | ORAL | 1 refills | Status: DC
Start: 2022-11-19 — End: 2023-04-16

## 2022-11-19 MED ORDER — ATOMOXETINE HCL 25 MG PO CAPS: 25.0000 mg | ORAL_CAPSULE | Freq: Every day | ORAL | 1 refills | Status: DC

## 2022-11-19 NOTE — Telephone Encounter (Signed)
Pt dropped of scan results for the PCP to see. Form placed in PCP's tray in front office.

## 2022-11-19 NOTE — Progress Notes (Signed)
Established Patient Office Visit  Subjective   Patient ID: Sonya [name], female    DOB: 03-21-1960  Age: 62 y.o. MRN: [id number]  Chief Complaint  Patient presents with   Annual Exam    Pt states fasting     HPI Discussed the use of AI scribe software for clinical note transcription with the patient, who gave verbal consent to proceed.  History of Present Illness   The patient, with a history of arthritis and high cholesterol, presents for a routine physical examination. She reports chronic arthritis that sometimes results in immobility of the feet. The patient also mentions a high calcium score from a previous test in 2019, and a high Lipo A protein level, for which she is on Repatha. She has a family history of hyperlipidemia, which she believes is the cause of her high cholesterol levels.  The patient also reports chronic pain, which she believes is due to her arthritis. She mentions that the current pain medication, Tramadol, is not effective. The patient also reports sleep disturbances, which she attributes to the chronic pain. She has been using Lorazepam to help with sleep.  The patient also mentions a recent separation from a long-term partner, which has resulted in emotional distress. She is currently not working and is concerned about her insurance coverage.      Patient Active Problem List   Diagnosis Date Noted   Primary osteoarthritis of both hands 06/20/2019   Primary osteoarthritis of both knees 06/20/2019   Primary osteoarthritis of both feet 06/20/2019   Agatston coronary artery calcium score greater than 400 04/20/2018   Familial hyperlipidemia 12/26/2017   Family history of heart disease 12/26/2017   Statin intolerance 12/26/2017   Preventative health care 09/28/2017   Arthritis 09/28/2017   Essential hypertension 05/14/2016   Generalized anxiety disorder 05/14/2016   Past Medical History:  Diagnosis Date   Arthritis    Fibromyalgia    Hypertension     Past Surgical History:  Procedure Laterality Date   CESAREAN SECTION  1987   Social History   Tobacco Use   Smoking status: Never   Smokeless tobacco: Never  Vaping Use   Vaping status: Never Used  Substance Use Topics   Alcohol use: No   Drug use: No   Social History   Socioeconomic History   Marital status: Legally Separated    Spouse name: Not on file   Number of children: Not on file   Years of education: Not on file   Highest education level: Not on file  Occupational History   Occupation: Producer, television/film/video  Tobacco Use   Smoking status: Never   Smokeless tobacco: Never  Vaping Use   Vaping status: Never Used  Substance and Sexual Activity   Alcohol use: No   Drug use: No   Sexual activity: Not Currently    Birth control/protection: None  Other Topics Concern   Not on file  Social History Narrative   Walks occasionally---  has 2 little dogs   Social Determinants of Corporate investment banker Strain: Not on file  Food Insecurity: Not on file  Transportation Needs: Not on file  Physical Activity: Not on file  Stress: Not on file  Social Connections: Not on file  Intimate Partner Violence: Not on file   Family Status  Relation Name Status   Mother  Deceased at age 37   Father  Deceased at age 70   Sister  Deceased   Brother  Deceased  at age 94   MGM  (Not Specified)   PGF  (Not Specified)   Son  Alive   Son  Alive  No partnership data on file   Family History  Problem Relation Age of Onset   Arthritis Mother    Hyperlipidemia Mother    Hypertension Mother    Diabetes Mother    Cancer - Other Mother        vulvar   Aortic stenosis Mother    Lung cancer Mother    Cancer - Lung Mother    Arthritis Father    Hyperlipidemia Father    Heart failure Father        Hx disease   Hypertension Father    Stroke Father    Prostate cancer Father    Macular degeneration Father    Alzheimer's disease Father    Dementia Father    Alcohol abuse Sister     Subarachnoid hemorrhage Sister    Heart attack Brother    Stomach cancer Maternal Grandmother    Stroke Paternal Grandfather    Lung cancer Paternal Grandfather        smoker   Healthy Son    Healthy Son    Allergies  Allergen Reactions   Atorvastatin Other (See Comments)    Leg cramps   Livalo [Pitavastatin] Other (See Comments)    Flu like symptoms - 2 mg   Praluent [Alirocumab]     Joint pain   Pravastatin Other (See Comments)    40 mg -myalgias   Zetia [Ezetimibe] Other (See Comments)    Muscle Aches      Review of Systems  Constitutional:  Negative for chills, fever and malaise/fatigue.  HENT:  Negative for congestion and hearing loss.   Eyes:  Negative for blurred vision and discharge.  Respiratory:  Negative for cough, sputum production and shortness of breath.   Cardiovascular:  Negative for chest pain, palpitations and leg swelling.  Gastrointestinal:  Negative for abdominal pain, blood in stool, constipation, diarrhea, heartburn, nausea and vomiting.  Genitourinary:  Negative for dysuria, frequency, hematuria and urgency.  Musculoskeletal:  Negative for back pain, falls and myalgias.  Skin:  Negative for rash.  Neurological:  Negative for dizziness, sensory change, loss of consciousness, weakness and headaches.  Endo/Heme/Allergies:  Negative for environmental allergies. Does not bruise/bleed easily.  Psychiatric/Behavioral:  Negative for depression and suicidal ideas. The patient is not nervous/anxious and does not have insomnia.       Objective:     BP 118/86 (BP Location: Left Arm, Patient Position: Sitting, Cuff Size: Normal)   Pulse (!) 58   Temp 97.9 F (36.6 C) (Oral)   Resp 18   Ht 5\' 4"  (1.626 m)   Wt 159 lb 3.2 oz (72.2 kg)   LMP 02/23/2016   SpO2 99%   BMI 27.33 kg/m  BP Readings from Last 3 Encounters:  11/19/22 118/86  09/23/22 126/86  08/28/22 (!) 133/91   Wt Readings from Last 3 Encounters:  11/19/22 159 lb 3.2 oz (72.2 kg)   09/23/22 164 lb 1.6 oz (74.4 kg)  08/28/22 165 lb (74.8 kg)   SpO2 Readings from Last 3 Encounters:  11/19/22 99%  11/16/21 97%  08/14/21 95%      Physical Exam Vitals and nursing note reviewed.  Constitutional:      General: She is not in acute distress.    Appearance: Normal appearance. She is well-developed.  HENT:     Head: Normocephalic and atraumatic.  Right Ear: Tympanic membrane, ear canal and external ear normal. There is no impacted cerumen.     Left Ear: Tympanic membrane, ear canal and external ear normal. There is no impacted cerumen.     Nose: Nose normal.     Mouth/Throat:     Mouth: Mucous membranes are moist.     Pharynx: Oropharynx is clear. No oropharyngeal exudate or posterior oropharyngeal erythema.  Eyes:     General: No scleral icterus.       Right eye: No discharge.        Left eye: No discharge.     Conjunctiva/sclera: Conjunctivae normal.     Pupils: Pupils are equal, round, and reactive to light.  Neck:     Thyroid: No thyromegaly or thyroid tenderness.     Vascular: No JVD.  Cardiovascular:     Rate and Rhythm: Normal rate and regular rhythm.     Heart sounds: Normal heart sounds. No murmur heard. Pulmonary:     Effort: Pulmonary effort is normal. No respiratory distress.     Breath sounds: Normal breath sounds.  Abdominal:     General: Bowel sounds are normal. There is no distension.     Palpations: Abdomen is soft. There is no mass.     Tenderness: There is no abdominal tenderness. There is no guarding or rebound.  Genitourinary:    Vagina: Normal.  Musculoskeletal:        General: Normal range of motion.     Cervical back: Normal range of motion and neck supple.     Right lower leg: No edema.     Left lower leg: No edema.  Lymphadenopathy:     Cervical: No cervical adenopathy.  Skin:    General: Skin is warm and dry.     Findings: No erythema or rash.  Neurological:     Mental Status: She is alert and oriented to person,  place, and time.     Cranial Nerves: No cranial nerve deficit.     Deep Tendon Reflexes: Reflexes are normal and symmetric.  Psychiatric:        Mood and Affect: Mood normal.        Behavior: Behavior normal.        Thought Content: Thought content normal.        Judgment: Judgment normal.      No results found for any visits on 11/19/22.   Last CBC Lab Results  Component Value Date   WBC 4.4 11/16/2021   HGB 13.4 11/16/2021   HCT 39.7 11/16/2021   MCV 90.0 11/16/2021   MCH 30.4 11/16/2021   RDW 12.6 11/16/2021   PLT 206 11/16/2021   Last metabolic panel Lab Results  Component Value Date   GLUCOSE 82 11/16/2021   NA 141 11/16/2021   K 3.6 11/16/2021   CL 102 11/16/2021   CO2 27 11/16/2021   BUN 12 11/16/2021   CREATININE 0.76 11/16/2021   GFR 87.48 09/28/2018   CALCIUM 9.5 11/16/2021   PROT 6.5 11/16/2021   ALBUMIN 4.5 09/28/2018   BILITOT 0.6 11/16/2021   ALKPHOS 68 09/28/2018   AST 21 11/16/2021   ALT 17 11/16/2021   Last lipids Lab Results  Component Value Date   CHOL 193 11/16/2021   HDL 75 11/16/2021   LDLCALC 104 (H) 11/16/2021   TRIG 55 11/16/2021   CHOLHDL 2.6 11/16/2021   Last hemoglobin A1c No results found for: "HGBA1C" Last thyroid functions Lab Results  Component Value Date  TSH 1.57 11/16/2021   Last vitamin D Lab Results  Component Value Date   VD25OH 42 10/27/2020   Last vitamin B12 and Folate No results found for: "VITAMINB12", "FOLATE"    The 10-year ASCVD risk score (Arnett DK, et al., 2019) is: 3.4%    Assessment & Plan:   Problem List Items Addressed This Visit       Unprioritized   Preventative health care - Primary   Relevant Orders   CBC with Differential/Platelet   Comprehensive metabolic panel   Lipid panel   TSH   Essential hypertension   Relevant Medications   hydrochlorothiazide (HYDRODIURIL) 25 MG tablet   Other Visit Diagnoses     Need for influenza vaccination       Relevant Orders   Flu  vaccine trivalent PF, 6mos and older(Flulaval,Afluria,Fluarix,Fluzone) (Completed)   Inattention       Relevant Medications   atomoxetine (STRATTERA) 25 MG capsule   Insomnia, unspecified type       Relevant Medications   LORazepam (ATIVAN) 0.5 MG tablet   Depression with anxiety       Relevant Medications   LORazepam (ATIVAN) 0.5 MG tablet   Primary osteoarthritis involving multiple joints       Relevant Medications   HYDROcodone-acetaminophen (NORCO/VICODIN) 5-325 MG tablet   High risk medication use       Relevant Orders   Drug Monitoring Panel [id number] , Urine     Assessment and Plan    Osteoarthritis Chronic pain in hands, knees, and feet. Current pain management with Tramadol is ineffective. -Consider alternative pain management strategies.  Hyperlipidemia High Lipo A protein and high calcium score. Currently managed with Repatha. -Continue Repatha. -Order basic lipid panel, liver and kidney function tests, and thyroid function tests.  Sleep Disturbance Poor sleep due to pain. -Address pain management to improve sleep quality. -Consider sleep aid if pain management does not improve sleep.  Allergic Rhinitis Experiencing dizziness and congestion, currently using Flonase. -Try Astepro nasal spray.  General Health Maintenance -Administer influenza vaccine today. -Continue regular eye and dental check-ups. -Plan for colonoscopy.        Return in about 1 year (around 11/19/2023), or if symptoms worsen or fail to improve.    Donato Schultz, DO

## 2022-11-19 NOTE — Telephone Encounter (Signed)
Received. Placed in folder for review

## 2022-11-19 NOTE — Patient Instructions (Signed)
Preventive Care 40-62 Years Old, Female Preventive care refers to lifestyle choices and visits with your health care provider that can promote health and wellness. Preventive care visits are also called wellness exams. What can I expect for my preventive care visit? Counseling Your health care provider may ask you questions about your: Medical history, including: Past medical problems. Family medical history. Pregnancy history. Current health, including: Menstrual cycle. Method of birth control. Emotional well-being. Home life and relationship well-being. Sexual activity and sexual health. Lifestyle, including: Alcohol, nicotine or tobacco, and drug use. Access to firearms. Diet, exercise, and sleep habits. Work and work environment. Sunscreen use. Safety issues such as seatbelt and bike helmet use. Physical exam Your health care provider will check your: Height and weight. These may be used to calculate your BMI (body mass index). BMI is a measurement that tells if you are at a healthy weight. Waist circumference. This measures the distance around your waistline. This measurement also tells if you are at a healthy weight and may help predict your risk of certain diseases, such as type 2 diabetes and high blood pressure. Heart rate and blood pressure. Body temperature. Skin for abnormal spots. What immunizations do I need?  Vaccines are usually given at various ages, according to a schedule. Your health care provider will recommend vaccines for you based on your age, medical history, and lifestyle or other factors, such as travel or where you work. What tests do I need? Screening Your health care provider may recommend screening tests for certain conditions. This may include: Lipid and cholesterol levels. Diabetes screening. This is done by checking your blood sugar (glucose) after you have not eaten for a while (fasting). Pelvic exam and Pap test. Hepatitis B test. Hepatitis C  test. HIV (human immunodeficiency virus) test. STI (sexually transmitted infection) testing, if you are at risk. Lung cancer screening. Colorectal cancer screening. Mammogram. Talk with your health care provider about when you should start having regular mammograms. This may depend on whether you have a family history of breast cancer. BRCA-related cancer screening. This may be done if you have a family history of breast, ovarian, tubal, or peritoneal cancers. Bone density scan. This is done to screen for osteoporosis. Talk with your health care provider about your test results, treatment options, and if necessary, the need for more tests. Follow these instructions at home: Eating and drinking  Eat a diet that includes fresh fruits and vegetables, whole grains, lean protein, and low-fat dairy products. Take vitamin and mineral supplements as recommended by your health care provider. Do not drink alcohol if: Your health care provider tells you not to drink. You are pregnant, may be pregnant, or are planning to become pregnant. If you drink alcohol: Limit how much you have to 0-1 drink a day. Know how much alcohol is in your drink. In the [place] one drink equals one 12 oz bottle of beer (355 mL), one 5 oz glass of wine (148 mL), or one 1 oz glass of hard liquor (44 mL). Lifestyle Brush your teeth every morning and night with fluoride toothpaste. Floss one time each day. Exercise for at least 30 minutes 5 or more days each week. Do not use any products that contain nicotine or tobacco. These products include cigarettes, chewing tobacco, and vaping devices, such as e-cigarettes. If you need help quitting, ask your health care provider. Do not use drugs. If you are sexually active, practice safe sex. Use a condom or other form of protection to   prevent STIs. If you do not wish to become pregnant, use a form of birth control. If you plan to become pregnant, see your health care provider for a  prepregnancy visit. Take aspirin only as told by your health care provider. Make sure that you understand how much to take and what form to take. Work with your health care provider to find out whether it is safe and beneficial for you to take aspirin daily. Find healthy ways to manage stress, such as: Meditation, yoga, or listening to music. Journaling. Talking to a trusted person. Spending time with friends and family. Minimize exposure to UV radiation to reduce your risk of skin cancer. Safety Always wear your seat belt while driving or riding in a vehicle. Do not drive: If you have been drinking alcohol. Do not ride with someone who has been drinking. When you are tired or distracted. While texting. If you have been using any mind-altering substances or drugs. Wear a helmet and other protective equipment during sports activities. If you have firearms in your house, make sure you follow all gun safety procedures. Seek help if you have been physically or sexually abused. What's next? Visit your health care provider once a year for an annual wellness visit. Ask your health care provider how often you should have your eyes and teeth checked. Stay up to date on all vaccines. This information is not intended to replace advice given to you by your health care provider. Make sure you discuss any questions you have with your health care provider. Document Revised: 07/19/2020 Document Reviewed: 07/19/2020 Elsevier Patient Education  2024 Elsevier Inc.  

## 2022-11-21 LAB — DRUG MONITORING PANEL 376104, URINE
Amphetamines: NEGATIVE ng/mL (ref <500)
Barbiturates: NEGATIVE ng/mL (ref <300)
Benzodiazepines: NEGATIVE ng/mL (ref <100)
Cocaine Metabolite: NEGATIVE ng/mL (ref <150)
Desmethyltramadol: NEGATIVE ng/mL (ref <100)
Opiates: NEGATIVE ng/mL (ref <100)
Oxycodone: NEGATIVE ng/mL (ref <100)
Tramadol: NEGATIVE ng/mL (ref <100)

## 2022-11-21 LAB — DM TEMPLATE

## 2022-11-29 ENCOUNTER — Telehealth: Payer: Self-pay | Admitting: Family Medicine

## 2022-11-29 NOTE — Telephone Encounter (Signed)
Pt states she is needing a list of her health problems so that she can submit them to her husband's attorney for ins benefits. She would like this emailed to her.

## 2022-12-04 NOTE — Telephone Encounter (Signed)
Problem list emailed to email in chart

## 2023-01-07 ENCOUNTER — Other Ambulatory Visit: Payer: Self-pay | Admitting: Family Medicine

## 2023-01-07 DIAGNOSIS — M150 Primary generalized (osteo)arthritis: Secondary | ICD-10-CM

## 2023-01-07 MED ORDER — HYDROCODONE-ACETAMINOPHEN 5-325 MG PO TABS: 1.0000 | ORAL_TABLET | Freq: Four times a day (QID) | ORAL | 0 refills | Status: DC | PRN

## 2023-01-07 NOTE — Addendum Note (Signed)
Addended by: Roxanne Gates on: 01/07/2023 11:29 AM   Modules accepted: Orders

## 2023-01-07 NOTE — Telephone Encounter (Signed)
Requesting: NORCO Contract: 11/19/22 UDS: 11/19/22 Last OV: 11/19/22 Next OV: 11/21/23 Last Refill: 11/19/22, #[id number] RF Database:   Please advise

## 2023-01-07 NOTE — Telephone Encounter (Signed)
Patient called and would like a med refill on   HYDROcodone-acetaminophen (NORCO/VICODIN) 5-325 MG tablet    [facility]z [facility] [address]

## 2023-01-10 ENCOUNTER — Telehealth (HOSPITAL_BASED_OUTPATIENT_CLINIC_OR_DEPARTMENT_OTHER): Payer: Self-pay | Admitting: Cardiology

## 2023-01-10 NOTE — Telephone Encounter (Signed)
Will forward to PA team to initiate

## 2023-01-10 NOTE — Telephone Encounter (Signed)
Patient called stating she needs prior auth on her Evolocumab (REPATHA SURECLICK) 140 MG/ML SOAJ  and Bempedoic Acid (NEXLETOL) 180 MG TABS.

## 2023-01-10 NOTE — Telephone Encounter (Signed)
Patient requiring PA on nexletol and repatha. Thanks!

## 2023-01-13 ENCOUNTER — Other Ambulatory Visit (HOSPITAL_COMMUNITY): Payer: Self-pay

## 2023-01-13 ENCOUNTER — Telehealth: Payer: Self-pay

## 2023-01-13 NOTE — Telephone Encounter (Signed)
PA request has been Submitted. New Encounter created for follow up. For additional info see Pharmacy Prior Auth telephone encounter from 01/13/23.

## 2023-01-13 NOTE — Telephone Encounter (Signed)
Pharmacy Patient Advocate Encounter   Received notification from Physician's Office that prior authorization for REPATHA is required/requested.   Insurance verification completed.   The patient is insured through [facility] .   Per test claim: PA required; PA submitted to above mentioned insurance via CoverMyMeds Key/confirmation #/EOC [id number] Status is pending

## 2023-01-17 ENCOUNTER — Other Ambulatory Visit (HOSPITAL_COMMUNITY): Payer: Self-pay

## 2023-01-17 NOTE — Telephone Encounter (Signed)
Had to call plan for peer to peer. Patient is allergic to preferred drug on plan and there was no where on the [facility] request to flag this. Plan is reviewing now and will soon issue redetermination via fax.

## 2023-01-21 ENCOUNTER — Other Ambulatory Visit (HOSPITAL_COMMUNITY): Payer: Self-pay

## 2023-01-23 NOTE — Telephone Encounter (Addendum)
Pharmacy Patient Advocate Encounter  Received notification from [facility] that Prior Authorization for REPATHAhas been DENIED.  Full denial letter will be uploaded to the media tab. See denial reason below. Per plan doesn't meet definition of medical necessity.

## 2023-01-24 NOTE — Telephone Encounter (Signed)
I submitted 09/23/22 chart notes, the last LDL, as well as the allergy on the chart. I tried requesting a peer to peer as well and that was also denied. The only thing they will accept to justify the request at this point is a letter of medical necessity from provider explaining why patient cannot take the preferred drug. The allergy note I submitted only specified joint pain. It seems as though they want more than that to support the non formulary request.   Note: This is an [employer] plan, not the same plan patient had the approval on before.

## 2023-01-24 NOTE — Telephone Encounter (Signed)
Submitted chart allergy to Praluent to insurance and they are giving [place] a hard time approving. Insurance is requiring a letter of medical necessity for the Repatha stating why patient can't take Praluent (preferred drug). Please assist (full denial on chart media)

## 2023-01-27 ENCOUNTER — Encounter (HOSPITAL_BASED_OUTPATIENT_CLINIC_OR_DEPARTMENT_OTHER): Payer: Self-pay | Admitting: Family

## 2023-01-27 NOTE — Telephone Encounter (Signed)
Letter of medical necessity printed to be signed by Dr. Cristal Deer and faxed.   Alver Sorrow, NP

## 2023-01-28 ENCOUNTER — Other Ambulatory Visit (HOSPITAL_COMMUNITY): Payer: Self-pay

## 2023-01-28 ENCOUNTER — Telehealth: Payer: Self-pay

## 2023-01-28 NOTE — Telephone Encounter (Signed)
Pharmacy Patient Advocate Encounter  Received notification from [facility] that Prior Authorization for REPATHA has been APPROVED from 01/22/23 to 21/18/25. Ran test claim, Copay is $24.99. This test claim was processed through [facility]- copay amounts may vary at other pharmacies due to pharmacy/plan contracts, or as the patient moves through the different stages of their insurance plan.  THANKS SO MUCH FOR YOUR ASSISTANCE CAITLIN!

## 2023-01-28 NOTE — Telephone Encounter (Signed)
Left detailed VM per DPR that Repatha PA approved and that Nexletol PA still in process.   Alver Sorrow, NP

## 2023-01-28 NOTE — Telephone Encounter (Signed)
Pharmacy Patient Advocate Encounter   Received notification from Physician's Office that prior authorization for NEXLETOL is required/requested.   Insurance verification completed.   The patient is insured through [facility] .   Per test claim: PA required; PA submitted to above mentioned insurance via CoverMyMeds Key/confirmation #/EOC [id number] Status is pending

## 2023-01-30 NOTE — Telephone Encounter (Signed)
Pharmacy Patient Advocate Encounter  Received notification from [facility] Jari Favre) that Prior Authorization for NEXLETOL has been DENIED.  Full denial letter will be uploaded to the media tab. See denial reason below. Per plan patient does not meet the plan's medical necessity criteria for non-formulary drugs. To be considered for approval, you must show a trial and failure or inability to use ALL the preferred medications that are covered which include, zetia, fenofibrate, gemfibrozil, cholestyramine packets, colestipol, prevalite powder, atorvastatin, fluvastatin, lovastatin, pravastatin, simvastatin, rosuvastatin, and zetia-simvastatin.   *Records with plan show patient has only tried atorvastatin, pravastatin, and zetia since joining the plan. Plan requires information showing why patient cannot use the other preferred medications.

## 2023-02-05 ENCOUNTER — Encounter (HOSPITAL_BASED_OUTPATIENT_CLINIC_OR_DEPARTMENT_OTHER): Payer: Self-pay | Admitting: Family

## 2023-02-05 NOTE — Telephone Encounter (Signed)
 Obtaining coverage/prior authorization for Miss Kostka's lipid lowering agents has been frustrating.   As previously relayed to her insurance plan:  Praluent  - stopped in 2021 due to significant joint pains that resolved upon discontinuation Atorvastatin  - leg cramps Pravastatin  - flu like symptoms Pitavastatin  - myalgia Zetia  - myalgias  This allowed for coverage of Repatha . However, Nexletol  now denied as plan requires her to trial zetia , fenofibrate, gemfibrozil, cholestyramine packets, colestipol, prevalite powder, atorvastatin , fluvastatin, lovastatin, pravastatin , simvastatin, rosuvastatin, and zetia -simvastatin.   Will create letter of medical necessity for Nexletol  (see below, additionally available under 'letters') and submit to insurance plan.   Reche GORMAN Finder, NP   _________________________________   To whom it may concern,    Hira Trent has followed routinely with both general cardiology, Dr. Lonni, and our lipidologist, Dr. Mona. She has had reaction to multiple alternate cholesterol medications including:   Praluent  - stopped in 2021 due to significant joint pains that resolved upon discontinuation Atorvastatin  - leg cramps Pravastatin  - flu like symptoms Pitavastatin  - myalgia Zetia  - myalgias   Due to prior 01/2018 coronary calcium  score of 633 placing her in the 99th percentiler for age and sex matched control she was recommended for aggressive secondary prevention including LDL <70. Additionally, she had elevated Lp(a) of 452 on labs 11/2021 indicating familial hyperlipidemia and increased cardiovascular risk.  Letter of medical necessity previously provided and led to coverage of Repatha .   Writing today to provide letter of medical necessity for Nexletol . Given her history listed above, it is imperative that we control her cholesterol.   Your list of agents she is requested to trial includes:  atorvastatin , fluvastatin, lovastatin, pravastatin ,  simvastatin, rosuvastatin, and zetia -simvastatin. As she has trialed 3 statins (Atorvastatin , Pitavastatin , Rosuvastatin) trial of alternat statin including Fluvastatin, Lovastatin, Simvastatin, Rosuvastatin would be futile and almost certainly lead to similar side effects. Industry standard is for an individual to trial 2 statins prior to seeking alternative lipid lowering agents which she has exceeded. She is unable to trial zetia -simvastatin as she has previously not tolerated zetia .   Your list of agents she is requested to trial additionally includes: fenofibrate, gemfibrozil, cholestyramine packets, colestipol, prevalite powder. Lipid agents including fenofibrate, gemfibrozil, cholestyramine packets, colestipol, prevalite powder are predominantly used in the lowering of triglycerides. Miss Stooksbury's triglycerides have been routinely at goal <150 with serum triglyceride readings 05/2016 77 ? 09/2017 77 ? 04/2018 74 ? 05/2019 67 ? 10/2020 68 ? 11/2021 55 ? 11/2022 71. There is no indication for triglyceride lowering agent. In addition, fenofibrate typically only lower LDL by 5-10%. As her LDL goal is <70 and labs 11/2022 with LDL 97 a lowering of 10% would not get her LDL to goal necessitating further lipid lowering therapy (such as the requested Nexletol ).   Predominant focus is lowering LDL to goal of < 70 given her familial hyperlipidemia and coronary artery calcification with calcium  score 633 on 01/14/2018 placing her in the 99th percentile for age and sex matched control. This has been persistently difficult to obtain with recent lab results including  10/2020 LDL 139 ? 11/2021 LDL 104 ? 11/2022 LDL 97. In clinical trials, bempedoic acid  alone has shown to reduce LDL-C levels by 18% to 20% which would help to decrease LDL to goal for reduction of cardiovascular risk. Reduction of her cardiovascular risk would ultimately lead to decreased healthcaare costs.   Please consider this a letter of medical  necessity for coverage of Nexletol .   Best,  Reche GORMAN Finder,  NP

## 2023-02-05 NOTE — Telephone Encounter (Signed)
 Can you submit the letter to insurance please? Available in 'letters' tab and if I need to sign I can  scan signed copy to your email. Just let me know. TY!  Alver Sorrow, NP

## 2023-02-06 NOTE — Telephone Encounter (Signed)
 Yes, of course! Letter has been faxed to company.

## 2023-02-12 ENCOUNTER — Other Ambulatory Visit (HOSPITAL_COMMUNITY): Payer: Self-pay

## 2023-02-12 ENCOUNTER — Telehealth: Payer: Self-pay | Admitting: Cardiology

## 2023-02-12 ENCOUNTER — Telehealth: Payer: Self-pay

## 2023-02-12 DIAGNOSIS — E7849 Other hyperlipidemia: Secondary | ICD-10-CM

## 2023-02-12 DIAGNOSIS — Z8249 Family history of ischemic heart disease and other diseases of the circulatory system: Secondary | ICD-10-CM

## 2023-02-12 DIAGNOSIS — R931 Abnormal findings on diagnostic imaging of heart and coronary circulation: Secondary | ICD-10-CM

## 2023-02-12 MED ORDER — REPATHA SURECLICK 140 MG/ML ~~LOC~~ SOAJ: 140.0000 mg | SUBCUTANEOUS | 0 refills | Status: DC

## 2023-02-12 MED ORDER — NEXLETOL 180 MG PO TABS: 180.0000 mg | ORAL_TABLET | Freq: Every day | ORAL | 0 refills | Status: AC

## 2023-02-12 NOTE — Telephone Encounter (Signed)
 Pharmacy Patient Advocate Encounter   Received notification from Physician's Office that prior authorization for REPATHA  is required/requested.   Insurance verification completed.   The patient is insured through [employer] .   Per test claim: PA required; PA submitted to above mentioned insurance via CoverMyMeds Key/confirmation #/EOC [id number] Status is pending

## 2023-02-12 NOTE — Telephone Encounter (Signed)
 Called and spoke to patient. She is under a new insurance plan and will likely need PA for Nexlotol and Repatha. Insurance information sent to PA team.  She would like samples if possible while waiting for approval.  Will send to RX PA team/PharmD.

## 2023-02-12 NOTE — Telephone Encounter (Signed)
 error

## 2023-02-12 NOTE — Telephone Encounter (Signed)
 Yes Ok for samples.   Please complete PA for Nexletol and Repatha

## 2023-02-12 NOTE — Telephone Encounter (Signed)
 Patient would like to know what is the update on the auth on her medication and want to know if she don't get approved, she needs samples since she is out

## 2023-02-12 NOTE — Telephone Encounter (Signed)
*  STAT* If patient is at the pharmacy, call can be transferred to refill team.   1. Which medications need to be refilled? (please list name of each medication and dose if known) Bempedoic Acid  (NEXLETOL ) 180 MG TABS ; Evolocumab  (REPATHA  SURECLICK) 140 MG/ML SOAJ    2. Would you like to learn more about the convenience, safety, & potential cost savings by using the [facility]?     3. Are you open to using the [facility] (Type [facility]. ).   4. Which pharmacy/location (including street and city if local pharmacy) is medication to be sent to? [id number] [place] [place] - [address]    5. Do they need a 30 day or 90 day supply? 90

## 2023-02-12 NOTE — Telephone Encounter (Signed)
 Called and spoke to patient of samples. Pt aware that samples will be left in front desk and will need to ask for Repatha samples (need to refrigerated), She verbalized understanding.

## 2023-02-12 NOTE — Telephone Encounter (Signed)
 PA request for Repatha has been Submitted. New Encounter created for follow up. For additional info see Pharmacy Prior Auth telephone encounter from 02/12/23. Per test claim, [facility]

## 2023-02-13 ENCOUNTER — Other Ambulatory Visit (HOSPITAL_COMMUNITY): Payer: Self-pay

## 2023-02-13 NOTE — Telephone Encounter (Signed)
 Pharmacy Patient Advocate Encounter  Received notification from [employer] that Prior Authorization for REPATHA has been APPROVED from 02/12/23 to 02/12/24

## 2023-02-14 MED ORDER — REPATHA SURECLICK 140 MG/ML ~~LOC~~ SOAJ: 140.0000 mg | SUBCUTANEOUS | 3 refills | Status: DC

## 2023-02-14 NOTE — Addendum Note (Signed)
 Addended by: Cheree Ditto on: 02/14/2023 09:18 AM   Modules accepted: Orders

## 2023-02-24 ENCOUNTER — Other Ambulatory Visit: Payer: Self-pay

## 2023-02-24 MED ORDER — REPATHA SURECLICK 140 MG/ML ~~LOC~~ SOAJ
SUBCUTANEOUS | 3 refills | Status: DC
  Filled 2023-02-24: qty 6, 14d supply, fill #0

## 2023-03-06 ENCOUNTER — Other Ambulatory Visit (HOSPITAL_COMMUNITY): Payer: Self-pay

## 2023-03-06 ENCOUNTER — Other Ambulatory Visit: Payer: Self-pay | Admitting: Cardiology

## 2023-03-06 DIAGNOSIS — R931 Abnormal findings on diagnostic imaging of heart and coronary circulation: Secondary | ICD-10-CM

## 2023-03-06 DIAGNOSIS — E7849 Other hyperlipidemia: Secondary | ICD-10-CM

## 2023-03-06 DIAGNOSIS — Z8249 Family history of ischemic heart disease and other diseases of the circulatory system: Secondary | ICD-10-CM

## 2023-03-10 ENCOUNTER — Other Ambulatory Visit: Payer: Self-pay | Admitting: Family Medicine

## 2023-03-10 ENCOUNTER — Telehealth: Payer: Self-pay | Admitting: Cardiology

## 2023-03-10 ENCOUNTER — Other Ambulatory Visit (HOSPITAL_COMMUNITY): Payer: Self-pay

## 2023-03-10 DIAGNOSIS — Z789 Other specified health status: Secondary | ICD-10-CM

## 2023-03-10 DIAGNOSIS — E7849 Other hyperlipidemia: Secondary | ICD-10-CM

## 2023-03-10 DIAGNOSIS — M150 Primary generalized (osteo)arthritis: Secondary | ICD-10-CM

## 2023-03-10 MED ORDER — NEXLETOL 180 MG PO TABS: 180.0000 mg | ORAL_TABLET | Freq: Every day | ORAL | 0 refills | Status: DC

## 2023-03-10 MED ORDER — REPATHA SURECLICK 140 MG/ML ~~LOC~~ SOAJ: 140.0000 mg | SUBCUTANEOUS | 0 refills | Status: DC

## 2023-03-10 MED ORDER — HYDROCODONE-ACETAMINOPHEN 5-325 MG PO TABS: 1.0000 | ORAL_TABLET | Freq: Four times a day (QID) | ORAL | 0 refills | Status: DC | PRN

## 2023-03-10 NOTE — Telephone Encounter (Signed)
Called and spoke to pt. Aware to bring physical copy of insurance card to office when she picks up samples. Pt stated she will also upload insurance file to MyChart due to not having insurance card at this time.  Samples awaiting pick up.

## 2023-03-10 NOTE — Telephone Encounter (Signed)
[facility] Picked up Nexletol 90 day supply on 11/07/22. Picked up 90 day supply Repatha on 11/15/22. They had ready for her to pick up 02/12/23 and 02/18/23 but returned to stock due to issues with copay cards.   May provide 2 week supply of Nexletol and 1 box of Repatha.   Will ask triage team to have her send copy of insurance card via MyChart if possible. Also, ideally would bring physical card to office when she picks up samples so we can scan.   Alver Sorrow, NP

## 2023-03-10 NOTE — Telephone Encounter (Signed)
Pt c/o medication issue:  1. Name of Medication: Bempedoic Acid (NEXLETOL) 180 MG TABS   Evolocumab (REPATHA SURECLICK) 140 MG/ML SOAJ    2. How are you currently taking this medication (dosage and times per day)? As Written  3. Are you having a reaction (difficulty breathing--STAT)? No  4. What is your medication issue? Patient stated she now has insurance with Tourist information centre manager. Patient stated she will need PA for both the medications.  Patient calling the office for samples of medication:   1.  What medication and dosage are you requesting samples for?  Evolocumab (REPATHA SURECLICK) 140 MG/ML SOAJ  Bempedoic Acid (NEXLETOL) 180 MG TABS   2.  Are you currently out of this medication?   Yes

## 2023-03-10 NOTE — Telephone Encounter (Signed)
Copied from CRM [id number]. Topic: Clinical - Medication Refill >> Mar 10, 2023  9:46 AM Lennart Pall wrote: Most Recent Primary Care Visit:  Provider: Seabron Spates R  Department: [facility]  Visit Type: PHYSICAL  Date: 11/19/2022  Medication: HYDROcodone-acetaminophen  Has the patient contacted their pharmacy? Yes (Agent: If no, request that the patient contact the pharmacy for the refill. If patient does not wish to contact the pharmacy document the reason why and proceed with request.) (Agent: If yes, when and what did the pharmacy advise?)  Is this the correct pharmacy for this prescription? Yes If no, delete pharmacy and type the correct one.  This is the patient's preferred pharmacy:  [facility]  [address] [place], [place], [place] [address] [phone]  Has the prescription been filled recently? Yes  Is the patient out of the medication? Yes  Has the patient been seen for an appointment in the last year OR does the patient have an upcoming appointment? Yes  Can we respond through MyChart? Yes  Agent: Please be advised that Rx refills may take up to 3 business days. We ask that you follow-up with your pharmacy.

## 2023-03-11 ENCOUNTER — Other Ambulatory Visit (HOSPITAL_COMMUNITY): Payer: Self-pay

## 2023-03-11 ENCOUNTER — Telehealth: Payer: Self-pay | Admitting: Pharmacy Technician

## 2023-03-11 DIAGNOSIS — Z5181 Encounter for therapeutic drug level monitoring: Secondary | ICD-10-CM

## 2023-03-11 DIAGNOSIS — E7849 Other hyperlipidemia: Secondary | ICD-10-CM

## 2023-03-11 NOTE — Telephone Encounter (Signed)
I spoke to the patient and she is going to get the repatha but asking for nexletol to be changed. See other encounter

## 2023-03-11 NOTE — Telephone Encounter (Signed)
 Will forward to Dr Cristal Deer for review

## 2023-03-11 NOTE — Telephone Encounter (Signed)
 Pharmacy Patient Advocate Encounter   Received notification from Pt Calls Messages that prior authorization for Repatha  SureClick 140MG /ML auto-injectors is required/requested.   Insurance verification completed.   The patient is insured through [facility] .   Per test claim: PA required; PA submitted to above mentioned insurance via CoverMyMeds Key/confirmation #/EOC [id number] Status is pending

## 2023-03-11 NOTE — Telephone Encounter (Signed)
 Pharmacy Patient Advocate Encounter   Received notification from Pt Calls Messages that prior authorization for Nexletol  180MG  tablets is required/requested.   Insurance verification completed.   The patient is insured through [facility] .   Per test claim: PA required; PA submitted to above mentioned insurance via CoverMyMeds Key/confirmation #/EOC [id number] Status is pending

## 2023-03-11 NOTE — Telephone Encounter (Signed)
 Pharmacy Patient Advocate Encounter  Received notification from [facility] that Prior Authorization for Repatha  SureClick 140MG /ML auto-injectors  has been APPROVED from 03/11/23 to 06/08/23. Spoke to pharmacy to process.Copay is $20.00.   The coupon did not work for repatha  that she had on file there nor the one I gave them due to it looking like she has medicaid.   PA #/Case ID/Reference #: [id number]

## 2023-03-11 NOTE — Telephone Encounter (Signed)
 Pharmacy Patient Advocate Encounter  Received notification from [facility] that Prior Authorization for Nexletol  180MG  tablets has been APPROVED from 03/11/23 to 06/08/23. Spoke to pharmacy to process.Copay is $405.80.  The patient had a coupon on file at her pharmacy and due to her insurance is coming up as medicaid (even though she does not have medicaid) the coupon automatically rejected.    PA #/Case ID/Reference #: [id number]    Patient is interested seeing if she could go back to ezetimibe  10mg . That would only be a 2.42 copay.

## 2023-03-13 ENCOUNTER — Other Ambulatory Visit (HOSPITAL_BASED_OUTPATIENT_CLINIC_OR_DEPARTMENT_OTHER): Payer: Self-pay

## 2023-03-13 MED ORDER — NEXLETOL 180 MG PO TABS
1.0000 | ORAL_TABLET | Freq: Every day | ORAL | 3 refills | Status: DC
  Filled 2023-03-13: qty 30, 30d supply, fill #0

## 2023-03-13 NOTE — Addendum Note (Signed)
 Addended by: Zyron Deeley S on: 03/13/2023 05:19 PM   Modules accepted: Orders

## 2023-03-13 NOTE — Telephone Encounter (Signed)
 Repatha  is affordable, go ahead and start that as PA approved for $20 copay.  Confirmed with Marit, LCSW she does not have Medicaid (checked [employer]) but for some reason coupon is not working. There are some commercial plans that don't allow coupon use through [employer]. Would recommend reaching out to pharmacy team downstairs to see if they can trial Nexletol  Rx with coupon to see if they can run it with coupon or if it is an insurance issue.   Hailey - I sent an Rx with coupon to trial.   If pharmacy cannot utilize coupon, okay to use Zetia  and Repatha  with repeat FLP/LFT after 3 months.   Adaria Hole S Suvan Stcyr, NP

## 2023-03-14 ENCOUNTER — Other Ambulatory Visit (HOSPITAL_BASED_OUTPATIENT_CLINIC_OR_DEPARTMENT_OTHER): Payer: Self-pay

## 2023-03-24 MED ORDER — EZETIMIBE 10 MG PO TABS: 10.0000 mg | ORAL_TABLET | Freq: Every day | ORAL | 3 refills | Status: DC

## 2023-03-24 NOTE — Telephone Encounter (Signed)
 Advised patient, verbalized understanding

## 2023-03-24 NOTE — Addendum Note (Signed)
 Addended by: Regis Bill B on: 03/24/2023 04:27 PM   Modules accepted: Orders

## 2023-03-24 NOTE — Telephone Encounter (Signed)
 After reviewing chart patient aware changing to Repatha Left message to call back to discuss Zetia

## 2023-04-16 ENCOUNTER — Other Ambulatory Visit: Payer: Self-pay | Admitting: Family Medicine

## 2023-04-16 DIAGNOSIS — I10 Essential (primary) hypertension: Secondary | ICD-10-CM

## 2023-04-29 ENCOUNTER — Ambulatory Visit: Payer: Self-pay

## 2023-04-29 NOTE — Telephone Encounter (Signed)
 Chief Complaint: bilateral foot pain Symptoms: bilateral foot pain, intermittent numbness Frequency: 5 years and getting worse the last 4 days Pertinent Negatives: Patient denies swelling, redness, skin color or temperature change, fever Disposition: [] ED /[] Urgent Care (no appt availability in office) / [x] Appointment(In office/virtual)/ []  [facility]/ [] Home Care/ [] Refused Recommended Disposition /[] [facility]/ []  Follow-up with PCP Additional Notes: Pt reports foot pain for 5 years with a hx of arthritis that is getting worse. Pt states she was walking 4 days ago when the pain became much more severe. Pt states usually the pain subsides on it's own but this time it is not. Pt has 7/10 pain at rest sometimes, but no pain right now while she is sitting. 10/10 pain with walking. Intermittent numbness. No one-sided weakness. No swelling, redness, skin color change, skin temperature change. Pt taking hydrocodone with arthritis, states it is helping some. RN scheduled pt for tomorrow at 1300. Pt agreeable to that plan. Pt asked to be placed on a waitlist for Dr. Almeta Monas, her PCP. RN added pt to a waitlist and called [facility] to confirm. RN advised pt if her pain worsens or if she develops swelling, skin color change, or any new symptoms she needs to call us back and the pt verbalized understanding.     Copied from CRM [id number]. Topic: Clinical - Red Word Triage >> Apr 29, 2023  2:48 PM Truddie Crumble wrote: Reason for CRM: patient is having feet pain and it is hard to walk. Patient stated it is like a stabbing and shooting pain Reason for Disposition  [1] MODERATE pain (e.g., interferes with normal activities, limping) AND [2] present > 3 days  Answer Assessment - Initial Assessment Questions 1. ONSET: "When did the pain start?"      5 years ago, got "really bad" in the last 4 days, "having trouble walking", "sometimes when I sit or lie down I have trouble walking when I get up", "usually  it stops but it is not stopping now", "I have arthritis and bone spurs", "my feet go numb sometimes" (that has happened before) 2. LOCATION: "Where is the pain located?"      Bilateral pain, R foot is worse 3. PAIN: "How bad is the pain?"    (Scale 1-10; or mild, moderate, severe)  - MILD (1-3): doesn't interfere with normal activities.   - MODERATE (4-7): interferes with normal activities (e.g., work or school) or awakens from sleep, limping.   - SEVERE (8-10): excruciating pain, unable to do any normal activities, unable to walk.      "I tried to walk at the park last Thursday and all of a sudden I had to hobble back to the car", "when I am walking on it it takes my breath away", 10/10 pain with walking, 7/10 pain at rest 4. WORK OR EXERCISE: "Has there been any recent work or exercise that involved this part of the body?"      No 5. CAUSE: "What do you think is causing the foot pain?"     Not sure  6. OTHER SYMPTOMS: "Do you have any other symptoms?" (e.g., leg pain, rash, fever, numbness) Pt states she recently changed her cholesterol medication to Zetia from a statin. Denies muscles aches. Denies swelling, denies redness, she can wiggle her toes, "they feel numb at times." Hx of arthritis. Has seen podiatrist, but it has been 4-5 years. Has tried ibuprofen and "it doesn't even touch it", taking hydrocodone at night and "that helps  some"  Protocols used: Foot Pain-A-AH

## 2023-04-30 ENCOUNTER — Encounter: Payer: Self-pay | Admitting: Physician Assistant

## 2023-04-30 ENCOUNTER — Ambulatory Visit: Admitting: Physician Assistant

## 2023-04-30 VITALS — BP 120/90 | HR 70 | Temp 98.1°F | Resp 16 | Ht 64.0 in | Wt 161.8 lb

## 2023-04-30 DIAGNOSIS — M79672 Pain in left foot: Secondary | ICD-10-CM

## 2023-04-30 DIAGNOSIS — M79671 Pain in right foot: Secondary | ICD-10-CM | POA: Diagnosis not present

## 2023-04-30 DIAGNOSIS — R42 Dizziness and giddiness: Secondary | ICD-10-CM

## 2023-04-30 DIAGNOSIS — M159 Polyosteoarthritis, unspecified: Secondary | ICD-10-CM

## 2023-04-30 MED ORDER — GABAPENTIN 300 MG PO CAPS: 300.0000 mg | ORAL_CAPSULE | Freq: Three times a day (TID) | ORAL | 0 refills | Status: DC

## 2023-04-30 NOTE — Progress Notes (Signed)
 Established patient visit   Patient: Sonya [name]   DOB: 1960-06-12   [name]  MRN: [id number] Visit Date: 04/30/2023  Today's healthcare provider: Alfredia Ferguson, PA-C   Chief Complaint  Patient presents with   Foot Pain    Bilateral pain, x1 week, pain with walking.    Subjective     Pt reports bilateral foot pain w/ intermittent numbness. This has occurred for years ,but last 4- 7 days has been getting worse. She has tried vicodin, tylenol, ibuprofen, celebrex with no relief. Some numbness in her toes.   She also mentions chronic, intermittent vertigo/dizziness associated with sinus congestion. She had covid a few weeks ago and has had residual congestion and dizziness since.  Medications: Outpatient Medications Prior to Visit  Medication Sig   ALPRAZolam (XANAX) 0.5 MG tablet Take 1 tablet (0.5 mg total) by mouth 3 (three) times daily as needed.   aspirin EC 81 MG tablet Take 81 mg by mouth daily. Swallow whole.   atomoxetine (STRATTERA) 25 MG capsule Take 1 capsule (25 mg total) by mouth daily.   azelastine (ASTELIN) 0.1 % nasal spray Place 2 sprays into both nostrils 2 (two) times daily. Use in each nostril as directed   Biotin 1000 MCG tablet Take 1,000 mcg by mouth daily.   CALCIUM CITRATE-VITAMIN D3 PO Take 1 capsule by mouth daily.   Cholecalciferol (VITAMIN D3) 25 MCG (1000 UT) CAPS Take 1 capsule by mouth daily.   [facility]S OMEGA-3 KRILL OIL 500 MG CAPS Take 1 capsule by mouth daily.   estradiol (ESTRACE) 0.5 MG tablet Take 1 tablet (0.5 mg total) by mouth daily.   Evolocumab (REPATHA SURECLICK) 140 MG/ML SOAJ INJECT 140 MG INTO THE SKIN EVERY 14 (FOURTEEN) DAYS.   Evolocumab (REPATHA SURECLICK) 140 MG/ML SOAJ Inject 140 mg into the skin every 14 (fourteen) days.   ezetimibe (ZETIA) 10 MG tablet Take 1 tablet (10 mg total) by mouth daily.   guanFACINE (TENEX) 1 MG tablet TAKE 1 TABLET BY MOUTH EVERYDAY AT BEDTIME   hydrochlorothiazide (HYDRODIURIL) 25 MG  tablet TAKE 1 TABLET (25 MG TOTAL) BY MOUTH DAILY.   HYDROcodone-acetaminophen (NORCO/VICODIN) 5-325 MG tablet Take 1 tablet by mouth every 6 (six) hours as needed for moderate pain (pain score 4-6).   lidocaine (XYLOCAINE) 2 % solution Use as directed 15 mLs in the mouth or throat every 6 (six) hours as needed for mouth pain. Gargle and spit   LORazepam (ATIVAN) 0.5 MG tablet Take 1 tablet (0.5 mg total) by mouth every 6 (six) hours as needed for anxiety.   MAGNESIUM OXIDE PO Take 200 mg by mouth daily.   meclizine (ANTIVERT) 25 MG tablet Take 1 tablet (25 mg total) by mouth 3 (three) times daily as needed for dizziness.   medroxyPROGESTERone (PROVERA) 2.5 MG tablet Take 1 tablet (2.5 mg total) by mouth daily.   methocarbamol (ROBAXIN) 500 MG tablet TAKE 1 TABLET BY MOUTH FOUR TIMES A DAY   Phosphatidylserine 100 MG CAPS Take 1 capsule by mouth daily.   scopolamine (TRANSDERM-SCOP) 1 MG/3DAYS Place 1 patch (1.5 mg total) onto the skin every 3 (three) days.   Turmeric Curcumin 500 MG CAPS Take 1 capsule by mouth daily.   Ubiquinol 100 MG CAPS Take 1 mg by mouth daily.   No facility-administered medications prior to visit.    Review of Systems  Constitutional:  Negative for fatigue and fever.  Respiratory:  Negative for cough and shortness of breath.  Cardiovascular:  Negative for chest pain and leg swelling.  Gastrointestinal:  Negative for abdominal pain.  Musculoskeletal:  Positive for arthralgias.  Neurological:  Negative for dizziness and headaches.        Objective    BP (!) 120/90 (BP Location: Left Arm, Patient Position: Sitting, Cuff Size: Normal)   Pulse 70   Temp 98.1 F (36.7 C) (Oral)   Resp 16   Ht 5\' 4"  (1.626 m)   Wt 161 lb 12.8 oz (73.4 kg)   LMP 02/23/2016   SpO2 97%   BMI 27.77 kg/m     Physical Exam Vitals reviewed.  Constitutional:      Appearance: She is not ill-appearing.  HENT:     Head: Normocephalic.  Eyes:     Conjunctiva/sclera: Conjunctivae  normal.  Cardiovascular:     Rate and Rhythm: Normal rate.     Pulses:          Dorsalis pedis pulses are 2+ on the right side and 2+ on the left side.       Posterior tibial pulses are 2+ on the right side and 2+ on the left side.  Pulmonary:     Effort: Pulmonary effort is normal. No respiratory distress.  Feet:     Right foot:     Protective Sensation: 4 sites tested.  4 sites sensed.     Skin integrity: Skin integrity normal.     Toenail Condition: Right toenails are normal.     Left foot:     Protective Sensation: 4 sites tested.  4 sites sensed.     Skin integrity: Skin integrity normal.     Toenail Condition: Left toenails are normal.  Neurological:     Mental Status: She is alert and oriented to person, place, and time.  Psychiatric:        Mood and Affect: Mood normal.        Behavior: Behavior normal.     No results found for any visits on 04/30/23.  Assessment & Plan    Bilateral foot pain Generalized OA Monofilament testing normal. Flare of OA likely, not responding to antiinflammatories or narcotics. Will try gabapentin -- pt has history, 3-4 years ago of steroid injections w/ a podiatrist. If no relief with gabapentin, encourage f/b with podiatry.   -     Gabapentin; Take 1 capsule (300 mg total) by mouth 3 (three) times daily.  Dispense: 30 capsule; Refill: 0  Vertigo Vertigo assw/ nasal congestion. Cont antihistamines, sudafed prn. Given info on epley maneuver.   Return if symptoms worsen or fail to improve.       Alfredia Ferguson, PA-C  [facility] [phone] (phone) [phone] (fax)  [facility]

## 2023-05-05 ENCOUNTER — Other Ambulatory Visit: Payer: Self-pay

## 2023-05-05 DIAGNOSIS — N951 Menopausal and female climacteric states: Secondary | ICD-10-CM

## 2023-05-05 MED ORDER — ESTRADIOL 0.5 MG PO TABS: 0.5000 mg | ORAL_TABLET | Freq: Every day | ORAL | 3 refills | Status: DC

## 2023-05-07 ENCOUNTER — Other Ambulatory Visit: Payer: Self-pay | Admitting: Family Medicine

## 2023-05-07 DIAGNOSIS — M150 Primary generalized (osteo)arthritis: Secondary | ICD-10-CM

## 2023-05-07 MED ORDER — HYDROCODONE-ACETAMINOPHEN 5-325 MG PO TABS: 1.0000 | ORAL_TABLET | Freq: Four times a day (QID) | ORAL | 0 refills | Status: DC | PRN

## 2023-05-07 NOTE — Telephone Encounter (Signed)
 Copied from CRM [id number]. Topic: Clinical - Medication Refill >> May 07, 2023  1:11 PM Aisha D wrote: Most Recent Primary Care Visit:  Provider: Alfredia Ferguson  Department: [facility]  Visit Type: ACUTE  Date: 04/30/2023  Medication: HYDROcodone-acetaminophen (NORCO/VICODIN) 5-325 MG tablet  Has the patient contacted their pharmacy? Yes (Agent: If no, request that the patient contact the pharmacy for the refill. If patient does not wish to contact the pharmacy document the reason why and proceed with request.) (Agent: If yes, when and what did the pharmacy advise?)  Is this the correct pharmacy for this prescription? Yes If no, delete pharmacy and type the correct one.  This is the patient's preferred pharmacy:  [phone] [place], [place] - [address] [place] [address] Phone: [phone] Fax: [phone]   Has the prescription been filled recently? No  Is the patient out of the medication? Yes  Has the patient been seen for an appointment in the last year OR does the patient have an upcoming appointment? Yes  Can we respond through MyChart? Yes  Agent: Please be advised that Rx refills may take up to 3 business days. We ask that you follow-up with your pharmacy.

## 2023-05-07 NOTE — Telephone Encounter (Signed)
 Requesting: hydrocodone 5-325mg   Contract: 11/19/22 UDS: 11/19/22 Last Visit: 11/19/22 Next Visit: 11/21/23 Last Refill: 03/10/23 #45 and 0RF   Please Advise

## 2023-05-08 ENCOUNTER — Other Ambulatory Visit: Payer: Self-pay | Admitting: Family Medicine

## 2023-05-08 ENCOUNTER — Telehealth (HOSPITAL_BASED_OUTPATIENT_CLINIC_OR_DEPARTMENT_OTHER): Payer: Self-pay | Admitting: Cardiology

## 2023-05-08 ENCOUNTER — Encounter (HOSPITAL_BASED_OUTPATIENT_CLINIC_OR_DEPARTMENT_OTHER): Payer: Self-pay

## 2023-05-08 ENCOUNTER — Telehealth: Payer: Self-pay

## 2023-05-08 NOTE — Telephone Encounter (Signed)
 Called and spoke to pt. Discussed APP's recommendations. She verbalized understanding.

## 2023-05-08 NOTE — Telephone Encounter (Signed)
 Pt c/o medication issue:  1. Name of Medication: Zetia  2. How are you currently taking this medication (dosage and times per day)?   3. Are you having a reaction (difficulty breathing--STAT)?   4. What is your medication issue? Having problems walking

## 2023-05-08 NOTE — Telephone Encounter (Signed)
 Called and spoke to patient. Patient verified with 2 identifiers (DOB and Last Name)  Patient's concerns: Patient states she been having pain in her feet within her joints and nerves. States she had issues with Zetia before and had to be taken off of it. She is worried about not being on medication due to her elevated calcium score.  Nurse's Recommendations: Recommended to stay off of Zetia for 2 weeks and call office and let us how she is feeling afterwards. Will route to APP for any further recommendations.   Patient verbalized understanding.

## 2023-05-08 NOTE — Telephone Encounter (Signed)
 Copied from CRM [id number]. Topic: Clinical - Medical Advice >> May 08, 2023  8:05 AM Kathryne Eriksson wrote: Patient called in this morning, stating that the gabapentin (NEURONTIN) 300 MG capsule isn't helping. Patient wants to know if there's anything else she could take.

## 2023-05-08 NOTE — Telephone Encounter (Signed)
Pt returning nurse call. Please advise.

## 2023-05-08 NOTE — Addendum Note (Signed)
 Addended by: Gabriel Rung on: 05/08/2023 12:34 PM   Modules accepted: Orders

## 2023-05-08 NOTE — Telephone Encounter (Signed)
 Hold zetia. Continue repatha. If difficulty persists, recommend follow up with PCP. Leg pain would be atypical side effect of Zetia overall.  Alver Sorrow, NP

## 2023-05-08 NOTE — Telephone Encounter (Signed)
 Called and left voice message to call back

## 2023-05-13 NOTE — Telephone Encounter (Signed)
 Pt spoke with Cardio.

## 2023-05-23 ENCOUNTER — Encounter: Payer: Self-pay | Admitting: Family Medicine

## 2023-06-01 ENCOUNTER — Other Ambulatory Visit: Payer: Self-pay | Admitting: Cardiology

## 2023-06-01 DIAGNOSIS — E7849 Other hyperlipidemia: Secondary | ICD-10-CM

## 2023-06-01 DIAGNOSIS — R931 Abnormal findings on diagnostic imaging of heart and coronary circulation: Secondary | ICD-10-CM

## 2023-06-01 DIAGNOSIS — Z8249 Family history of ischemic heart disease and other diseases of the circulatory system: Secondary | ICD-10-CM

## 2023-06-04 ENCOUNTER — Telehealth: Payer: Self-pay | Admitting: Pharmacy Technician

## 2023-06-04 NOTE — Telephone Encounter (Signed)
 Received Key: [id number] to do prior auth for nexletol  but pt was chgd to repatha . Made sure pharmacy cancelled nexletol 

## 2023-06-13 ENCOUNTER — Telehealth: Payer: Self-pay | Admitting: Cardiology

## 2023-06-13 NOTE — Telephone Encounter (Signed)
 Pt c/o medication issue:  1. Name of Medication: Zetia  and Nexletol   2. How are you currently taking this medication (dosage and times per day)? N/A  3. Are you having a reaction (difficulty breathing--STAT)? no  4. What is your medication issue? Patient was taken off of these medications due to side effects (see phone note from 04/03). She has stopped both medications but has not been put on anything else and she states that he symptoms have still not resolved. Please advise.

## 2023-06-13 NOTE — Telephone Encounter (Signed)
 Called and spoke to patient. Patient verified with 2 identifiers (DOB and Last Name)  Patient states: Pt states she would like to know what we can do about her foot pain./ She says that after stopping Zetia , her symptoms "kinda got better" but two days ago, her foot pain came back and is concerned of not being on medication regarding high calcium  score and does not want to have a stroke. Pt states she saw PCP and they prescribed her gabapentin  but she has felt no relief - she states she made PCP aware but no answer as of yet.  Nurse's Recommendations: Will relay info to MD/APP for ant further recommendations. Will call back on Monday.  Patient verbalized understanding.

## 2023-06-18 NOTE — Telephone Encounter (Signed)
 Called and spoke to pt; discussed MD's message copied below:  Sheryle Donning, MD  You39 minutes ago (12:30 PM)   Since she is already on repatha , did not tolerate statins, and she did not tolerate nexletol  or ezetimibe , we don't have additional agents that we have not tried. There are sometimes clinical trials available for new medications, I can reach out to the clinical trials team to see if there is anything currently recruiting if she is interested.   Pt states she tolerated Nexletol  but insurance was her issue. Sje is willing to restart Nexletol  if necessary and she is still willing to try clinical trials. Will route to Dr. Veryl Gottron so she is aware.

## 2023-06-23 ENCOUNTER — Telehealth (HOSPITAL_BASED_OUTPATIENT_CLINIC_OR_DEPARTMENT_OTHER): Payer: Self-pay | Admitting: Cardiology

## 2023-06-23 NOTE — Telephone Encounter (Signed)
 Pt c/o BP issue: STAT if pt c/o blurred vision, one-sided weakness or slurred speech.  STAT if BP is GREATER than 180/120 TODAY.  STAT if BP is LESS than 90/60 and SYMPTOMATIC TODAY  1. What is your BP concern?   Patient is concerned her BP has been high  2. Have you taken any BP medication today?  Yes  3. What are your last 5 BP readings?  139/92 3:00 pm today 128/87  12:30 pm today 140/98  9:30 am today 144/95   9:00 am today 144/89 (yesterday)  4. Are you having any other symptoms (ex. Dizziness, headache, blurred vision, passed out)?   Mild headache, floaters  Patient stated yesterday she felt bad but today she feels better. Patient is concerned about next steps.

## 2023-06-23 NOTE — Telephone Encounter (Signed)
 Returned call to pt,   Pt states she had a really bad headache Saturday but she couldn't check bp bc she wasn't home. Yesterday and today BP was elevated-   139/92 3:00 pm today 128/87  12:30 pm today 140/98  9:30 am today 144/95   9:00 am today 144/89 (yesterday)  Patient does currently take hydrochlorothiazide  25mg  daily- she reports taking 2.5 tablets yesterday and not much change to BP readings. She states she spoke with Chantel last week about elevated BP and her cholesterol and just wanted to followup on what next steps would be. Advised provider not inoffice today but will send for review. ED precautions reviewed.   Please also see last phone encounter

## 2023-06-24 MED ORDER — AMLODIPINE BESYLATE 5 MG PO TABS: 5.0000 mg | ORAL_TABLET | Freq: Every day | ORAL | 3 refills | Status: AC

## 2023-06-24 NOTE — Telephone Encounter (Signed)
 Returned call to pt, recommendations provided- Rx to pharmacy. Will route to scheduling for assistance in getting pt in for BP check

## 2023-06-24 NOTE — Telephone Encounter (Signed)
 Recommend starting amlodipine 5mg  daily. Continue hydrochlorothiazide  25mg  daily (do not need to take extra tablets) and follow up with Dr Veryl Gottron or APP in office

## 2023-07-03 ENCOUNTER — Other Ambulatory Visit: Payer: Self-pay | Admitting: Family Medicine

## 2023-07-03 DIAGNOSIS — M150 Primary generalized (osteo)arthritis: Secondary | ICD-10-CM

## 2023-07-03 MED ORDER — HYDROCODONE-ACETAMINOPHEN 5-325 MG PO TABS: 1.0000 | ORAL_TABLET | Freq: Four times a day (QID) | ORAL | 0 refills | Status: DC | PRN

## 2023-07-03 NOTE — Telephone Encounter (Signed)
 Copied from CRM [id number]. Topic: Clinical - Medication Refill >> Jul 03, 2023 10:04 AM Vivian Z wrote: Medication: HYDROcodone -acetaminophen  (NORCO/VICODIN) 5-325 MG tablet  Has the patient contacted their pharmacy? Yes (Agent: If no, request that the patient contact the pharmacy for the refill. If patient does not wish to contact the pharmacy document the reason why and proceed with request.) (Agent: If yes, when and what did the pharmacy advise?) pharmacy advised to contact office.  This is the patient's preferred pharmacy:  [phone], [place] - [address] [place] [address] Phone: [phone] Fax: [phone]  Is this the correct pharmacy for this prescription? Yes If no, delete pharmacy and type the correct one.   Has the prescription been filled recently? No  Is the patient out of the medication? Yes  Has the patient been seen for an appointment in the last year OR does the patient have an upcoming appointment? Yes  Can we respond through MyChart? No  Agent: Please be advised that Rx refills may take up to 3 business days. We ask that you follow-up with your pharmacy.

## 2023-07-03 NOTE — Telephone Encounter (Signed)
 Requesting: hydrocodone  5-325mg   Contract: 11/19/22 UDS: 11/19/22 Last Visit: 11/19/22 Next Visit: 11/21/23 Last Refill: 05/07/23 #45 and 0RF   Please Advise

## 2023-07-10 ENCOUNTER — Telehealth: Payer: Self-pay | Admitting: Pharmacy Technician

## 2023-07-10 NOTE — Telephone Encounter (Signed)
 Pharmacy Patient Advocate Encounter   Received notification from CoverMyMeds that prior authorization for Repatha  is required/requested.   Insurance verification completed.   The patient is insured through [facility] .   Per test claim: PA required; PA submitted to above mentioned insurance via CoverMyMeds Key/confirmation #/EOC [id number] Status is pending

## 2023-07-11 ENCOUNTER — Telehealth (HOSPITAL_BASED_OUTPATIENT_CLINIC_OR_DEPARTMENT_OTHER): Payer: Self-pay | Admitting: Cardiology

## 2023-07-11 NOTE — Telephone Encounter (Signed)
 Pt c/o medication issue:  1. Name of Medication: ezetimibe  (ZETIA ) 10 MG tablet   2. How are you currently taking this medication (dosage and times per day)? 1 tablet daily  3. Are you having a reaction (difficulty breathing--STAT)? No   4. What is your medication issue? Called to sch 2-4 wk f/u for BP check per 05/19 tele note. Patient reported she started back taking this medication 2 wks ago due to knowing she cannot afford the other since she can't take statins. Due to this she is wanting to know if scheduling the f/u should be postponed. Please advise.

## 2023-07-11 NOTE — Telephone Encounter (Signed)
 Spoke w/ pt to sch f/u. See 06/06 tele note for update.

## 2023-07-11 NOTE — Telephone Encounter (Signed)
 Lvmtcb to sch 2-4 wk f/u as previously requested in 05/19 tele note.

## 2023-07-14 ENCOUNTER — Other Ambulatory Visit (HOSPITAL_COMMUNITY): Payer: Self-pay

## 2023-07-14 NOTE — Telephone Encounter (Signed)
 Pharmacy Patient Advocate Encounter  Received notification from [facility] that Prior Authorization for Repatha  has been APPROVED from 07/11/23 to 07/10/24   PA #/Case ID/Reference #: [id number]

## 2023-07-17 ENCOUNTER — Telehealth: Payer: Self-pay | Admitting: Cardiology

## 2023-07-17 LAB — HM MAMMOGRAPHY

## 2023-07-17 NOTE — Telephone Encounter (Signed)
 Patient calling the office for samples of medication:   1.  What medication and dosage are you requesting samples for?   Evolocumab  (REPATHA  SURECLICK) 140 MG/ML SOAJ    2.  Are you currently out of this medication? Yes, for a week now

## 2023-07-17 NOTE — Telephone Encounter (Signed)
 Pt c/o medication issue:  1. Name of Medication: Evolocumab  (REPATHA  SURECLICK) 140 MG/ML SOAJ   2. How are you currently taking this medication (dosage and times per day)?  INJECT 140 MG INTO THE SKIN EVERY 14 (FOURTEEN) DAYS.      3. Are you having a reaction (difficulty breathing--STAT)? No  4. What is your medication issue? Pt is requesting a callback regarding her wanting MD or nurse to file appeal for this medication. Please advise.

## 2023-07-17 NOTE — Telephone Encounter (Signed)
 Pt also sent mychart message- removing duplicate encounter

## 2023-07-18 ENCOUNTER — Telehealth: Payer: Self-pay

## 2023-07-18 ENCOUNTER — Other Ambulatory Visit (HOSPITAL_COMMUNITY): Payer: Self-pay

## 2023-07-18 NOTE — Telephone Encounter (Signed)
PT following up.

## 2023-07-18 NOTE — Telephone Encounter (Signed)
 Please help.

## 2023-07-18 NOTE — Telephone Encounter (Signed)
 Please disregard the plan name. Plan names in [facility] are often different than covermymeds or insurance card. We chart the closest thing we can find based on RXBin number or type whatever comes up exactly in [facility]. Her plan is not a Medicaid plan. Per test claim Patient not eligible due to non payment of premium. Patient to contact plan. Claim is not going to pay until patient pays their insurance based on test claim rejection.

## 2023-07-18 NOTE — Telephone Encounter (Addendum)
 We have a prior authorization approval letter on file (see chart media) dated June 6th. Patient can disregard letter.

## 2023-07-18 NOTE — Telephone Encounter (Signed)
 Pharmacy Patient Advocate Encounter   Patient reached out to clinic regarding Repatha  authorization and not being able to obtain medication.  Received notification from Physician's Office that prior authorization for REPATHA  is required/requested.   Insurance verification completed.   The patient is insured through [employer] .   Per test claim: PATIENT NOT ELIGIBLE DUE TO NON PAYMENT OF PREMIUM. PATIENT TO CONTACT PLAN    Claim is not going to pay until patient pays their insurance based on test claim rejection.

## 2023-07-25 NOTE — Telephone Encounter (Signed)
 Ok to give a sample (this may be a moot point by now - sorry!)

## 2023-08-22 ENCOUNTER — Encounter: Payer: Self-pay | Admitting: Advanced Practice Midwife

## 2023-08-25 ENCOUNTER — Ambulatory Visit: Payer: Self-pay

## 2023-08-25 NOTE — Telephone Encounter (Signed)
 FYI Only or Action Required?: Action required by provider: clinical question for provider and request for documentation or forms.  Patient was last seen in primary care on 11/19/2022 by Antonio Meth, Jamee SAUNDERS, DO.  Called Nurse Triage reporting foot pain and Shoulder Pain.  Symptoms began several months ago.  Interventions attempted: OTC medications: Tylenol , ibuprofen  and Prescription medications: Hydrocodone .  Symptoms are: bilateral foot pain, right shoulder pain gradually worsening.  Triage Disposition: See PCP Within 2 Weeks  Patient/caregiver understands and will follow disposition?: Yes                Copied from CRM [id number]. Topic: Clinical - Red Word Triage >> Aug 25, 2023  3:46 PM Suzen RAMAN wrote: Red Word that prompted transfer to Nurse Triage: Difficulty walking and pain when lifting shoulder Reason for Disposition  Foot pain is a chronic symptom (recurrent or ongoing AND present > 4 weeks)  Answer Assessment - Initial Assessment Questions Patient states she was following up with disability and had X rays done thru [facility] (she states she has linked them in her chart) and would like Dr Antonio Meth to review them. Patient states she is going through mediation for a divorce and needs a write up of her health history/medical problems.   1. ONSET: When did the pain start?      X several months.  2. LOCATION: Where is the pain located?      Bilateral feet.  3. PAIN: How bad is the pain?    (Scale 1-10; or mild, moderate, severe)     8/10. She states she has taken hydrocodone , tylenol , ibuprofen .  4. WORK OR EXERCISE: Has there been any recent work or exercise that involved this part of the body?      Patient denies any over use/work or exercise. Worse when walking on her feet. Lifting right shoulder is painful.   5. CAUSE: What do you think is causing the foot pain?     She states this is a flare up of her osteoarthritis.  6. OTHER SYMPTOMS: Do  you have any other symptoms? (e.g., leg pain, rash, fever, numbness)     Right shoulder pain,numbness in bilateral toes. Patient denies swelling, rash, fever, weakness.  7. PREGNANCY: Is there any chance you are pregnant? When was your last menstrual period?     N/A.  Protocols used: Foot Pain-A-AH

## 2023-08-26 ENCOUNTER — Telehealth: Payer: Self-pay | Admitting: Cardiology

## 2023-08-26 ENCOUNTER — Encounter (HOSPITAL_BASED_OUTPATIENT_CLINIC_OR_DEPARTMENT_OTHER): Payer: Self-pay | Admitting: Family

## 2023-08-26 ENCOUNTER — Other Ambulatory Visit: Payer: Self-pay | Admitting: Family Medicine

## 2023-08-26 DIAGNOSIS — M150 Primary generalized (osteo)arthritis: Secondary | ICD-10-CM

## 2023-08-26 MED ORDER — HYDROCODONE-ACETAMINOPHEN 5-325 MG PO TABS: 1.0000 | ORAL_TABLET | Freq: Four times a day (QID) | ORAL | 0 refills | Status: DC | PRN

## 2023-08-26 NOTE — Telephone Encounter (Signed)
 Pt states that she needs a letter stating that she has been dx'd with CAD. Requesting cb

## 2023-08-26 NOTE — Telephone Encounter (Signed)
 Last refill 07/03/23 Last OV 04/30/23 with L Drubel, PA-C

## 2023-08-26 NOTE — Telephone Encounter (Unsigned)
 Copied from CRM [id number]. Topic: Clinical - Medication Refill >> Aug 26, 2023  9:02 AM Thersia BROCKS wrote: Medication: HYDROcodone -acetaminophen  (NORCO/VICODIN) 5-325 MG tablet  Has the patient contacted their pharmacy? Yes (Agent: If no, request that the patient contact the pharmacy for the refill. If patient does not wish to contact the pharmacy document the reason why and proceed with request.) (Agent: If yes, when and what did the pharmacy advise?)  This is the patient's preferred [facility]  [phone] [place], [place] - [address] [place] [address] Phone: [phone] Fax: [phone]   Is this the correct pharmacy for this prescription? Yes If no, delete pharmacy and type the correct one.   Has the prescription been filled recently? No  Is the patient out of the medication? Yes  Has the patient been seen for an appointment in the last year OR does the patient have an upcoming appointment? Yes  Can we respond through MyChart? Yes  Agent: Please be advised that Rx refills may take up to 3 business days. We ask that you follow-up with your pharmacy.

## 2023-08-26 NOTE — Telephone Encounter (Signed)
 Noted

## 2023-08-26 NOTE — Telephone Encounter (Signed)
 Requesting: hydrocodone  5-325mg   Contract: 11/19/22 UDS: 11/19/22 Last Visit: 11/19/22 Next Visit: 08/28/23 Last Refill: 07/03/23 #45 and 0RF   Please Advise

## 2023-08-28 ENCOUNTER — Ambulatory Visit (HOSPITAL_BASED_OUTPATIENT_CLINIC_OR_DEPARTMENT_OTHER)
Admission: RE | Admit: 2023-08-28 | Discharge: 2023-08-28 | Disposition: A | Discharge status: Home / Self Care | Source: Ambulatory Visit | Attending: Family Medicine | Admitting: Family Medicine

## 2023-08-28 ENCOUNTER — Ambulatory Visit: Admitting: Family Medicine

## 2023-08-28 ENCOUNTER — Encounter: Payer: Self-pay | Admitting: Family Medicine

## 2023-08-28 VITALS — BP 112/80 | HR 78 | Temp 98.1°F | Resp 16 | Ht 64.0 in | Wt 161.6 lb

## 2023-08-28 DIAGNOSIS — M150 Primary generalized (osteo)arthritis: Secondary | ICD-10-CM | POA: Insufficient documentation

## 2023-08-28 DIAGNOSIS — M25511 Pain in right shoulder: Secondary | ICD-10-CM

## 2023-08-28 DIAGNOSIS — R5383 Other fatigue: Secondary | ICD-10-CM | POA: Diagnosis not present

## 2023-08-28 DIAGNOSIS — M2550 Pain in unspecified joint: Secondary | ICD-10-CM | POA: Insufficient documentation

## 2023-08-28 DIAGNOSIS — G8929 Other chronic pain: Secondary | ICD-10-CM | POA: Insufficient documentation

## 2023-08-28 LAB — COMPREHENSIVE METABOLIC PANEL WITH GFR
ALT: 16 U/L (ref 0–35)
AST: 18 U/L (ref 0–37)
Albumin: 4.3 g/dL (ref 3.5–5.2)
Alkaline Phosphatase: 56 U/L (ref 39–117)
BUN: 16 mg/dL (ref 6–23)
CO2: 30 meq/L (ref 19–32)
Calcium: 9.1 mg/dL (ref 8.4–10.5)
Chloride: 102 meq/L (ref 96–112)
Creatinine, Ser: 0.8 mg/dL (ref 0.40–1.20)
GFR: 78.88 mL/min (ref >60.00)
Glucose, Bld: 102 mg/dL — ABNORMAL HIGH (ref 70–99)
Potassium: 3.7 meq/L (ref 3.5–5.1)
Sodium: 141 meq/L (ref 135–145)
Total Bilirubin: 0.6 mg/dL (ref 0.2–1.2)
Total Protein: 6.4 g/dL (ref 6.0–8.3)

## 2023-08-28 LAB — CBC WITH DIFFERENTIAL/PLATELET
Basophils Absolute: 0 K/uL (ref 0.0–0.1)
Basophils Relative: 1.1 % (ref 0.0–3.0)
Eosinophils Absolute: 0.1 K/uL (ref 0.0–0.7)
Eosinophils Relative: 2.7 % (ref 0.0–5.0)
HCT: 41.7 % (ref 36.0–46.0)
Hemoglobin: 14 g/dL (ref 12.0–15.0)
Lymphocytes Relative: 22.7 % (ref 12.0–46.0)
Lymphs Abs: 0.9 K/uL (ref 0.7–4.0)
MCHC: 33.5 g/dL (ref 30.0–36.0)
MCV: 89.6 fl (ref 78.0–100.0)
Monocytes Absolute: 0.3 K/uL (ref 0.1–1.0)
Monocytes Relative: 6.3 % (ref 3.0–12.0)
Neutro Abs: 2.7 K/uL (ref 1.4–7.7)
Neutrophils Relative %: 67.2 % (ref 43.0–77.0)
Platelets: 189 K/uL (ref 150.0–400.0)
RBC: 4.65 Mil/uL (ref 3.87–5.11)
RDW: 13.9 % (ref 11.5–15.5)
WBC: 4 K/uL (ref 4.0–10.5)

## 2023-08-28 LAB — VITAMIN B12: Vitamin B-12: 389 pg/mL (ref 211–911)

## 2023-08-28 LAB — SEDIMENTATION RATE: Sed Rate: 5 mm/h (ref 0–30)

## 2023-08-28 LAB — TSH: TSH: 1.81 u[IU]/mL (ref 0.35–5.50)

## 2023-08-28 LAB — VITAMIN D 25 HYDROXY (VIT D DEFICIENCY, FRACTURES): VITD: 40.66 ng/mL (ref 30.00–100.00)

## 2023-08-28 NOTE — Assessment & Plan Note (Signed)
 Hx OA Check labs

## 2023-08-28 NOTE — Assessment & Plan Note (Signed)
 Xray feet and hands  Check labs  Consider rheum referral

## 2023-08-28 NOTE — Progress Notes (Deleted)
 Office Visit Note   Patient: Sonya [name]           Date of Birth: [birth year]           MRN: [id number] Visit Date: 08/28/2023              Requested by: [address], [place], [place] [address] [place],  [place] [address] PCP: Antonio Meth, Jamee SAUNDERS, DO   Assessment & Plan: Visit Diagnoses:  1. Multiple joint pain   2. Chronic right shoulder pain   3. Other fatigue   4. Primary osteoarthritis involving multiple joints     Plan: ***  Follow-Up Instructions: No follow-ups on file.   Orders:  Orders Placed This Encounter  Procedures  . DG Foot Complete Left  . DG Foot Complete Right  . DG Hand Complete Left  . DG Hand Complete Right  . Rheumatoid Factor  . Antinuclear Antib (ANA)  . Sedimentation rate  . CBC with Differential/Platelet  . Comprehensive metabolic panel with GFR  . TSH  . Vitamin B12  . VITAMIN D  25 Hydroxy (Vit-D Deficiency, Fractures)  . Ambulatory referral to Orthopedic Surgery  . Ambulatory referral to Physical Therapy   No orders of the defined types were placed in this encounter.     Procedures: No procedures performed   Clinical Data: No additional findings.   Subjective: Chief Complaint  Patient presents with  . Foot Pain  . Shoulder Pain    HPI  Review of Systems   Objective: Vital Signs: BP 112/80 (BP Location: Left Arm, Patient Position: Sitting, Cuff Size: Normal)   Pulse 78   Temp 98.1 F (36.7 C) (Oral)   Resp 16   Ht 5' 4 (1.626 m)   Wt 161 lb 9.6 oz (73.3 kg)   LMP 02/23/2016   SpO2 98%   BMI 27.74 kg/m   Physical Exam  Ortho Exam  Specialty Comments:  No specialty comments available.  Imaging: No results found.   PMFS History: Patient Active Problem List   Diagnosis Date Noted  . Multiple joint pain 08/28/2023  . Chronic right shoulder pain 08/28/2023  . Primary osteoarthritis involving multiple joints 08/28/2023  . Other fatigue 08/28/2023  . Primary osteoarthritis of both  hands 06/20/2019  . Primary osteoarthritis of both knees 06/20/2019  . Primary osteoarthritis of both feet 06/20/2019  . Agatston coronary artery calcium  score greater than 400 04/20/2018  . 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 Family history of heart disease 12/26/2017  . Statin intolerance 12/26/2017  . Preventative health care 09/28/2017  . Arthritis 09/28/2017  . Essential hypertension 05/14/2016  . Generalized anxiety disorder 05/14/2016   Past Medical History:  Diagnosis Date  . Arthritis   . Fibromyalgia   . Hypertension     Family History  Problem Relation Age of Onset  . Arthritis Mother   . Hyperlipidemia Mother   . Hypertension Mother   . Diabetes Mother   . Cancer - Other Mother        vulvar  . Aortic stenosis Mother   . Lung cancer Mother   . Cancer - Lung Mother   . Arthritis Father   . Hyperlipidemia Father   . Heart failure Father        Hx disease  . Hypertension Father   . Stroke Father   . Prostate cancer Father   . Macular degeneration Father   . Alzheimer's disease Father   . Dementia Father   .  Alcohol abuse Sister   . Subarachnoid hemorrhage Sister   . Heart attack Brother   . Stomach cancer Maternal Grandmother   . Stroke Paternal Grandfather   . Lung cancer Paternal Grandfather        smoker  . Healthy Son   . Healthy Son     Past Surgical History:  Procedure Laterality Date  . CESAREAN SECTION  1987   Social History   Occupational History  . Occupation: hair dresser  Tobacco Use  . Smoking status: Never  . Smokeless tobacco: Never  Vaping Use  . Vaping status: Never Used  Substance and Sexual Activity  . Alcohol use: No  . Drug use: No  . Sexual activity: Not Currently    Birth control/protection: None
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
## 2023-08-28 NOTE — Patient Instructions (Signed)
 Joint Pain  Joint pain can be caused by many things. It may go away if you follow instructions from your health care provider for taking care of yourself at home. Sometimes, you may need more treatment. Joint pain can be caused by: Bruises at the area of the joint. An injury caused by movements that are repeated. Wear and tear on the joint as you get older. Buildup of uric acid crystals in the joint. This is also called gout. Irritation and swelling of the joint. Types of arthritis. Infections of the joint or of the bone. Your provider may tell you to take pain medicine or wear an elastic bandage, sling, or splint. If your joint pain continues, you may need lab or imaging tests to find the cause of your joint pain. Follow these instructions at home: If you have an elastic bandage, sling, or splint that can be taken off: Wear the bandage, sling, or splint as told by your provider. Take it off only if your provider says you can. Check the skin under and around it every day. Tell your provider if you see problems. Loosen it if your fingers or toes tingle, are numb, or turn cold and blue. Keep it clean and dry. Ask your provider if you should remove it before bathing. If the bandage, sling, or splint is not waterproof: Do not let it get wet. Cover it when you take a bath or shower. Use a cover that does not let any water in. Managing pain, stiffness, and swelling     If told, put ice on the area. If you have an elastic bandage, sling, or splint that you can take off, remove it as told. Put ice in a plastic bag. Place a towel between your skin and the bag. Leave the ice on for 20 minutes, 2-3 times a day. If told, put heat on the area. Do this as often as told. Use the heat source that your provider recommends, such as a moist heat pack or a heating pad. Place a towel between your skin and the heat source. Leave the heat on for 20-30 minutes. If your skin turns bright red, take off the  ice or heat right away to prevent skin damage. The risk of damage is higher if you can't feel pain, heat, or cold. Move your fingers or toes often to reduce stiffness and swelling. Raise the injured area above the level of your heart while you're sitting or lying down. Use a pillow to support the painful area as needed. Activity Rest the painful joint as told. Do not do things that cause pain or make pain worse. Begin exercising or stretching the affected area as told by your provider. Return to normal activities when you are told. Ask what things are safe for you to do. General instructions Take your medicines as told by your provider. Treatment may include medicines for pain and swelling that are taken by mouth or applied to the skin. Do not smoke, vape, or use products with nicotine or tobacco in them. If you need help quitting, talk with your provider. Keep all follow-up visits. Your provider will want to check on your condition. Contact a health care provider if: You have pain that does not get better with medicine. Your joint pain does not improve within 3 days. You have more bruising or swelling. You have a fever. You lose 10 lb (4.5 kg) or more without trying. Get help right away if: You cannot move the joint. Your fingers  or toes tingle, become numb, or turn cold and blue. You have a fever along with a joint that's red, warm, and swollen. This information is not intended to replace advice given to you by your health care provider. Make sure you discuss any questions you have with your health care provider. Document Revised: 10/24/2022 Document Reviewed: 04/05/2022 Elsevier Patient Education  2024 [employer].

## 2023-08-29 LAB — ANA: Anti Nuclear Antibody (ANA): NEGATIVE

## 2023-08-29 LAB — RHEUMATOID FACTOR: Rheumatoid fact SerPl-aCnc: <10 [IU]/mL (ref <14)

## 2023-08-31 ENCOUNTER — Ambulatory Visit: Payer: Self-pay | Admitting: Family Medicine

## 2023-09-02 ENCOUNTER — Telehealth: Payer: Self-pay | Admitting: Cardiology

## 2023-09-02 ENCOUNTER — Ambulatory Visit: Admitting: Family Medicine

## 2023-09-02 NOTE — Telephone Encounter (Addendum)
 Need to send labs to [facility] in order receive medications.  She dropped off labs and will print and send to fax [phone].  She will have labs at her next appointment in September.  BP 112/80 at her PCP

## 2023-09-02 NOTE — Telephone Encounter (Signed)
 Pt c/o medication issue:  1. Name of Medication: Evolocumab  (REPATHA  SURECLICK) 140 MG/ML SOAJ   2. How are you currently taking this medication (dosage and times per day)? As written  3. Are you having a reaction (difficulty breathing--STAT)? No   4. What is your medication issue? Pt's ins is requesting info that the  repatha  is working to refill. Please

## 2023-10-08 ENCOUNTER — Other Ambulatory Visit: Payer: Self-pay | Admitting: Family Medicine

## 2023-10-08 DIAGNOSIS — G4700 Insomnia, unspecified: Secondary | ICD-10-CM

## 2023-10-08 DIAGNOSIS — F418 Other specified anxiety disorders: Secondary | ICD-10-CM

## 2023-10-08 NOTE — Telephone Encounter (Signed)
 11/19/22 90 tabs/1 RF

## 2023-10-08 NOTE — Telephone Encounter (Unsigned)
 Copied from CRM [id number]. Topic: Clinical - Medication Refill >> Oct 08, 2023  1:00 PM Drema MATSU wrote: Medication: LORazepam  (ATIVAN ) 0.5 MG tablet  Has the patient contacted their pharmacy? Yes (Agent: If no, request that the patient contact the pharmacy for the refill. If patient does not wish to contact the pharmacy document the reason why and proceed with request.) call provider (Agent: If yes, when and what did the pharmacy advise?)  This is the patient's preferred pharmacy:  [facility] [phone] [address], [place] - [address] [place] [address] Phone: [phone] Fax: [phone]   Is this the correct pharmacy for this prescription? Yes If no, delete pharmacy and type the correct one.   Has the prescription been filled recently? No  Is the patient out of the medication? Yes  Has the patient been seen for an appointment in the last year OR does the patient have an upcoming appointment? Yes  Can we respond through MyChart? Yes  Agent: Please be advised that Rx refills may take up to 3 business days. We ask that you follow-up with your pharmacy.

## 2023-10-08 NOTE — Telephone Encounter (Signed)
 Requesting: lorazepam  0.5mg   Contract: 11/19/22 UDS: 11/19/22 Last Visit: 08/28/23 Next Visit: 11/21/23 Last Refill: 11/19/22 #90 and 1RF   Please Advise

## 2023-10-09 MED ORDER — LORAZEPAM 0.5 MG PO TABS: 0.5000 mg | ORAL_TABLET | Freq: Four times a day (QID) | ORAL | 1 refills | Status: DC | PRN

## 2023-10-17 ENCOUNTER — Other Ambulatory Visit: Payer: Self-pay | Admitting: Family Medicine

## 2023-10-17 DIAGNOSIS — I10 Essential (primary) hypertension: Secondary | ICD-10-CM

## 2023-10-25 ENCOUNTER — Ambulatory Visit (HOSPITAL_BASED_OUTPATIENT_CLINIC_OR_DEPARTMENT_OTHER): Attending: Family Medicine | Admitting: Physical Therapy

## 2023-10-25 ENCOUNTER — Encounter (HOSPITAL_BASED_OUTPATIENT_CLINIC_OR_DEPARTMENT_OTHER): Payer: Self-pay | Admitting: Physical Therapy

## 2023-10-25 ENCOUNTER — Other Ambulatory Visit: Payer: Self-pay

## 2023-10-25 DIAGNOSIS — R2689 Other abnormalities of gait and mobility: Secondary | ICD-10-CM | POA: Diagnosis present

## 2023-10-25 DIAGNOSIS — M79671 Pain in right foot: Secondary | ICD-10-CM | POA: Diagnosis present

## 2023-10-25 DIAGNOSIS — R29898 Other symptoms and signs involving the musculoskeletal system: Secondary | ICD-10-CM | POA: Diagnosis present

## 2023-10-25 DIAGNOSIS — M150 Primary generalized (osteo)arthritis: Secondary | ICD-10-CM | POA: Diagnosis not present

## 2023-10-25 DIAGNOSIS — M6281 Muscle weakness (generalized): Secondary | ICD-10-CM | POA: Insufficient documentation

## 2023-10-25 DIAGNOSIS — M25511 Pain in right shoulder: Secondary | ICD-10-CM | POA: Diagnosis present

## 2023-10-25 DIAGNOSIS — M79672 Pain in left foot: Secondary | ICD-10-CM | POA: Insufficient documentation

## 2023-10-25 NOTE — Therapy (Signed)
 OUTPATIENT PHYSICAL THERAPY  EVALUATION   Patient Name: Sonya [name] MRN: [id number] DOB:[birth year], 63 y.o., female Today's Date: 10/25/2023  END OF SESSION:  PT End of Session - 10/25/23 1041     Visit Number 1    Number of Visits 16    Date for Recertification  12/20/23    Authorization Type [employer] NEXT    PT Start Time 1041    PT Stop Time 1118    PT Time Calculation (min) 37 min    Activity Tolerance Patient tolerated treatment well    Behavior During Therapy [facility] for tasks assessed/performed          Past Medical History:  Diagnosis Date   Arthritis    Fibromyalgia    Hypertension    Past Surgical History:  Procedure Laterality Date   CESAREAN SECTION  1987   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   Family history of heart disease 12/26/2017   Statin intolerance 12/26/2017   Preventative health care 09/28/2017   Arthritis 09/28/2017   Essential hypertension 05/14/2016   Generalized anxiety disorder 05/14/2016    PCP: Antonio Cyndee Jamee JONELLE, DO  REFERRING PROVIDER: Antonio Cyndee Jamee JONELLE, DO  REFERRING DIAG: M15.0 (ICD-10-CM) - Primary osteoarthritis involving multiple joints  Rationale for Evaluation and Treatment: Rehabilitation  THERAPY DIAG:  Muscle weakness (generalized)  Other abnormalities of gait and mobility  Other symptoms and signs involving the musculoskeletal system  Right shoulder pain, unspecified chronicity  Bilateral foot pain  ONSET DATE: Chronic  SUBJECTIVE:                                                                                                                                                                                            SUBJECTIVE STATEMENT: Patient states R arm pain. She states both feet are painful. Pain with laying on shoulder and difficulty sleeping because of it. Patient states R shoulder acute on chronic pain which was irritated in April. Pain with lifting arm.   PERTINENT HISTORY:  Hx HTN, fibromyalgia, OA of multiple joints, CAD  PAIN:  Are you having pain? Yes: NPRS scale: 1/10 at rest, 7/10 with movement Pain location: R shoulder Pain description: stabbing/aching Aggravating factors: movement Relieving  factors: none  PRECAUTIONS: None  WEIGHT BEARING RESTRICTIONS: No  FALLS:  Has patient fallen in last 6 months? No  OCCUPATION: hair dresser  PLOF: Independent  PATIENT GOALS: be able to use arm, decrease pain; be able to walk on feet   OBJECTIVE: (objective measures from initial evaluation unless otherwise dated)  PATIENT SURVEYS:  Quick Dash:  QUICK DASH  Please rate your ability do the following activities in the last week by selecting the number below the appropriate response.   Activities Rating  Open a tight or new jar.  5 = Unable  Do heavy household chores (e.g., wash walls, floors). 4 = Severe difficulty  Carry a shopping bag or briefcase 3 = Moderate difficulty  Wash your back. 4 = Severe difficulty  Use a knife to cut food. 3 = Moderate difficulty  Recreational activities in which you take some force or impact through your arm, shoulder or hand (e.g., golf, hammering, tennis, etc.). 4 = Severe difficulty  During the past week, to what extent has your arm, shoulder or hand problem interfered with your normal social activities with family, friends, neighbors or groups?  1 = Not at all  During the past week, were you limited in your work or other regular daily activities as a result of your arm, shoulder or hand  problem? 4 = Very limited  Rate the severity of the following symptoms in the last week: Arm, Shoulder, or hand pain. 5 = Extreme  Rate the severity of the following symptoms in the last week: Tingling (pins and needles) in your arm, shoulder or hand. 1 = none  During the past week, how much difficulty have you had sleeping because of the pain in your arm, shoulder or hand?  5 = So much difficulty that I can't sleep   (A QuickDASH score may not be calculated if there is greater than 1 missing item.)  Quick Dash Disability/Symptom Score:63.6%  Minimally Clinically Important Difference (MCID): 15-20 points  (Franchignoni, F. et al. (2013). Minimally clinically important difference of the disabilities of the arm, shoulder, and hand outcome measures (DASH) and its shortened version (Quick DASH). Journal of Orthopaedic & Sports Physical Therapy, 44(1), 30-39)   COGNITION: Overall cognitive status: Within functional limits for tasks assessed     SENSATION: WFL for UE   POSTURE: rounded shoulders and forward head  PALPATION: Hypermobile AP/PA bilateral R>L , grossly TTP R shoulder  UPPER EXTREMITY ROM:   Active ROM Right 10/25/2023 Left 10/25/2023  Shoulder flexion WFL * with compensation   Shoulder extension    Shoulder abduction    Shoulder adduction    Shoulder internal rotation functional  T8* T5  Shoulder external rotation T4* T4  Elbow flexion    Elbow extension    Wrist flexion    Wrist extension    Wrist ulnar deviation    Wrist radial deviation    Wrist pronation    Wrist supination    (Blank rows = not tested) *= pain/symptoms  UPPER EXTREMITY MMT:  MMT Right 10/25/2023 Left 10/25/2023  Shoulder flexion 3+* 4-  Shoulder extension    Shoulder abduction    Shoulder adduction 3+* 4-  Shoulder internal rotation 4+* 5  Shoulder external rotation 3* 4+  Middle trapezius    Lower trapezius    Elbow flexion 4+ 5  Elbow extension 4* 5  Wrist flexion    Wrist  extension    Wrist ulnar deviation    Wrist radial deviation  Wrist pronation    Wrist supination    Grip strength (lbs)    (Blank rows = not tested) *= pain/symptoms   LOWER EXTREMITY ROM:   Test in upcoming session  Active  Right eval Left eval  Hip flexion    Hip extension    Hip abduction    Hip adduction    Hip internal rotation    Hip external rotation    Knee flexion    Knee extension    Ankle dorsiflexion    Ankle plantarflexion    Ankle inversion    Ankle eversion     (Blank rows = not tested) * = pain/symptoms  LOWER EXTREMITY MMT:  Test in upcoming session  MMT Right eval Left eval  Hip flexion    Hip extension    Hip abduction    Hip adduction    Hip internal rotation    Hip external rotation    Knee flexion    Knee extension    Ankle dorsiflexion    Ankle plantarflexion    Ankle inversion    Ankle eversion     (Blank rows = not tested) * = pain/symptoms    GAIT: Distance walked: 100 feet Assistive device utilized: None Level of assistance: Complete Independence Comments:   TODAY'S TREATMENT:                                                                                                                              DATE:  10/25/23  Eval, education, and HEP Supine AAROM flexion well arm assisted Seated scap retraction    PATIENT EDUCATION:  Education details: Patient educated on exam findings, POC, scope of PT, HEP, and relevant anatomy/biomechanics. Person educated: Patient Education method: Explanation, Demonstration, and Handouts Education comprehension: verbalized understanding, returned demonstration, verbal cues required, and tactile cues required  HOME EXERCISE PROGRAM: Access Code: [email or web address] URL: [email or web address] Date: 10/25/2023 Prepared by: Prentice Samanvitha Germany  Exercises - Supine Shoulder Flexion AAROM with Hands Clasped  - 2-3 x daily - 7 x weekly - 2 sets - 10 reps - Seated Scapular Retraction  - 2-3 x  daily - 7 x weekly - 2 sets - 10 reps  ASSESSMENT:  CLINICAL IMPRESSION: Patient a 63 y.o. y.o. female who was seen today for physical therapy evaluation and treatment for R shoulder and bilateral feet pain. Patient presents with pain limited deficits in R shoulder and bilateral feet strength, ROM, endurance, activity tolerance, and functional mobility with ADL. Patient is having to modify and restrict ADL as indicated by outcome measure score as well as subjective information and objective measures which is affecting overall participation. Patient will benefit from skilled physical therapy in order to improve function and reduce impairment.  OBJECTIVE IMPAIRMENTS: Abnormal gait, decreased activity tolerance, decreased balance, decreased endurance, decreased mobility, difficulty walking, decreased ROM, decreased strength, hypomobility, increased muscle spasms, impaired flexibility, impaired UE functional use, improper body mechanics, postural dysfunction, and pain  ACTIVITY LIMITATIONS: carrying,  lifting, bending, standing, squatting, stairs, transfers, bed mobility, reach over head, locomotion level, and caring for others  PARTICIPATION LIMITATIONS:  meal prep, cleaning, laundry, shopping, community activity, occupation, and yard work  PERSONAL FACTORS: Time since onset of injury/illness/exacerbation and 3+ comorbidities: Hx HTN, fibromyalgia, OA of multiple joints, CAD are also affecting patient's functional outcome.   REHAB POTENTIAL: Fair    CLINICAL DECISION MAKING: Evolving/moderate complexity  EVALUATION COMPLEXITY: Moderate   GOALS: Goals reviewed with patient? Yes  SHORT TERM GOALS: Target date: 11/22/2023    Patient will be independent with HEP in order to improve functional outcomes. Baseline:  Goal status: INITIAL  2.  Patient will report at least 25% improvement in symptoms for improved quality of life. Baseline:  Goal status: INITIAL    LONG TERM GOALS: Target  date: 12/20/2023    Patient will report at least 75% improvement in symptoms for improved quality of life. Baseline:  Goal status: INITIAL  2.  Patient will improve quick dash score by at least 20% in order to indicate improved tolerance to activity. Baseline:  Goal status: INITIAL  3.  Patient will demonstrate R shoulder AROM with pain less than 2/10 for improved mobility/lifting. Baseline:  Goal status: INITIAL  4.   Patient will demonstrate grade of 4+/5 MMT grade in all UE tested musculature as evidence of improved strength to assist with lifting at home.  Baseline:  Goal status: INITIAL  5.  Patient will demonstrate grade of 4+/5 MMT grade in all LE tested musculature as evidence of improved strength to assist with stair ambulation and gait.   Baseline:  Goal status: INITIAL     PLAN:  PT FREQUENCY: 2x/week  PT DURATION: 8 weeks  PLANNED INTERVENTIONS: 97164- PT Re-evaluation, 97110-Therapeutic exercises, 97530- Therapeutic activity, 97112- Neuromuscular re-education, 97535- Self Care, [id number]- Manual therapy, [phone]- Gait training, [phone]- Orthotic Fit/training, [phone]- Canalith repositioning, [id number]- Aquatic Therapy, [phone]- Splinting, [phone]- Wound care (first 20 sq cm), 97598- Wound care (each additional 20 sq cm)Patient/Family education, Balance training, Stair training, Taping, Dry Needling, Joint mobilization, Joint manipulation, Spinal manipulation, Spinal mobilization, Scar mobilization, and DME instructions.  PLAN FOR NEXT SESSION: R shoulder and periscap strength, assess bilateral feet pain and bilateral LE when able, trial aquatics when able    [employer], PT 10/25/2023, 11:31 AM

## 2023-10-29 ENCOUNTER — Ambulatory Visit (HOSPITAL_BASED_OUTPATIENT_CLINIC_OR_DEPARTMENT_OTHER): Admitting: Cardiology

## 2023-10-29 ENCOUNTER — Encounter (HOSPITAL_BASED_OUTPATIENT_CLINIC_OR_DEPARTMENT_OTHER): Payer: Self-pay | Admitting: Cardiology

## 2023-10-29 VITALS — BP 120/79 | HR 84 | Resp 17 | Ht 64.0 in | Wt 164.0 lb

## 2023-10-29 DIAGNOSIS — I2510 Atherosclerotic heart disease of native coronary artery without angina pectoris: Secondary | ICD-10-CM | POA: Diagnosis not present

## 2023-10-29 DIAGNOSIS — E7841 Elevated Lipoprotein(a): Secondary | ICD-10-CM

## 2023-10-29 DIAGNOSIS — I10 Essential (primary) hypertension: Secondary | ICD-10-CM

## 2023-10-29 DIAGNOSIS — E7849 Other hyperlipidemia: Secondary | ICD-10-CM

## 2023-10-29 DIAGNOSIS — R0609 Other forms of dyspnea: Secondary | ICD-10-CM

## 2023-10-29 DIAGNOSIS — Z789 Other specified health status: Secondary | ICD-10-CM

## 2023-10-29 DIAGNOSIS — Z8249 Family history of ischemic heart disease and other diseases of the circulatory system: Secondary | ICD-10-CM

## 2023-10-29 NOTE — Progress Notes (Signed)
 Cardiology Office Note:  .    Date:  10/29/2023  ID:  Sonya [name], DOB 1960/03/22, MRN [id number] PCP: Antonio Meth, Jamee SAUNDERS, DO  [facility] Providers Cardiologist:  Shelda Bruckner, MD     History of Present Illness: .    Sonya [name] is a 63 y.o. female with a hx of hypertension, hyperlipidemia, fibromyalgia, arthritis, here for follow-up. Previously followed by Dr. Mona.  CV history: familial hyperlipidemia per Dr. Mona. LPA 11/2021 was elevated at 452. Diagnosed with elevated  in her 20's; prior intolerance of atorvastatin , pravastatin , Livalo , and Zetia . Currently on Repatha  and bempedoic acid  180 mg which she is tolerating.  Family history: Her mother had aortic stenosis. Her father had coronary artery disease and congestive heart failure. Her brother died of an MI at age 26.  Prior pertinent testing and/or incidental findings: Coronary CT 01/2018 showed multivessel coronary calcification with a calcium  score of 633.   Today: Feels like it takes her 3-4 hours in the morning before she can get going--doesn't have energy. Also feels more short of breath on exertion. Sometimes she has to stop working in the yard due to fatigue/shortness of breath. Doesn't have a complete inability to push through symptoms, but stops when she feels a little limited.   We spent extensive time today reviewing her symptoms. The fatigue at rest is unclear in etiology, but the shortness of breath on exertion is concerning for anginal equivalent. We discussed options for further evaluation, including nuclear/PET stress, coronary CT, or stress echo. Discussed pros/cons of all of these  Tolerating amlodipine . Blood pressure has been well controlled on her home numbers.  ROS:  Denies chest pain, shortness of breath at rest. No PND, orthopnea, LE edema or unexpected weight gain. No syncope or palpitations. ROS otherwise negative except as noted.   Studies Reviewed: SABRA    EKG  Interpretation Date/Time:  Wednesday October 29 2023 16:41:14 EDT Ventricular Rate:  83 PR Interval:  172 QRS Duration:  94 QT Interval:  368 QTC Calculation: 432 R Axis:   80  Text Interpretation: Normal sinus rhythm with sinus arrhythmia Nonspecific T wave abnormality Confirmed by Bruckner Shelda [phone]) on 10/29/2023 9:04:06 PM     Physical Exam:    VS:  BP 120/79 (BP Location: Right Arm, Patient Position: Sitting, Cuff Size: Normal)   Pulse 84   Resp 17   Ht 5' 4 (1.626 m)   Wt 164 lb (74.4 kg)   LMP 02/23/2016   SpO2 92%   BMI 28.15 kg/m    Wt Readings from Last 3 Encounters:  10/29/23 164 lb (74.4 kg)  08/28/23 161 lb 9.6 oz (73.3 kg)  04/30/23 161 lb 12.8 oz (73.4 kg)    GEN: Well nourished, well developed in no acute distress HEENT: Normal, moist mucous membranes NECK: No JVD CARDIAC: regular rhythm, normal S1 and S2, no rubs or gallops. No murmur. VASCULAR: Radial and DP pulses 2+ bilaterally. No carotid bruits RESPIRATORY:  Clear to auscultation without rales, wheezing or rhonchi  ABDOMEN: Soft, non-tender, non-distended MUSCULOSKELETAL:  Ambulates independently SKIN: Warm and dry, no edema NEUROLOGIC:  Alert and oriented x 3. No focal neuro deficits noted. PSYCHIATRIC:  Normal affect   ASSESSMENT AND PLAN: .    Fatigue: not exertional, worst first thing in the morning, not completely limiting. Has upcoming visit with PCP, recommend discussing further. If no other clear etiology, would discuss evaluation for sleep apnea  Dyspnea on exertion -we discussed evaluation at length  today, see above -discussed coronary CT, cardiac PET, and stress echo (with prior baseline echo) at length today, including reviewing pros/cons, risk/benefit of each today -after shared decision making, she will work on gradually increasing activity, monitoring symptoms -if she has limitations/worsening exertional symptoms, she will contact me with her preference on further  evaluation based on what she feels will give her the most peace of mind (we extensively discussed this for each test) -reviewed red flag warning signs that need immediate medical attention  Familial hypercholesterolemia Elevated Lp(a) Coronary calcification consistent with nonobstructive CAD Statin intolerance -doing well on repatha  and ezetimibe  -reviewed scanned labs from 05/27/23. LDL 97, lp(a) >240 (lab cutoff) despite PCSK9i -was on nexletol , could not afford after her insurance changed. Would reassess after her new insurance year starts to see if it will be covered.  CV risk counseling and prevention -recommend heart healthy/Mediterranean diet, with whole grains, fruits, vegetable, fish, lean meats, nuts, and olive oil. Limit salt. -recommend moderate walking, 3-5 times/week for 30-50 minutes each session. Aim for at least 150 minutes.week. Goal should be pace of 3 miles/hours, or walking 1.5 miles in 30 minutes -recommend avoidance of tobacco products. Avoid excess alcohol. -ASCVD risk score: The 10-year ASCVD risk score (Arnett DK, et al., 2019) is: 4%   Values used to calculate the score:     Age: 7 years     Clincally relevant sex: Female     Is Non-Hispanic African American: No     Diabetic: No     Tobacco smoker: No     Systolic Blood Pressure: 120 mmHg     Is BP treated: Yes     HDL Cholesterol: 66.7 mg/dL     Total Cholesterol: 178 mg/dL    Dispo: 3 mos or sooner as needed  Total time of encounter: I spent 63 minutes dedicated to the care of this patient on the date of this encounter to include pre-visit review of records, face-to-face time with the patient discussing conditions above, and clinical documentation with the electronic health record. We specifically spent time today discussing symptoms, options for testing extensively, see above.   Signed, Shelda Bruckner, MD

## 2023-10-29 NOTE — Patient Instructions (Signed)
 Medication Instructions:  Your physician recommends that you continue on your current medications as directed. Please refer to the Current Medication list given to you today.  *If you need a refill on your cardiac medications before your next appointment, please call your pharmacy*  Lab Work: NONE  Testing/Procedures: NONE  Follow-Up: 02/06/2024 at 4:20 pm with Dr Lonni

## 2023-10-31 ENCOUNTER — Other Ambulatory Visit: Payer: Self-pay | Admitting: Pharmacist Clinician (PhC)/ Clinical Pharmacy Specialist

## 2023-10-31 DIAGNOSIS — E7849 Other hyperlipidemia: Secondary | ICD-10-CM

## 2023-10-31 DIAGNOSIS — Z789 Other specified health status: Secondary | ICD-10-CM

## 2023-10-31 MED ORDER — REPATHA SURECLICK 140 MG/ML ~~LOC~~ SOAJ: 140.0000 mg | SUBCUTANEOUS | 0 refills | Status: AC

## 2023-11-03 NOTE — Therapy (Deleted)
 OUTPATIENT PHYSICAL THERAPY  TREATMENT   Patient Name: Sonya [name] MRN: [id number] DOB:[birth year], 63 y.o., female Today's Date: 11/03/2023  END OF SESSION:    Past Medical History:  Diagnosis Date   Arthritis    Fibromyalgia    Hypertension    Past Surgical History:  Procedure Laterality Date   CESAREAN SECTION  1987   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   Family history of heart disease 12/26/2017   Statin intolerance 12/26/2017   Preventative health care 09/28/2017   Arthritis 09/28/2017   Essential hypertension 05/14/2016   Generalized anxiety disorder 05/14/2016    PCP: Antonio Cyndee Jamee JONELLE, DO  REFERRING PROVIDER: Antonio Cyndee Jamee JONELLE, DO  REFERRING DIAG: M15.0 (ICD-10-CM) - Primary osteoarthritis involving multiple joints  Rationale for Evaluation and Treatment: Rehabilitation  THERAPY DIAG:  No diagnosis found.  ONSET DATE: Chronic  SUBJECTIVE:                                                                                                                                                                                           SUBJECTIVE STATEMENT: Patient states R arm pain. She states both feet are painful. Pain with laying on shoulder and difficulty sleeping because of it. Patient states R shoulder acute on chronic pain which was irritated in April. Pain with lifting arm.   PERTINENT HISTORY:  Hx HTN, fibromyalgia, OA of multiple joints, CAD  PAIN:  Are you having pain? Yes: NPRS scale: 1/10 at rest, 7/10 with  movement Pain location: R shoulder Pain description: stabbing/aching Aggravating factors: movement Relieving factors: none  PRECAUTIONS: None  WEIGHT BEARING RESTRICTIONS: No  FALLS:  Has patient fallen in last 6 months? No  OCCUPATION: hair dresser  PLOF: Independent  PATIENT GOALS: be able to use arm, decrease pain; be able to walk on feet   OBJECTIVE: (objective measures from initial evaluation unless otherwise dated)  PATIENT SURVEYS:  Quick Dash:  QUICK DASH  Please rate your ability do the following activities in the last week by selecting the number below the appropriate response.   Activities Rating  Open a tight or new jar.  5 = Unable  Do heavy household chores (e.g., wash walls, floors). 4 = Severe  difficulty  Carry a shopping bag or briefcase 3 = Moderate difficulty  Wash your back. 4 = Severe difficulty  Use a knife to cut food. 3 = Moderate difficulty  Recreational activities in which you take some force or impact through your arm, shoulder or hand (e.g., golf, hammering, tennis, etc.). 4 = Severe difficulty  During the past week, to what extent has your arm, shoulder or hand problem interfered with your normal social activities with family, friends, neighbors or groups?  1 = Not at all  During the past week, were you limited in your work or other regular daily activities as a result of your arm, shoulder or hand problem? 4 = Very limited  Rate the severity of the following symptoms in the last week: Arm, Shoulder, or hand pain. 5 = Extreme  Rate the severity of the following symptoms in the last week: Tingling (pins and needles) in your arm, shoulder or hand. 1 = none  During the past week, how much difficulty have you had sleeping because of the pain in your arm, shoulder or hand?  5 = So much difficulty that I can't sleep   (A QuickDASH score may not be calculated if there is greater than 1 missing item.)  Quick Dash Disability/Symptom  Score:63.6%  Minimally Clinically Important Difference (MCID): 15-20 points  (Franchignoni, F. et al. (2013). Minimally clinically important difference of the disabilities of the arm, shoulder, and hand outcome measures (DASH) and its shortened version (Quick DASH). Journal of Orthopaedic & Sports Physical Therapy, 44(1), 30-39)   COGNITION: Overall cognitive status: Within functional limits for tasks assessed     SENSATION: WFL for UE   POSTURE: rounded shoulders and forward head  PALPATION: Hypermobile AP/PA bilateral R>L , grossly TTP R shoulder  UPPER EXTREMITY ROM:   Active ROM Right 10/25/2023 Left 10/25/2023  Shoulder flexion WFL * with compensation   Shoulder extension    Shoulder abduction    Shoulder adduction    Shoulder internal rotation functional  T8* T5  Shoulder external rotation T4* T4  Elbow flexion    Elbow extension    Wrist flexion    Wrist extension    Wrist ulnar deviation    Wrist radial deviation    Wrist pronation    Wrist supination    (Blank rows = not tested) *= pain/symptoms  UPPER EXTREMITY MMT:  MMT Right 10/25/2023 Left 10/25/2023  Shoulder flexion 3+* 4-  Shoulder extension    Shoulder abduction    Shoulder adduction 3+* 4-  Shoulder internal rotation 4+* 5  Shoulder external rotation 3* 4+  Middle trapezius    Lower trapezius    Elbow flexion 4+ 5  Elbow extension 4* 5  Wrist flexion    Wrist extension    Wrist ulnar deviation    Wrist radial deviation    Wrist pronation    Wrist supination    Grip strength (lbs)    (Blank rows = not tested) *= pain/symptoms   LOWER EXTREMITY ROM:   Test in upcoming session  Active  Right eval Left eval  Hip flexion    Hip extension    Hip abduction    Hip adduction    Hip internal rotation    Hip external rotation    Knee flexion    Knee extension    Ankle dorsiflexion    Ankle plantarflexion    Ankle inversion    Ankle eversion     (Blank rows = not tested) * =  pain/symptoms  LOWER EXTREMITY MMT:  Test in upcoming session  MMT Right eval Left eval  Hip flexion    Hip extension    Hip abduction    Hip adduction    Hip internal rotation    Hip external rotation    Knee flexion    Knee extension    Ankle dorsiflexion    Ankle plantarflexion    Ankle inversion    Ankle eversion     (Blank rows = not tested) * = pain/symptoms    GAIT: Distance walked: 100 feet Assistive device utilized: None Level of assistance: Complete Independence Comments:   TODAY'S TREATMENT:                                                                                                                              DATE:  10/25/23  Eval, education, and HEP Supine AAROM flexion well arm assisted Seated scap retraction    PATIENT EDUCATION:  Education details: Patient educated on exam findings, POC, scope of PT, HEP, and relevant anatomy/biomechanics. Person educated: Patient Education method: Explanation, Demonstration, and Handouts Education comprehension: verbalized understanding, returned demonstration, verbal cues required, and tactile cues required  HOME EXERCISE PROGRAM: Access Code: [email or web address] URL: [email or web address] Date: 10/25/2023 Prepared by: Prentice Zaunegger  Exercises - Supine Shoulder Flexion AAROM with Hands Clasped  - 2-3 x daily - 7 x weekly - 2 sets - 10 reps - Seated Scapular Retraction  - 2-3 x daily - 7 x weekly - 2 sets - 10 reps  ASSESSMENT:  CLINICAL IMPRESSION: Patient a 63 y.o. y.o. female who was seen today for physical therapy evaluation and treatment for R shoulder and bilateral feet pain. Patient presents with pain limited deficits in R shoulder and bilateral feet strength, ROM, endurance, activity tolerance, and functional mobility with ADL. Patient is having to modify and restrict ADL as indicated by outcome measure score as well as subjective information and objective measures which is affecting overall  participation. Patient will benefit from skilled physical therapy in order to improve function and reduce impairment.  OBJECTIVE IMPAIRMENTS: Abnormal gait, decreased activity tolerance, decreased balance, decreased endurance, decreased mobility, difficulty walking, decreased ROM, decreased strength, hypomobility, increased muscle spasms, impaired flexibility, impaired UE functional use, improper body mechanics, postural dysfunction, and pain  ACTIVITY LIMITATIONS: carrying, lifting, bending, standing, squatting, stairs, transfers, bed mobility, reach over head, locomotion level, and caring for others  PARTICIPATION LIMITATIONS:  meal prep, cleaning, laundry, shopping, community activity, occupation, and yard work  PERSONAL FACTORS: Time since onset of injury/illness/exacerbation and 3+ comorbidities: Hx HTN, fibromyalgia, OA of multiple joints, CAD are also affecting patient's functional outcome.   REHAB POTENTIAL: Fair    CLINICAL DECISION MAKING: Evolving/moderate complexity  EVALUATION COMPLEXITY: Moderate   GOALS: Goals reviewed with patient? Yes  SHORT TERM GOALS: Target date: 11/22/2023    Patient will be independent with HEP in order to improve functional outcomes. Baseline:  Goal status: INITIAL  2.  Patient will report at least 25% improvement in symptoms for improved quality of life. Baseline:  Goal status: INITIAL    LONG TERM GOALS: Target date: 12/20/2023    Patient will report at least 75% improvement in symptoms for improved quality of life. Baseline:  Goal status: INITIAL  2.  Patient will improve quick dash score by at least 20% in order to indicate improved tolerance to activity. Baseline:  Goal status: INITIAL  3.  Patient will demonstrate R shoulder AROM with pain less than 2/10 for improved mobility/lifting. Baseline:  Goal status: INITIAL  4.   Patient will demonstrate grade of 4+/5 MMT grade in all UE tested musculature as evidence of improved  strength to assist with lifting at home.  Baseline:  Goal status: INITIAL  5.  Patient will demonstrate grade of 4+/5 MMT grade in all LE tested musculature as evidence of improved strength to assist with stair ambulation and gait.   Baseline:  Goal status: INITIAL     PLAN:  PT FREQUENCY: 2x/week  PT DURATION: 8 weeks  PLANNED INTERVENTIONS: 97164- PT Re-evaluation, 97110-Therapeutic exercises, 97530- Therapeutic activity, 97112- Neuromuscular re-education, 97535- Self Care, 02859- Manual therapy, [phone]- Gait training, [phone]- Orthotic Fit/training, [phone]- Canalith repositioning, [id number]- Aquatic Therapy, [phone]- Splinting, [phone]- Wound care (first 20 sq cm), 97598- Wound care (each additional 20 sq cm)Patient/Family education, Balance training, Stair training, Taping, Dry Needling, Joint mobilization, Joint manipulation, Spinal manipulation, Spinal mobilization, Scar mobilization, and DME instructions.  PLAN FOR NEXT SESSION: R shoulder and periscap strength, assess bilateral feet pain and bilateral LE when able, trial aquatics when able    [employer], PT 11/03/2023, 11:11 AM

## 2023-11-05 ENCOUNTER — Other Ambulatory Visit (HOSPITAL_COMMUNITY)
Admission: RE | Admit: 2023-11-05 | Discharge: 2023-11-05 | Disposition: A | Discharge status: Home / Self Care | Source: Ambulatory Visit | Attending: Obstetrics and Gynecology | Admitting: Obstetrics and Gynecology

## 2023-11-05 ENCOUNTER — Ambulatory Visit (INDEPENDENT_AMBULATORY_CARE_PROVIDER_SITE_OTHER): Admitting: Obstetrics and Gynecology

## 2023-11-05 ENCOUNTER — Encounter (HOSPITAL_BASED_OUTPATIENT_CLINIC_OR_DEPARTMENT_OTHER): Admitting: Physical Therapy

## 2023-11-05 VITALS — BP 111/77 | HR 69 | Ht 64.0 in | Wt 158.0 lb

## 2023-11-05 DIAGNOSIS — N951 Menopausal and female climacteric states: Secondary | ICD-10-CM

## 2023-11-05 DIAGNOSIS — Z01419 Encounter for gynecological examination (general) (routine) without abnormal findings: Secondary | ICD-10-CM | POA: Insufficient documentation

## 2023-11-05 DIAGNOSIS — Z8742 Personal history of other diseases of the female genital tract: Secondary | ICD-10-CM

## 2023-11-05 NOTE — Progress Notes (Signed)
 ANNUAL GYNECOLOGY VISIT Chief Complaint  Patient presents with   Gynecologic Exam     Subjective:  Sonya [name] is a 63 y.o. G2P2002 who presents for annual exam  Reports that she is interested in bioidentical hormone therapy. Currently on estradiol  and provera . Initially was started on this due to difficulty sleeping. Denies hot flashes/night sweats.  Gyn History: Patient's last menstrual period was 02/23/2016. Last pap:  Lab Results  Component Value Date   DIAGPAP  08/28/2022    - Negative for Intraepithelial Lesions or Malignancy (NILM)   DIAGPAP - Benign reactive/reparative changes 08/28/2022   HPV NOT DETECTED 09/22/2017   HPVHIGH Negative 08/28/2022  History of abnormal pap: yes - 2019 ASCUS/HPV neg - 2020 NILM/HPV neg - 2023 ASCUS/HPV neg - 2024 NILM/HPV neg  Last mammogram: 2025 Last colonoscopy: 2023         11/19/2022    1:14 PM 11/16/2021    2:33 PM 08/14/2021    2:56 PM 10/27/2020    3:26 PM 10/04/2019    1:59 PM  Depression screen PHQ 2/9  Decreased Interest 0 0 0 0 0  Down, Depressed, Hopeless 0 0 0 0 0  PHQ - 2 Score 0 0 0 0 0         No data to display            OB History     Gravida  2   Para  2   Term  2   Preterm      AB      Living  2      SAB      IAB      Ectopic      Multiple      Live Births  2           Past Medical History:  Diagnosis Date   Arthritis    Fibromyalgia    Hypertension     Past Surgical History:  Procedure Laterality Date   CESAREAN SECTION  1987    Social History   Socioeconomic History   Marital status: Legally Separated    Spouse name: Not on file   Number of children: Not on file   Years of education: Not on file   Highest education level: Not on file  Occupational History   Occupation: hair dresser  Tobacco Use   Smoking status: Never   Smokeless tobacco: Never  Vaping Use   Vaping status: Never Used  Substance and Sexual Activity   Alcohol use: No    Drug use: No   Sexual activity: Not Currently    Birth control/protection: None  Other Topics Concern   Not on file  Social History Narrative   Walks occasionally---  has 2 little dogs   Social Drivers of Corporate investment banker Strain: Not on file  Food Insecurity: Not on file  Transportation Needs: Not on file  Physical Activity: Not on file  Stress: Not on file  Social Connections: Not on file    Family History  Problem Relation Age of Onset   Arthritis Mother    Hyperlipidemia Mother    Hypertension Mother    Diabetes Mother    Cancer - Other Mother        vulvar   Aortic stenosis Mother    Lung cancer Mother    Cancer - Lung Mother    Arthritis Father    Hyperlipidemia Father    Heart failure Father  Hx disease   Hypertension Father    Stroke Father    Prostate cancer Father    Macular degeneration Father    Alzheimer's disease Father    Dementia Father    Alcohol abuse Sister    Subarachnoid hemorrhage Sister    Heart attack Brother    Stomach cancer Maternal Grandmother    Stroke Paternal Grandfather    Lung cancer Paternal Grandfather        smoker   Healthy Son    Healthy Son     Current Outpatient Medications on File Prior to Visit  Medication Sig Dispense Refill   amLODipine  (NORVASC ) 5 MG tablet Take 1 tablet (5 mg total) by mouth daily. 90 tablet 3   aspirin EC 81 MG tablet Take 81 mg by mouth daily. Swallow whole.     atomoxetine  (STRATTERA ) 25 MG capsule Take 1 capsule (25 mg total) by mouth daily. 90 capsule 1   azelastine  (ASTELIN ) 0.1 % nasal spray Place 2 sprays into both nostrils 2 (two) times daily. Use in each nostril as directed 30 mL 3   Biotin 1000 MCG tablet Take 1,000 mcg by mouth daily.     CALCIUM  CITRATE-VITAMIN D3 PO Take 1 capsule by mouth daily.     Cholecalciferol (VITAMIN D3) 25 MCG (1000 UT) CAPS Take 1 capsule by mouth daily.     [employer] OMEGA-3 KRILL OIL 500 MG CAPS Take 1 capsule by mouth daily.     estradiol   (ESTRACE ) 0.5 MG tablet Take 1 tablet (0.5 mg total) by mouth daily. 90 tablet 3   Evolocumab  (REPATHA  SURECLICK) 140 MG/ML SOAJ INJECT 140 MG INTO THE SKIN EVERY 14 (FOURTEEN) DAYS. 6 mL 3   Evolocumab  (REPATHA  SURECLICK) 140 MG/ML SOAJ Inject 140 mg into the skin every 14 (fourteen) days. 1 mL 0   ezetimibe  (ZETIA ) 10 MG tablet Take 10 mg by mouth daily.     gabapentin  (NEURONTIN ) 300 MG capsule Take 1 capsule (300 mg total) by mouth 3 (three) times daily. 30 capsule 0   guanFACINE  (TENEX ) 1 MG tablet TAKE 1 TABLET BY MOUTH EVERYDAY AT BEDTIME 90 tablet 1   hydrochlorothiazide  (HYDRODIURIL ) 25 MG tablet Take 1 tablet (25 mg total) by mouth daily. 90 tablet 1   HYDROcodone -acetaminophen  (NORCO/VICODIN) 5-325 MG tablet Take 1 tablet by mouth every 6 (six) hours as needed for moderate pain (pain score 4-6). 45 tablet 0   lidocaine  (XYLOCAINE ) 2 % solution Use as directed 15 mLs in the mouth or throat every 6 (six) hours as needed for mouth pain. Gargle and spit 100 mL 0   LORazepam  (ATIVAN ) 0.5 MG tablet Take 1 tablet (0.5 mg total) by mouth every 6 (six) hours as needed for anxiety. 90 tablet 1   MAGNESIUM OXIDE PO Take 200 mg by mouth daily.     meclizine  (ANTIVERT ) 25 MG tablet Take 1 tablet (25 mg total) by mouth 3 (three) times daily as needed for dizziness. 30 tablet 0   medroxyPROGESTERone  (PROVERA ) 2.5 MG tablet Take 1 tablet (2.5 mg total) by mouth daily. 90 tablet 3   methocarbamol  (ROBAXIN ) 500 MG tablet TAKE 1 TABLET BY MOUTH FOUR TIMES A DAY 30 tablet 1   Phosphatidylserine 100 MG CAPS Take 1 capsule by mouth daily.     scopolamine  (TRANSDERM-SCOP) 1 MG/3DAYS Place 1 patch (1.5 mg total) onto the skin every 3 (three) days. 10 patch 12   Turmeric Curcumin 500 MG CAPS Take 1 capsule by mouth daily.  Ubiquinol 100 MG CAPS Take 1 mg by mouth daily.     ALPRAZolam  (XANAX ) 0.5 MG tablet Take 1 tablet (0.5 mg total) by mouth 3 (three) times daily as needed. (Patient not taking: Reported on  11/05/2023) 30 tablet 1   No current facility-administered medications on file prior to visit.    Allergies  Allergen Reactions   Atorvastatin  Other (See Comments)    Leg cramps   Livalo  [Pitavastatin ] Other (See Comments)    Flu like symptoms - 2 mg   Praluent  [Alirocumab ]     Joint pain   Pravastatin  Other (See Comments)    40 mg -myalgias   Zetia  [Ezetimibe ] Other (See Comments)    Muscle Aches     Objective:   Vitals:   11/05/23 1436  BP: 111/77  Pulse: 69  Weight: 158 lb (71.7 kg)  Height: 5' 4 (1.626 m)   Physical Examination:   General appearance - well appearing, and in no distress  Mental status - alert, oriented to person, place, and time  Psych:  normal mood and affect  Skin - warm and dry, normal color, no suspicious lesions noted  Breasts - breasts appear normal, no suspicious masses, no skin or nipple changes or  axillary nodes  Abdomen - soft, nontender, nondistended, no masses or organomegaly  Pelvic -  VULVA: normal appearing vulva with no masses, tenderness or lesions   VAGINA: normal appearing vagina with normal color and discharge, no lesions   CERVIX: normal appearing cervix without discharge or lesions, no CMT  Thin prep pap is done with HR HPV cotesting  UTERUS: uterus is felt to be normal size, shape, consistency and nontender   ADNEXA: No adnexal masses or tenderness noted.  Extremities:  No swelling or varicosities noted  Chaperone present for exam  Assessment and Plan:  1. Well woman exam with routine gynecological exam (Primary) Pap today Mammo & colonoscopy up to date  - Cytology - PAP( [facility])  2. History of abnormal cervical Pap smear - Cytology - PAP( [facility])  3. Menopausal state Reviewed menopausal hormone therapy in detail and that indications are for vasomotor symptoms of menopause including hot flashes and night sweats, which she didn't really have and doesn't currently either. We reviewed risks of VTE or breast  cancer. Recommend coming off the hormones if she doesn't have a clear indication for it. Reviewed using the lowest effective dose for the shortest amount of time. Recommend against bioidentical hormone therapy due to compounding that lacks stringent quality controls regarding purity and dosing. Offered second opinion  Future Appointments  Date Time Provider Department Center  11/12/2023 10:15 AM Cheryln Asberry FORBES [facility] [id number]  11/18/2023 11:45 AM Gerianne Raisin, PT [facility] [address]  11/21/2023 10:15 AM Magda Josette FORBES, PT [facility] [address]  11/21/2023  1:00 PM Antonio Cyndee Jamee JONELLE, DO [facility] [address]  11/26/2023  1:45 PM Magda Josette FORBES, PT [facility] [address]  12/03/2023  3:15 PM Magda Josette FORBES, PT [facility] [address]  12/05/2023  2:30 PM Magda Josette FORBES, PT [facility] [address]  02/06/2024  4:20 PM Lonni Slain, MD [facility] [phone] [address]    Rollo ONEIDA Bring, MD, FACOG Obstetrician & Gynecologist, [facility] for [facility], [facility]

## 2023-11-10 ENCOUNTER — Ambulatory Visit: Payer: Self-pay | Admitting: Obstetrics and Gynecology

## 2023-11-10 LAB — CYTOLOGY - PAP
Comment: NEGATIVE
Diagnosis: NEGATIVE
High risk HPV: NEGATIVE

## 2023-11-11 ENCOUNTER — Telehealth: Payer: Self-pay | Admitting: Neurology

## 2023-11-11 NOTE — Telephone Encounter (Signed)
 Copied from CRM #[id number]. Topic: Clinical - Medical Advice >> Nov 11, 2023 12:37 PM Sonya [name] wrote: Reason for CRM: Patient is appealing that she needs aquatic PT because she received a letter from the insurance stating that its not needed and denied. Patient stated that she needs the therapy and that she needs the doctor to send fax over a valid reason PT and evidence why PT is needed Fax# [phone]  Patient would also need a call to verify the status

## 2023-11-12 ENCOUNTER — Encounter (HOSPITAL_BASED_OUTPATIENT_CLINIC_OR_DEPARTMENT_OTHER): Payer: Self-pay

## 2023-11-12 ENCOUNTER — Ambulatory Visit (HOSPITAL_BASED_OUTPATIENT_CLINIC_OR_DEPARTMENT_OTHER): Attending: Family Medicine

## 2023-11-12 DIAGNOSIS — M79671 Pain in right foot: Secondary | ICD-10-CM | POA: Insufficient documentation

## 2023-11-12 DIAGNOSIS — M25511 Pain in right shoulder: Secondary | ICD-10-CM | POA: Diagnosis present

## 2023-11-12 DIAGNOSIS — R2689 Other abnormalities of gait and mobility: Secondary | ICD-10-CM | POA: Insufficient documentation

## 2023-11-12 DIAGNOSIS — R29898 Other symptoms and signs involving the musculoskeletal system: Secondary | ICD-10-CM | POA: Diagnosis present

## 2023-11-12 DIAGNOSIS — M79672 Pain in left foot: Secondary | ICD-10-CM | POA: Diagnosis present

## 2023-11-12 DIAGNOSIS — M6281 Muscle weakness (generalized): Secondary | ICD-10-CM | POA: Diagnosis present

## 2023-11-12 NOTE — Therapy (Signed)
 OUTPATIENT PHYSICAL THERAPY  EVALUATION   Patient Name: Sonya [name] MRN: [id number] DOB:16-Apr-1960, 63 y.o., female Today's Date: 11/12/2023  END OF SESSION:  PT End of Session - 11/12/23 1009     Visit Number 2    Number of Visits 16    Date for Recertification  12/20/23    Authorization Type [employer] NEXT    Authorization Time Period 9/20-11/15    Authorization - Visit Number 2    Authorization - Number of Visits 16    PT Start Time 1015    PT Stop Time 1100    PT Time Calculation (min) 45 min    Activity Tolerance Patient tolerated treatment well    Behavior During Therapy [facility] for tasks assessed/performed           Past Medical History:  Diagnosis Date   Arthritis    Fibromyalgia    Hypertension    Past Surgical History:  Procedure Laterality Date   CESAREAN SECTION  1987   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   Family history of heart disease 12/26/2017   Statin intolerance 12/26/2017   Preventative health care 09/28/2017   Arthritis 09/28/2017   Essential hypertension 05/14/2016   Generalized anxiety disorder 05/14/2016    PCP: Antonio Cyndee Jamee JONELLE, DO  REFERRING PROVIDER: Antonio Cyndee Jamee JONELLE, DO  REFERRING DIAG: M15.0 (ICD-10-CM) - Primary osteoarthritis involving multiple joints  Rationale for Evaluation and Treatment: Rehabilitation  THERAPY DIAG:  Muscle weakness (generalized)  Other abnormalities of gait and mobility  Other symptoms and signs involving the musculoskeletal system  Right  shoulder pain, unspecified chronicity  Bilateral foot pain  ONSET DATE: Chronic  SUBJECTIVE:                                                                                                                                                                                           SUBJECTIVE STATEMENT:  Pt reports some popping in her R shoulder. Has pain in feet- R worse than L which occurs when first putting weight on it after sitting. Affects her balance and causes her to almost fall. Describes stiffness in mid foot and sharp pain across tops of feet. I have horrible bunions. Has pain  in tops of feet at rest.   Eval: Patient states R arm pain. She states both feet are painful. Pain with laying on shoulder and difficulty sleeping because of it. Patient states R shoulder acute on chronic pain which was irritated in April. Pain with lifting arm.   PERTINENT HISTORY:  Hx HTN, fibromyalgia, OA of multiple joints, CAD  PAIN:  Are you having pain? Yes: NPRS scale: 1/10 at rest, 7/10 with movement Pain location: R shoulder Pain description: stabbing/aching Aggravating factors: movement Relieving factors: none  PRECAUTIONS: None  WEIGHT BEARING RESTRICTIONS: No  FALLS:  Has patient fallen in last 6 months? No  OCCUPATION: hair dresser  PLOF: Independent  PATIENT GOALS: be able to use arm, decrease pain; be able to walk on feet   OBJECTIVE: (objective measures from initial evaluation unless otherwise dated)  PATIENT SURVEYS:  Quick Dash:  QUICK DASH  Please rate your ability do the following activities in the last week by selecting the number below the appropriate response.   Activities Rating  Open a tight or new jar.  5 = Unable  Do heavy household chores (e.g., wash walls, floors). 4 = Severe difficulty  Carry a shopping bag or briefcase 3 = Moderate difficulty  Wash your back. 4 = Severe difficulty  Use a knife to cut food. 3 = Moderate difficulty  Recreational  activities in which you take some force or impact through your arm, shoulder or hand (e.g., golf, hammering, tennis, etc.). 4 = Severe difficulty  During the past week, to what extent has your arm, shoulder or hand problem interfered with your normal social activities with family, friends, neighbors or groups?  1 = Not at all  During the past week, were you limited in your work or other regular daily activities as a result of your arm, shoulder or hand problem? 4 = Very limited  Rate the severity of the following symptoms in the last week: Arm, Shoulder, or hand pain. 5 = Extreme  Rate the severity of the following symptoms in the last week: Tingling (pins and needles) in your arm, shoulder or hand. 1 = none  During the past week, how much difficulty have you had sleeping because of the pain in your arm, shoulder or hand?  5 = So much difficulty that I can't sleep   (A QuickDASH score may not be calculated if there is greater than 1 missing item.)  Quick Dash Disability/Symptom Score:63.6%  Minimally Clinically Important Difference (MCID): 15-20 points  (Franchignoni, F. et al. (2013). Minimally clinically important difference of the disabilities of the arm, shoulder, and hand outcome measures (DASH) and its shortened version (Quick DASH). Journal of Orthopaedic & Sports Physical Therapy, 44(1), 30-39)   COGNITION: Overall cognitive status: Within functional limits for tasks assessed     SENSATION: WFL for UE   POSTURE: rounded shoulders and forward head  PALPATION: Hypermobile AP/PA bilateral R>L , grossly TTP R shoulder  UPPER EXTREMITY ROM:   Active ROM Right 10/25/2023 Left 10/25/2023  Shoulder flexion WFL * with compensation   Shoulder extension    Shoulder abduction    Shoulder adduction    Shoulder internal rotation functional  T8* T5  Shoulder external rotation T4* T4  Elbow flexion    Elbow extension    Wrist flexion    Wrist extension    Wrist ulnar deviation     Wrist radial deviation    Wrist pronation    Wrist supination    (Blank rows =  not tested) *= pain/symptoms  UPPER EXTREMITY MMT:  MMT Right 10/25/2023 Left 10/25/2023  Shoulder flexion 3+* 4-  Shoulder extension    Shoulder abduction    Shoulder adduction 3+* 4-  Shoulder internal rotation 4+* 5  Shoulder external rotation 3* 4+  Middle trapezius    Lower trapezius    Elbow flexion 4+ 5  Elbow extension 4* 5  Wrist flexion    Wrist extension    Wrist ulnar deviation    Wrist radial deviation    Wrist pronation    Wrist supination    Grip strength (lbs)    (Blank rows = not tested) *= pain/symptoms   LOWER EXTREMITY ROM:   Test in upcoming session  Active  Right 11/12/23 Left 11/12/23  Hip flexion    Hip extension    Hip abduction    Hip adduction    Hip internal rotation    Hip external rotation    Knee flexion 129 132  Knee extension WNL WNL  Ankle dorsiflexion 1 1  Ankle plantarflexion 61 61  Ankle inversion 18 20  Ankle eversion 8 10   (Blank rows = not tested) * = pain/symptoms  LOWER EXTREMITY MMT:  Test in upcoming session  MMT Right 10/8 Left 10/8  Hip flexion    Hip extension    Hip abduction    Hip adduction    Hip internal rotation    Hip external rotation    Knee flexion 4+/5 4+/5  Knee extension 4/5 3+/5!  Ankle dorsiflexion 4+/5! 5/5!  Ankle plantarflexion 5/5! 4+/5!  Ankle inversion 5/5! 4+/5!  Ankle eversion 5/5! 4/5!   (Blank rows = not tested) * = pain/symptoms    GAIT: Distance walked: 100 feet Assistive device utilized: None Level of assistance: Complete Independence Comments:   TODAY'S TREATMENT:                                                                                                                              DATE:   11/12/23 LE ROM and strength Seated towel scrunch x20 Seated HR/TR Seated toe yoga   10/25/23  Eval, education, and HEP Supine AAROM flexion well arm assisted Seated scap retraction     PATIENT EDUCATION:  Education details: Patient educated on exam findings, POC, scope of PT, HEP, and relevant anatomy/biomechanics. Person educated: Patient Education method: Explanation, Demonstration, and Handouts Education comprehension: verbalized understanding, returned demonstration, verbal cues required, and tactile cues required  HOME EXERCISE PROGRAM: Access Code: [email or web address] URL: [email or web address] Date: 10/25/2023 Prepared by: Prentice Zaunegger  Exercises - Supine Shoulder Flexion AAROM with Hands Clasped  - 2-3 x daily - 7 x weekly - 2 sets - 10 reps - Seated Scapular Retraction  - 2-3 x daily - 7 x weekly - 2 sets - 10 reps  ASSESSMENT:  CLINICAL IMPRESSION: Can walk 15-30 minutes before having pain in feet. Pt reports she experiences numbness in first 2 toes in each foot, especially  when in NWB positions. Took measurements for bil LE strength and ROM today. Pt generally strong in ankle musculature though had pain in all movements. Weak in bil knee extension and limited by pain. Initiated some gentle seated ankle ROM exercises we can build on at future session as tolerated. Trialed s/l abduction for R shoulder today without pain present. She does demonstrate poor NMC with this, however. Will monitor pt response to HEP and progress as tolerated.   Eval: Patient a 63 y.o. y.o. female who was seen today for physical therapy evaluation and treatment for R shoulder and bilateral feet pain. Patient presents with pain limited deficits in R shoulder and bilateral feet strength, ROM, endurance, activity tolerance, and functional mobility with ADL. Patient is having to modify and restrict ADL as indicated by outcome measure score as well as subjective information and objective measures which is affecting overall participation. Patient will benefit from skilled physical therapy in order to improve function and reduce impairment.  OBJECTIVE IMPAIRMENTS: Abnormal gait,  decreased activity tolerance, decreased balance, decreased endurance, decreased mobility, difficulty walking, decreased ROM, decreased strength, hypomobility, increased muscle spasms, impaired flexibility, impaired UE functional use, improper body mechanics, postural dysfunction, and pain  ACTIVITY LIMITATIONS: carrying, lifting, bending, standing, squatting, stairs, transfers, bed mobility, reach over head, locomotion level, and caring for others  PARTICIPATION LIMITATIONS:  meal prep, cleaning, laundry, shopping, community activity, occupation, and yard work  PERSONAL FACTORS: Time since onset of injury/illness/exacerbation and 3+ comorbidities: Hx HTN, fibromyalgia, OA of multiple joints, CAD are also affecting patient's functional outcome.   REHAB POTENTIAL: Fair    CLINICAL DECISION MAKING: Evolving/moderate complexity  EVALUATION COMPLEXITY: Moderate   GOALS: Goals reviewed with patient? Yes  SHORT TERM GOALS: Target date: 11/22/2023    Patient will be independent with HEP in order to improve functional outcomes. Baseline:  Goal status: INITIAL  2.  Patient will report at least 25% improvement in symptoms for improved quality of life. Baseline:  Goal status: INITIAL    LONG TERM GOALS: Target date: 12/20/2023    Patient will report at least 75% improvement in symptoms for improved quality of life. Baseline:  Goal status: INITIAL  2.  Patient will improve quick dash score by at least 20% in order to indicate improved tolerance to activity. Baseline:  Goal status: INITIAL  3.  Patient will demonstrate R shoulder AROM with pain less than 2/10 for improved mobility/lifting. Baseline:  Goal status: INITIAL  4.   Patient will demonstrate grade of 4+/5 MMT grade in all UE tested musculature as evidence of improved strength to assist with lifting at home.  Baseline:  Goal status: INITIAL  5.  Patient will demonstrate grade of 4+/5 MMT grade in all LE tested musculature  as evidence of improved strength to assist with stair ambulation and gait.   Baseline:  Goal status: INITIAL     PLAN:  PT FREQUENCY: 2x/week  PT DURATION: 8 weeks  PLANNED INTERVENTIONS: 97164- PT Re-evaluation, 97110-Therapeutic exercises, 97530- Therapeutic activity, 97112- Neuromuscular re-education, 97535- Self Care, [id number]- Manual therapy, [phone]- Gait training, [phone]- Orthotic Fit/training, [phone]- Canalith repositioning, [id number]- Aquatic Therapy, [phone]- Splinting, [phone]- Wound care (first 20 sq cm), 97598- Wound care (each additional 20 sq cm)Patient/Family education, Balance training, Stair training, Taping, Dry Needling, Joint mobilization, Joint manipulation, Spinal manipulation, Spinal mobilization, Scar mobilization, and DME instructions.  PLAN FOR NEXT SESSION: R shoulder and periscap strength, assess bilateral feet pain and bilateral LE when able, trial aquatics when able  Asberry BRAVO Jaevon Paras, PTA 11/12/2023, 12:04 PM

## 2023-11-18 ENCOUNTER — Ambulatory Visit (HOSPITAL_BASED_OUTPATIENT_CLINIC_OR_DEPARTMENT_OTHER): Admitting: Physical Therapy

## 2023-11-18 ENCOUNTER — Encounter (HOSPITAL_BASED_OUTPATIENT_CLINIC_OR_DEPARTMENT_OTHER): Payer: Self-pay | Admitting: Physical Therapy

## 2023-11-18 DIAGNOSIS — R29898 Other symptoms and signs involving the musculoskeletal system: Secondary | ICD-10-CM

## 2023-11-18 DIAGNOSIS — M6281 Muscle weakness (generalized): Secondary | ICD-10-CM | POA: Diagnosis not present

## 2023-11-18 DIAGNOSIS — R2689 Other abnormalities of gait and mobility: Secondary | ICD-10-CM

## 2023-11-18 NOTE — Therapy (Signed)
 OUTPATIENT PHYSICAL THERAPY  EVALUATION   Patient Name: Sonya [name] MRN: [id number] DOB:1960-08-27, 63 y.o., female Today's Date: 11/18/2023  END OF SESSION:  PT End of Session - 11/18/23 1146     Visit Number 3    Number of Visits 16    Date for Recertification  12/20/23    Authorization Type [employer] NEXT    Authorization Time Period 9/20-11/15    Authorization - Number of Visits 16    PT Start Time 1146    PT Stop Time 1225    PT Time Calculation (min) 39 min    Activity Tolerance Patient tolerated treatment well    Behavior During Therapy [facility] for tasks assessed/performed           Past Medical History:  Diagnosis Date   Arthritis    Fibromyalgia    Hypertension    Past Surgical History:  Procedure Laterality Date   CESAREAN SECTION  1987   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   Family history of heart disease 12/26/2017   Statin intolerance 12/26/2017   Preventative health care 09/28/2017   Arthritis 09/28/2017   Essential hypertension 05/14/2016   Generalized anxiety disorder 05/14/2016    PCP: Antonio Cyndee Jamee JONELLE, DO  REFERRING PROVIDER: Antonio Cyndee Jamee JONELLE, DO  REFERRING DIAG: M15.0 (ICD-10-CM) - Primary osteoarthritis involving multiple joints  Rationale for Evaluation and Treatment: Rehabilitation  THERAPY DIAG:  Muscle weakness (generalized)  Other abnormalities of gait and mobility  Other symptoms and signs involving the musculoskeletal system  ONSET DATE: Chronic  SUBJECTIVE:                                                                                                                                                                                            SUBJECTIVE STATEMENT:  My feet just stop me from pain when I walk too far.   Eval: Patient states R arm pain. She states both feet are painful. Pain with laying on shoulder and difficulty sleeping because of it. Patient states R shoulder acute on chronic pain which was irritated in April. Pain with lifting arm.   PERTINENT HISTORY:  Hx HTN, fibromyalgia, OA of multiple joints, CAD  PAIN:  Are  you having pain? Yes: NPRS scale: 1/10 at rest, 7/10 with movement Pain location: R shoulder Pain description: stabbing/aching Aggravating factors: movement Relieving factors: none  PRECAUTIONS: None  WEIGHT BEARING RESTRICTIONS: No  FALLS:  Has patient fallen in last 6 months? No  OCCUPATION: hair dresser  PLOF: Independent  PATIENT GOALS: be able to use arm, decrease pain; be able to walk on feet   OBJECTIVE: (objective measures from initial evaluation unless otherwise dated)  PATIENT SURVEYS:  Quick Dash:  QUICK DASH  Please rate your ability do the following activities in the last week by selecting the number below the appropriate response.   Activities Rating  Open a tight or new jar.  5 = Unable  Do heavy household chores (e.g., wash walls, floors). 4 = Severe difficulty  Carry a shopping bag or briefcase 3 = Moderate difficulty  Wash your back. 4 = Severe difficulty  Use a knife to cut food. 3 = Moderate difficulty  Recreational activities in which you take some force or impact through your arm, shoulder or hand (e.g., golf, hammering, tennis, etc.). 4 = Severe difficulty  During the past week, to what extent has your arm, shoulder or hand problem interfered with your normal social activities with family, friends, neighbors or groups?  1 = Not at all  During the past week, were you limited in your work  or other regular daily activities as a result of your arm, shoulder or hand problem? 4 = Very limited  Rate the severity of the following symptoms in the last week: Arm, Shoulder, or hand pain. 5 = Extreme  Rate the severity of the following symptoms in the last week: Tingling (pins and needles) in your arm, shoulder or hand. 1 = none  During the past week, how much difficulty have you had sleeping because of the pain in your arm, shoulder or hand?  5 = So much difficulty that I can't sleep   (A QuickDASH score may not be calculated if there is greater than 1 missing item.)  Quick Dash Disability/Symptom Score:63.6%  Minimally Clinically Important Difference (MCID): 15-20 points  (Franchignoni, F. et al. (2013). Minimally clinically important difference of the disabilities of the arm, shoulder, and hand outcome measures (DASH) and its shortened version (Quick DASH). Journal of Orthopaedic & Sports Physical Therapy, 44(1), 30-39)   COGNITION: Overall cognitive status: Within functional limits for tasks assessed     SENSATION: WFL for UE   POSTURE: rounded shoulders and forward head  PALPATION: Hypermobile AP/PA bilateral R>L , grossly TTP R shoulder  UPPER EXTREMITY ROM:   Active ROM Right 10/25/2023 Left 10/25/2023  Shoulder flexion WFL * with compensation   Shoulder extension    Shoulder abduction    Shoulder adduction    Shoulder internal rotation functional  T8* T5  Shoulder external rotation T4* T4  Elbow flexion    Elbow extension    Wrist flexion    Wrist extension    Wrist ulnar deviation    Wrist radial deviation    Wrist pronation    Wrist supination    (Blank rows = not tested) *= pain/symptoms  UPPER EXTREMITY MMT:  MMT Right 10/25/2023 Left 10/25/2023  Shoulder flexion 3+* 4-  Shoulder extension    Shoulder abduction    Shoulder adduction 3+* 4-  Shoulder internal rotation 4+* 5  Shoulder external rotation 3* 4+  Middle trapezius    Lower trapezius     Elbow flexion 4+ 5  Elbow extension 4*  5  Wrist flexion    Wrist extension    Wrist ulnar deviation    Wrist radial deviation    Wrist pronation    Wrist supination    Grip strength (lbs)    (Blank rows = not tested) *= pain/symptoms   LOWER EXTREMITY ROM:   Test in upcoming session  Active  Right 11/12/23 Left 11/12/23  Hip flexion    Hip extension    Hip abduction    Hip adduction    Hip internal rotation    Hip external rotation    Knee flexion 129 132  Knee extension WNL WNL  Ankle dorsiflexion 1 1  Ankle plantarflexion 61 61  Ankle inversion 18 20  Ankle eversion 8 10   (Blank rows = not tested) * = pain/symptoms  LOWER EXTREMITY MMT:  Test in upcoming session  MMT Right 10/8 Left 10/8  Hip flexion    Hip extension    Hip abduction 4- 4-  Hip adduction    Hip internal rotation    Hip external rotation    Knee flexion 4+/5 4+/5  Knee extension 4/5 3+/5!  Ankle dorsiflexion 4+/5! 5/5!  Ankle plantarflexion 5/5! 4+/5!  Ankle inversion 5/5! 4+/5!  Ankle eversion 5/5! 4/5!   (Blank rows = not tested) * = pain/symptoms    GAIT: Distance walked: 100 feet Assistive device utilized: None Level of assistance: Complete Independence Comments:   TODAY'S TREATMENT:                                                                                                                              DATE:   10/14 Transv abdominis + iso adduction Mini bridge Bridge + march Supine single leg extension <> 90 Standing hip hinge, ball bw knees- needed cues for pressing back through Lt hip Single leg hip hike ball bw knees Hip hike on 2 step Lateral stepping with red tband at knees  11/12/23 LE ROM and strength Seated towel scrunch x20 Seated HR/TR Seated toe yoga   10/25/23  Eval, education, and HEP Supine AAROM flexion well arm assisted Seated scap retraction    PATIENT EDUCATION:  Education details: Patient educated on exam findings, POC, scope of PT, HEP,  and relevant anatomy/biomechanics. Person educated: Patient Education method: Explanation, Demonstration, and Handouts Education comprehension: verbalized understanding, returned demonstration, verbal cues required, and tactile cues required  HOME EXERCISE PROGRAM: Access Code: [email or web address] URL: [email or web address] Date: 10/25/2023 Prepared by: Prentice Zaunegger  Exercises - Supine Shoulder Flexion AAROM with Hands Clasped  - 2-3 x daily - 7 x weekly - 2 sets - 10 reps - Seated Scapular Retraction  - 2-3 x daily - 7 x weekly - 2 sets - 10 reps  ASSESSMENT:  CLINICAL IMPRESSION: Focused on lumbopelvic strength for central stability today. Pt is very weak in core and has limited control of LE in [place]. Notable tightness in posterior left hip with poor hinge technique because of that.  Eval: Patient a 63 y.o. y.o. female who was seen today for physical therapy evaluation and treatment for R shoulder and bilateral feet pain. Patient presents with pain limited deficits in R shoulder and bilateral feet strength, ROM, endurance, activity tolerance, and functional mobility with ADL. Patient is having to modify and restrict ADL as indicated by outcome measure score as well as subjective information and objective measures which is affecting overall participation. Patient will benefit from skilled physical therapy in order to improve function and reduce impairment.  OBJECTIVE IMPAIRMENTS: Abnormal gait, decreased activity tolerance, decreased balance, decreased endurance, decreased mobility, difficulty walking, decreased ROM, decreased strength, hypomobility, increased muscle spasms, impaired flexibility, impaired UE functional use, improper body mechanics, postural dysfunction, and pain  ACTIVITY LIMITATIONS: carrying, lifting, bending, standing, squatting, stairs, transfers, bed mobility, reach over head, locomotion level, and caring for others  PARTICIPATION LIMITATIONS:  meal prep,  cleaning, laundry, shopping, community activity, occupation, and yard work  PERSONAL FACTORS: Time since onset of injury/illness/exacerbation and 3+ comorbidities: Hx HTN, fibromyalgia, OA of multiple joints, CAD are also affecting patient's functional outcome.   REHAB POTENTIAL: Fair    CLINICAL DECISION MAKING: Evolving/moderate complexity  EVALUATION COMPLEXITY: Moderate   GOALS: Goals reviewed with patient? Yes  SHORT TERM GOALS: Target date: 11/22/2023    Patient will be independent with HEP in order to improve functional outcomes. Baseline:  Goal status: INITIAL  2.  Patient will report at least 25% improvement in symptoms for improved quality of life. Baseline:  Goal status: INITIAL    LONG TERM GOALS: Target date: 12/20/2023    Patient will report at least 75% improvement in symptoms for improved quality of life. Baseline:  Goal status: INITIAL  2.  Patient will improve quick dash score by at least 20% in order to indicate improved tolerance to activity. Baseline:  Goal status: INITIAL  3.  Patient will demonstrate R shoulder AROM with pain less than 2/10 for improved mobility/lifting. Baseline:  Goal status: INITIAL  4.   Patient will demonstrate grade of 4+/5 MMT grade in all UE tested musculature as evidence of improved strength to assist with lifting at home.  Baseline:  Goal status: INITIAL  5.  Patient will demonstrate grade of 4+/5 MMT grade in all LE tested musculature as evidence of improved strength to assist with stair ambulation and gait.   Baseline:  Goal status: INITIAL     PLAN:  PT FREQUENCY: 2x/week  PT DURATION: 8 weeks  PLANNED INTERVENTIONS: 97164- PT Re-evaluation, 97110-Therapeutic exercises, 97530- Therapeutic activity, 97112- Neuromuscular re-education, 97535- Self Care, [id number]- Manual therapy, [phone]- Gait training, [phone]- Orthotic Fit/training, [phone]- Canalith repositioning, [id number]- Aquatic Therapy, [phone]- Splinting, [phone]-  Wound care (first 20 sq cm), 97598- Wound care (each additional 20 sq cm)Patient/Family education, Balance training, Stair training, Taping, Dry Needling, Joint mobilization, Joint manipulation, Spinal manipulation, Spinal mobilization, Scar mobilization, and DME instructions.  PLAN FOR NEXT SESSION: R shoulder and periscap strength, assess bilateral feet pain and bilateral LE when able, trial aquatics when able ; core strength, proprioception in balance from core to feet   Harlene Cordon, PT, DPT 11/18/2023, 12:48 PM

## 2023-11-20 ENCOUNTER — Other Ambulatory Visit: Payer: Self-pay | Admitting: Family Medicine

## 2023-11-20 DIAGNOSIS — F418 Other specified anxiety disorders: Secondary | ICD-10-CM

## 2023-11-20 DIAGNOSIS — G4700 Insomnia, unspecified: Secondary | ICD-10-CM

## 2023-11-20 DIAGNOSIS — M150 Primary generalized (osteo)arthritis: Secondary | ICD-10-CM

## 2023-11-20 MED ORDER — HYDROCODONE-ACETAMINOPHEN 5-325 MG PO TABS: 1.0000 | ORAL_TABLET | Freq: Four times a day (QID) | ORAL | 0 refills | Status: DC | PRN

## 2023-11-20 MED ORDER — LORAZEPAM 0.5 MG PO TABS: 0.5000 mg | ORAL_TABLET | Freq: Four times a day (QID) | ORAL | 1 refills | Status: AC | PRN

## 2023-11-20 NOTE — Telephone Encounter (Unsigned)
 Copied from CRM #[id number]. Topic: Clinical - Medication Refill >> Nov 20, 2023  9:23 AM Berneda FALCON wrote: Medication: HYDROcodone -acetaminophen  (NORCO/VICODIN) 5-325 MG tablet   LORazepam  (ATIVAN ) 0.5 MG tablet    Has the patient contacted their pharmacy? No, controlled substance (Agent: If no, request that the patient contact the pharmacy for the refill. If patient does not wish to contact the pharmacy document the reason why and proceed with request.) (Agent: If yes, when and what did the pharmacy advise?)  This is the patient's preferred pharmacy:  [facility] [phone] [address], [place] - [address] [place] [address] Phone: [phone] Fax: [phone]  Is this the correct pharmacy for this prescription? Yes If no, delete pharmacy and type the correct one.   Has the prescription been filled recently? No  Is the patient out of the medication? Yes  Has the patient been seen for an appointment in the last year OR does the patient have an upcoming appointment? Yes  Can we respond through MyChart? Yes  Agent: Please be advised that Rx refills may take up to 3 business days. We ask that you follow-up with your pharmacy.

## 2023-11-20 NOTE — Telephone Encounter (Signed)
 Requesting: hydrocodone  and lorazepam   Contract:11/19/22 UDS:11/19/22 Last Visit: 08/28/23 Next Visit: 11/21/23  Last Refill:  see med list   Please Advise

## 2023-11-21 ENCOUNTER — Encounter: Payer: Self-pay | Admitting: Family Medicine

## 2023-11-21 ENCOUNTER — Encounter (HOSPITAL_BASED_OUTPATIENT_CLINIC_OR_DEPARTMENT_OTHER): Payer: Self-pay

## 2023-11-21 ENCOUNTER — Ambulatory Visit (HOSPITAL_BASED_OUTPATIENT_CLINIC_OR_DEPARTMENT_OTHER): Admitting: Physical Therapy

## 2023-11-21 ENCOUNTER — Ambulatory Visit: Payer: BC Managed Care – PPO | Admitting: Family Medicine

## 2023-11-21 ENCOUNTER — Telehealth (HOSPITAL_BASED_OUTPATIENT_CLINIC_OR_DEPARTMENT_OTHER): Payer: Self-pay | Admitting: Physical Therapy

## 2023-11-21 VITALS — BP 100/70 | HR 72 | Temp 98.2°F | Resp 16 | Ht 64.0 in | Wt 162.0 lb

## 2023-11-21 DIAGNOSIS — Z0000 Encounter for general adult medical examination without abnormal findings: Secondary | ICD-10-CM | POA: Diagnosis not present

## 2023-11-21 DIAGNOSIS — Z13220 Encounter for screening for lipoid disorders: Secondary | ICD-10-CM | POA: Diagnosis not present

## 2023-11-21 DIAGNOSIS — G4700 Insomnia, unspecified: Secondary | ICD-10-CM | POA: Diagnosis not present

## 2023-11-21 DIAGNOSIS — M150 Primary generalized (osteo)arthritis: Secondary | ICD-10-CM

## 2023-11-21 DIAGNOSIS — Z23 Encounter for immunization: Secondary | ICD-10-CM

## 2023-11-21 LAB — COMPREHENSIVE METABOLIC PANEL WITH GFR
ALT: 14 U/L (ref 0–35)
AST: 18 U/L (ref 0–37)
Albumin: 4.1 g/dL (ref 3.5–5.2)
Alkaline Phosphatase: 54 U/L (ref 39–117)
BUN: 16 mg/dL (ref 6–23)
CO2: 31 meq/L (ref 19–32)
Calcium: 9 mg/dL (ref 8.4–10.5)
Chloride: 102 meq/L (ref 96–112)
Creatinine, Ser: 0.72 mg/dL (ref 0.40–1.20)
GFR: 89.36 mL/min (ref >60.00)
Glucose, Bld: 89 mg/dL (ref 70–99)
Potassium: 3.2 meq/L — ABNORMAL LOW (ref 3.5–5.1)
Sodium: 140 meq/L (ref 135–145)
Total Bilirubin: 0.7 mg/dL (ref 0.2–1.2)
Total Protein: 6.3 g/dL (ref 6.0–8.3)

## 2023-11-21 LAB — CBC WITH DIFFERENTIAL/PLATELET
Basophils Absolute: 0 K/uL (ref 0.0–0.1)
Basophils Relative: 1 % (ref 0.0–3.0)
Eosinophils Absolute: 0.1 K/uL (ref 0.0–0.7)
Eosinophils Relative: 2.5 % (ref 0.0–5.0)
HCT: 39.8 % (ref 36.0–46.0)
Hemoglobin: 13.3 g/dL (ref 12.0–15.0)
Lymphocytes Relative: 23.5 % (ref 12.0–46.0)
Lymphs Abs: 1.1 K/uL (ref 0.7–4.0)
MCHC: 33.4 g/dL (ref 30.0–36.0)
MCV: 88.5 fl (ref 78.0–100.0)
Monocytes Absolute: 0.3 K/uL (ref 0.1–1.0)
Monocytes Relative: 6.4 % (ref 3.0–12.0)
Neutro Abs: 3.1 K/uL (ref 1.4–7.7)
Neutrophils Relative %: 66.6 % (ref 43.0–77.0)
Platelets: 170 K/uL (ref 150.0–400.0)
RBC: 4.5 Mil/uL (ref 3.87–5.11)
RDW: 13.4 % (ref 11.5–15.5)
WBC: 4.6 K/uL (ref 4.0–10.5)

## 2023-11-21 LAB — LIPID PANEL
Cholesterol: 169 mg/dL (ref 0–200)
HDL: 61 mg/dL (ref >39.00)
LDL Cholesterol: 95 mg/dL (ref 0–99)
NonHDL: 108.43
Total CHOL/HDL Ratio: 3
Triglycerides: 67 mg/dL (ref 0.0–149.0)
VLDL: 13.4 mg/dL (ref 0.0–40.0)

## 2023-11-21 LAB — TSH: TSH: 1.57 u[IU]/mL (ref 0.35–5.50)

## 2023-11-21 MED ORDER — HYDROCODONE-ACETAMINOPHEN 5-325 MG PO TABS: 1.0000 | ORAL_TABLET | Freq: Four times a day (QID) | ORAL | 0 refills | Status: AC | PRN

## 2023-11-21 NOTE — Progress Notes (Signed)
 Subjective:    Patient ID: Sonya [name], female    DOB: [birth year], 63 y.o.   MRN: [id number]  Chief Complaint  Patient presents with   Annual Exam    Pt states fasting     HPI Patient is in today for f/u labs and cpe.  Discussed the use of AI scribe software for clinical note transcription with the patient, who gave verbal consent to proceed.  History of Present Illness Sonya [name] is a 63 year old female who presents for a follow-up visit regarding rheumatological issues.  She continues to experience significant pain in her hands, described as 'terrible'. She reports that previous blood work included rheumatoid factor, lupus ANA, and sed rate, and she recalls being told these were normal. She is currently attending physical therapy for her feet and hands and has completed three sessions. She reports that her physical therapist suggested her symptoms might be coming from her legs and hips. Aquatic therapy was recommended, but her insurance denied coverage. She reports she has not received any further recommendations from her physical therapist.  She is taking hormone therapy, specifically estradiol  and minoxidil, prescribed by her previous gynecologist who has since moved to [place] . She is concerned about the synthetic nature of these medications and is inquiring about non-synthetic alternatives.  She has a family history of rheumatological issues, heart disease, high blood pressure, and cancer. Both parents had arthritis, and there is a history of heart disease and high blood pressure in her family.  Socially, she is single and currently going through a prolonged divorce due to her spouse's claims of hospitalization. She does not drink alcohol and lives alone with her dogs.  In terms of cardiovascular health, she has seen a cardiologist and discussed the possibility of a stress test due to a high CT calcium  score of 700-800. She has not yet decided on pursuing further cardiac  testing.  She reports feeling tired, which she attributes to potential sleep apnea, as she wakes up tired and gets up to use the bathroom at night. She is unsure if her insurance will cover a sleep study.    Past Medical History:  Diagnosis Date   Arthritis    Fibromyalgia    Hypertension     Past Surgical History:  Procedure Laterality Date   CESAREAN SECTION  1987    Family History  Problem Relation Age of Onset   Arthritis Mother    Hyperlipidemia Mother    Hypertension Mother    Diabetes Mother    Cancer - Other Mother        vulvar   Aortic stenosis Mother    Lung cancer Mother    Cancer - Lung Mother    Arthritis Father    Hyperlipidemia Father    Heart failure Father        Hx disease   Hypertension Father    Stroke Father    Prostate cancer Father    Macular degeneration Father    Alzheimer's disease Father    Dementia Father    Alcohol abuse Sister    Subarachnoid hemorrhage Sister    Heart attack Brother    Stomach cancer Maternal Grandmother    Stroke Paternal Grandfather    Lung cancer Paternal Grandfather        smoker   Healthy Son    Healthy Son     Social History   Socioeconomic History   Marital status: Legally Separated    Spouse name: Not on  file   Number of children: Not on file   Years of education: Not on file   Highest education level: Not on file  Occupational History   Occupation: hair dresser  Tobacco Use   Smoking status: Never   Smokeless tobacco: Never  Vaping Use   Vaping status: Never Used  Substance and Sexual Activity   Alcohol use: No   Drug use: No   Sexual activity: Not Currently    Partners: Male    Birth control/protection: None  Other Topics Concern   Not on file  Social History Narrative   Walks occasionally---  has 2 little dogs   Social Drivers of Corporate investment banker Strain: Not on file  Food Insecurity: Not on file  Transportation Needs: Not on file  Physical Activity: Not on file   Stress: Not on file  Social Connections: Not on file  Intimate Partner Violence: Not on file    Outpatient Medications Prior to Visit  Medication Sig Dispense Refill   amLODipine  (NORVASC ) 5 MG tablet Take 1 tablet (5 mg total) by mouth daily. 90 tablet 3   aspirin EC 81 MG tablet Take 81 mg by mouth daily. Swallow whole.     atomoxetine  (STRATTERA ) 25 MG capsule Take 1 capsule (25 mg total) by mouth daily. 90 capsule 1   azelastine  (ASTELIN ) 0.1 % nasal spray Place 2 sprays into both nostrils 2 (two) times daily. Use in each nostril as directed 30 mL 3   Biotin 1000 MCG tablet Take 1,000 mcg by mouth daily.     CALCIUM  CITRATE-VITAMIN D3 PO Take 1 capsule by mouth daily.     Cholecalciferol (VITAMIN D3) 25 MCG (1000 UT) CAPS Take 1 capsule by mouth daily.     [employer] OMEGA-3 KRILL OIL 500 MG CAPS Take 1 capsule by mouth daily.     estradiol  (ESTRACE ) 0.5 MG tablet Take 1 tablet (0.5 mg total) by mouth daily. 90 tablet 3   Evolocumab  (REPATHA  SURECLICK) 140 MG/ML SOAJ INJECT 140 MG INTO THE SKIN EVERY 14 (FOURTEEN) DAYS. 6 mL 3   Evolocumab  (REPATHA  SURECLICK) 140 MG/ML SOAJ Inject 140 mg into the skin every 14 (fourteen) days. 1 mL 0   ezetimibe  (ZETIA ) 10 MG tablet Take 10 mg by mouth daily.     gabapentin  (NEURONTIN ) 300 MG capsule Take 1 capsule (300 mg total) by mouth 3 (three) times daily. 30 capsule 0   guanFACINE  (TENEX ) 1 MG tablet TAKE 1 TABLET BY MOUTH EVERYDAY AT BEDTIME 90 tablet 1   hydrochlorothiazide  (HYDRODIURIL ) 25 MG tablet Take 1 tablet (25 mg total) by mouth daily. 90 tablet 1   lidocaine  (XYLOCAINE ) 2 % solution Use as directed 15 mLs in the mouth or throat every 6 (six) hours as needed for mouth pain. Gargle and spit 100 mL 0   LORazepam  (ATIVAN ) 0.5 MG tablet Take 1 tablet (0.5 mg total) by mouth every 6 (six) hours as needed for anxiety. 90 tablet 1   MAGNESIUM OXIDE PO Take 200 mg by mouth daily.     meclizine  (ANTIVERT ) 25 MG tablet Take 1 tablet (25 mg total) by  mouth 3 (three) times daily as needed for dizziness. 30 tablet 0   medroxyPROGESTERone  (PROVERA ) 2.5 MG tablet Take 1 tablet (2.5 mg total) by mouth daily. 90 tablet 3   methocarbamol  (ROBAXIN ) 500 MG tablet TAKE 1 TABLET BY MOUTH FOUR TIMES A DAY 30 tablet 1   Phosphatidylserine 100 MG CAPS Take 1 capsule by mouth  daily.     scopolamine  (TRANSDERM-SCOP) 1 MG/3DAYS Place 1 patch (1.5 mg total) onto the skin every 3 (three) days. 10 patch 12   Turmeric Curcumin 500 MG CAPS Take 1 capsule by mouth daily.     Ubiquinol 100 MG CAPS Take 1 mg by mouth daily.     HYDROcodone -acetaminophen  (NORCO/VICODIN) 5-325 MG tablet Take 1 tablet by mouth every 6 (six) hours as needed for moderate pain (pain score 4-6). 45 tablet 0   ALPRAZolam  (XANAX ) 0.5 MG tablet Take 1 tablet (0.5 mg total) by mouth 3 (three) times daily as needed. (Patient not taking: Reported on 11/21/2023) 30 tablet 1   No facility-administered medications prior to visit.    Allergies  Allergen Reactions   Atorvastatin  Other (See Comments)    Leg cramps   Livalo  [Pitavastatin ] Other (See Comments)    Flu like symptoms - 2 mg   Praluent  [Alirocumab ]     Joint pain   Pravastatin  Other (See Comments)    40 mg -myalgias   Zetia  [Ezetimibe ] Other (See Comments)    Muscle Aches    Review of Systems  Constitutional:  Negative for chills, fever and malaise/fatigue.  HENT:  Negative for congestion and hearing loss.   Eyes:  Negative for blurred vision and discharge.  Respiratory:  Negative for cough, sputum production and shortness of breath.   Cardiovascular:  Negative for chest pain, palpitations and leg swelling.  Gastrointestinal:  Negative for abdominal pain, blood in stool, constipation, diarrhea, heartburn, nausea and vomiting.  Genitourinary:  Negative for dysuria, frequency, hematuria and urgency.  Musculoskeletal:  Positive for joint pain. Negative for back pain, falls and myalgias.  Skin:  Negative for rash.  Neurological:   Negative for dizziness, sensory change, loss of consciousness, weakness and headaches.  Endo/Heme/Allergies:  Negative for environmental allergies. Does not bruise/bleed easily.  Psychiatric/Behavioral:  Negative for depression and suicidal ideas. The patient is not nervous/anxious and does not have insomnia.        Objective:    Physical Exam Vitals and nursing note reviewed.  Constitutional:      General: She is not in acute distress.    Appearance: Normal appearance. She is well-developed.  HENT:     Head: Normocephalic and atraumatic.     Right Ear: Tympanic membrane, ear canal and external ear normal. There is no impacted cerumen.     Left Ear: Tympanic membrane, ear canal and external ear normal. There is no impacted cerumen.     Nose: Nose normal.     Mouth/Throat:     Mouth: Mucous membranes are moist.     Pharynx: Oropharynx is clear. No oropharyngeal exudate or posterior oropharyngeal erythema.  Eyes:     General: No scleral icterus.       Right eye: No discharge.        Left eye: No discharge.     Conjunctiva/sclera: Conjunctivae normal.     Pupils: Pupils are equal, round, and reactive to light.  Neck:     Thyroid : No thyromegaly or thyroid  tenderness.     Vascular: No JVD.  Cardiovascular:     Rate and Rhythm: Normal rate and regular rhythm.     Heart sounds: Normal heart sounds. No murmur heard. Pulmonary:     Effort: Pulmonary effort is normal. No respiratory distress.     Breath sounds: Normal breath sounds.  Abdominal:     General: Bowel sounds are normal. There is no distension.     Palpations: Abdomen  is soft. There is no mass.     Tenderness: There is no abdominal tenderness. There is no guarding or rebound.  Genitourinary:    Vagina: Normal.  Musculoskeletal:        General: Tenderness and deformity present. Normal range of motion.     Cervical back: Normal range of motion and neck supple.     Right lower leg: No edema.     Left lower leg: No edema.   Lymphadenopathy:     Cervical: No cervical adenopathy.  Skin:    General: Skin is warm and dry.     Findings: No erythema or rash.  Neurological:     Mental Status: She is alert and oriented to person, place, and time.     Cranial Nerves: No cranial nerve deficit.     Deep Tendon Reflexes: Reflexes are normal and symmetric.  Psychiatric:        Mood and Affect: Mood normal.        Behavior: Behavior normal.        Thought Content: Thought content normal.        Judgment: Judgment normal.     BP 100/70 (BP Location: Left Arm, Patient Position: Sitting, Cuff Size: Normal)   Pulse 72   Temp 98.2 F (36.8 C) (Oral)   Resp 16   Ht 5' 4 (1.626 m)   Wt 162 lb (73.5 kg)   LMP 02/23/2016   SpO2 98%   BMI 27.81 kg/m  Wt Readings from Last 3 Encounters:  11/21/23 162 lb (73.5 kg)  11/05/23 158 lb (71.7 kg)  10/29/23 164 lb (74.4 kg)    Diabetic Foot Exam - Simple   No data filed    Lab Results  Component Value Date   WBC 4.0 08/28/2023   HGB 14.0 08/28/2023   HCT 41.7 08/28/2023   PLT 189.0 08/28/2023   GLUCOSE 102 (H) 08/28/2023   CHOL 178 11/19/2022   TRIG 71.0 11/19/2022   HDL 66.70 11/19/2022   LDLCALC 97 11/19/2022   ALT 16 08/28/2023   AST 18 08/28/2023   NA 141 08/28/2023   K 3.7 08/28/2023   CL 102 08/28/2023   CREATININE 0.80 08/28/2023   BUN 16 08/28/2023   CO2 30 08/28/2023   TSH 1.81 08/28/2023    Lab Results  Component Value Date   TSH 1.81 08/28/2023   Lab Results  Component Value Date   WBC 4.0 08/28/2023   HGB 14.0 08/28/2023   HCT 41.7 08/28/2023   MCV 89.6 08/28/2023   PLT 189.0 08/28/2023   Lab Results  Component Value Date   NA 141 08/28/2023   K 3.7 08/28/2023   CO2 30 08/28/2023   GLUCOSE 102 (H) 08/28/2023   BUN 16 08/28/2023   CREATININE 0.80 08/28/2023   BILITOT 0.6 08/28/2023   ALKPHOS 56 08/28/2023   AST 18 08/28/2023   ALT 16 08/28/2023   PROT 6.4 08/28/2023   ALBUMIN 4.3 08/28/2023   CALCIUM  9.1 08/28/2023    GFR 78.88 08/28/2023   Lab Results  Component Value Date   CHOL 178 11/19/2022   Lab Results  Component Value Date   HDL 66.70 11/19/2022   Lab Results  Component Value Date   LDLCALC 97 11/19/2022   Lab Results  Component Value Date   TRIG 71.0 11/19/2022   Lab Results  Component Value Date   CHOLHDL 3 11/19/2022   No results found for: HGBA1C     Assessment & Plan:  Preventative  health care -     CBC with Differential/Platelet -     Comprehensive metabolic panel with GFR -     Lipid panel -     TSH  Primary osteoarthritis involving multiple joints -     HYDROcodone -Acetaminophen ; Take 1 tablet by mouth every 6 (six) hours as needed for moderate pain (pain score 4-6).  Dispense: 45 tablet; Refill: 0  Need for influenza vaccination -     Flu vaccine trivalent PF, 6mos and older(Flulaval,Afluria,Fluarix,Fluzone)  Insomnia, unspecified type -     Ambulatory referral to Neurology  .Assessment and Plan Assessment & Plan Primary generalized osteoarthritis   Chronic pain in her hands and feet persists without rheumatological abnormalities. Physical therapy continues, focusing on her feet, hands, legs, and hips. Insurance issues for aquatic therapy remain unresolved. Continue physical therapy and discuss with her therapist about appealing the insurance denial for aquatic therapy.  Hyperlipidemia   Cholesterol levels remain high despite previous interventions. A stress test was discussed due to a high CT calcium  score, and the decision to proceed is left to her discretion. Consider a stress test based on her decision.  Obesity   Challenges with insurance coverage for obesity treatment medications were discussed. Emphasized the importance of addressing sleep apnea as it may contribute to difficulty in losing weight.  Suspected sleep apnea   Symptoms suggest sleep apnea, including waking up tired and nocturia. Refer to neurology for further evaluation and a potential  sleep study.    Nesreen Albano R Lowne Chase, DO

## 2023-11-21 NOTE — Telephone Encounter (Signed)
 No-show for today's PT visit- called and LMOM about next scheduled PT appt.   Josette Rough, PT, DPT 11/21/23 10:35 AM

## 2023-11-26 ENCOUNTER — Encounter (HOSPITAL_BASED_OUTPATIENT_CLINIC_OR_DEPARTMENT_OTHER): Payer: Self-pay | Admitting: Physical Therapy

## 2023-11-26 ENCOUNTER — Ambulatory Visit

## 2023-11-26 ENCOUNTER — Telehealth: Payer: Self-pay

## 2023-11-26 ENCOUNTER — Other Ambulatory Visit: Payer: Self-pay | Admitting: Lab

## 2023-11-26 ENCOUNTER — Encounter: Payer: Self-pay | Admitting: Podiatry

## 2023-11-26 ENCOUNTER — Ambulatory Visit (HOSPITAL_BASED_OUTPATIENT_CLINIC_OR_DEPARTMENT_OTHER): Admitting: Physical Therapy

## 2023-11-26 ENCOUNTER — Other Ambulatory Visit (HOSPITAL_COMMUNITY): Payer: Self-pay

## 2023-11-26 ENCOUNTER — Ambulatory Visit: Admitting: Podiatry

## 2023-11-26 DIAGNOSIS — M6281 Muscle weakness (generalized): Secondary | ICD-10-CM

## 2023-11-26 DIAGNOSIS — G5792 Unspecified mononeuropathy of left lower limb: Secondary | ICD-10-CM | POA: Diagnosis not present

## 2023-11-26 DIAGNOSIS — Z79899 Other long term (current) drug therapy: Secondary | ICD-10-CM

## 2023-11-26 DIAGNOSIS — M25511 Pain in right shoulder: Secondary | ICD-10-CM

## 2023-11-26 DIAGNOSIS — M19072 Primary osteoarthritis, left ankle and foot: Secondary | ICD-10-CM | POA: Diagnosis not present

## 2023-11-26 DIAGNOSIS — G5791 Unspecified mononeuropathy of right lower limb: Secondary | ICD-10-CM

## 2023-11-26 DIAGNOSIS — R2689 Other abnormalities of gait and mobility: Secondary | ICD-10-CM

## 2023-11-26 DIAGNOSIS — M19071 Primary osteoarthritis, right ankle and foot: Secondary | ICD-10-CM

## 2023-11-26 DIAGNOSIS — M79671 Pain in right foot: Secondary | ICD-10-CM

## 2023-11-26 DIAGNOSIS — R29898 Other symptoms and signs involving the musculoskeletal system: Secondary | ICD-10-CM

## 2023-11-26 MED ORDER — PREGABALIN 25 MG PO CAPS: 25.0000 mg | ORAL_CAPSULE | Freq: Two times a day (BID) | ORAL | 2 refills | Status: DC

## 2023-11-26 NOTE — Telephone Encounter (Signed)
 Pharmacy Patient Advocate Encounter   Received notification from Patient Pharmacy that prior authorization for Hydrocodone /APAP 5/325mg  tabs is required/requested.   Insurance verification completed.   The patient is insured through [employer].   Per test claim: PA required; PA started via CoverMyMeds. KEY [id number] . Waiting for clinical questions to populate.

## 2023-11-26 NOTE — Telephone Encounter (Signed)
 Clinical questions answered and PA submitted.

## 2023-11-26 NOTE — Telephone Encounter (Signed)
 Please sent Lyrica to [facility] pharmacy has been updated patient there now.

## 2023-11-26 NOTE — Progress Notes (Signed)
  Subjective:  Patient ID: Sonya [name], female    DOB: [birth year],   MRN: [id number]  Chief Complaint  Patient presents with   Foot Pain    I have pain on top of my feet every few minutes last night.  I put icy hot on it.  The pain level was a 10.  I need some shots on top of these feet.   Nail Problem    I have an ingrown toenail on my left big toe.  On the 3rd toe on my right, seems like the nail is coming off.    63 y.o. female presents for concern of bilateral foot pain that has been going on for years.  Relates periodically she will get shooting pain on the top of both of her feet worse on the right.  She can only wear Altra has no other shoes are comfortable.  Relates most of the pain happens when she is just seated or just laying down.  Occasionally will hurt when she is walking.  Relates numbness in the feet as well as burning and tingling.. Denies any other pedal complaints. Denies n/v/f/c.   Past Medical History:  Diagnosis Date   Arthritis    Fibromyalgia    Hypertension     Objective:  Physical Exam: Vascular: DP/PT pulses 2/4 bilateral. CFT <3 seconds. Normal hair growth on digits. No edema.  Skin. No lacerations or abrasions bilateral feet.  Musculoskeletal: MMT 5/5 bilateral lower extremities in DF, PF, Inversion and Eversion. Deceased ROM in DF of ankle joint.  No tenderness to palpation on the top of the foot bilateral.  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 Neurological: Sensation intact to light touch.   Assessment:   1. Neuritis of right foot   2. Neuritis of left foot   3. Arthritis of both midfeet      Plan:  Patient was evaluated and treated and all questions answered. Discussed neuritis vs neuropathy and etiology as well as treatment with patient.  Reviewed notes from PCP and previous labs for rheumatoid factor negative Radiographs reviewed and discussed with patient.  No acute fractures or dislocations noted mild degenerative  changes noted in the midfoot and first MPJ bilateral moderate HAV deformity noted nonweightbearing x-rays.  Lyrica -Discussed and educated patient on  foot care, especially with  regards to the vascular, neurological and musculoskeletal systems.  -Discussed supportive shoes at all times and checking feet regularly.  - Will try Lyrica as gabapentin  has failed in the past.  Lyrica sent to pharmacy Discussed if this does not work we can consider injection for possible nerve irritation.  Discussed other possible causes of nerve irritation including back issues.  Discussed potential need for NCV in the future -Patient to return in 6 weeks for recheck   Asberry Failing, DPM

## 2023-11-26 NOTE — Therapy (Signed)
 OUTPATIENT PHYSICAL THERAPY  TREATMENT    Patient Name: ABREY BRADWAY MRN: [id number] DOB:26-Apr-1960, 63 y.o., female Today's Date: 11/26/2023  END OF SESSION:  PT End of Session - 11/26/23 1408     Visit Number 4    Number of Visits 16    Date for Recertification  12/20/23    Authorization Type [employer] N[employer]    Authorization Time Period 9/20-11/15    Authorization - Visit Number 4    Authorization - Number of Visits 16    PT Start Time 1347    PT Stop Time 1426    PT Time Calculation (min) 39 min    Activity Tolerance Patient tolerated treatment well    Behavior During Therapy [employer] for tasks assessed/performed            Past Medical History:  Diagnosis Date   Arthritis    Fibromyalgia    Hypertension    Past Surgical History:  Procedure Laterality Date   CESAREAN SECTION  1987   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   Family history of heart disease 12/26/2017   Statin intolerance 12/26/2017   Preventative health care 09/28/2017   Arthritis 09/28/2017   Essential hypertension 05/14/2016   Generalized anxiety disorder 05/14/2016    PCP: Antonio Cyndee Jamee JONELLE, DO  REFERRING PROVIDER: Antonio Cyndee Jamee JONELLE, DO  REFERRING DIAG: M15.0 (ICD-10-CM) - Primary osteoarthritis involving multiple joints  Rationale for Evaluation and Treatment: Rehabilitation  THERAPY DIAG:  Muscle weakness (generalized)  Other abnormalities of gait and mobility  Other symptoms and signs involving the musculoskeletal system  Right  shoulder pain, unspecified chronicity  Bilateral foot pain  ONSET DATE: Chronic  SUBJECTIVE:                                                                                                                                                                                           SUBJECTIVE STATEMENT:  Things are going well, not much is new since last time. Going to get shots (maybe cortisone?) on the top of my feet today.  Eval: Patient states R arm pain. She states both feet are painful. Pain with laying on shoulder and difficulty sleeping because of it. Patient states R  shoulder acute on chronic pain which was irritated in April. Pain with lifting arm.   PERTINENT HISTORY:  Hx HTN, fibromyalgia, OA of multiple joints, CAD  PAIN:  Are you having pain? Yes: NPRS scale: last night 9/10, right now fine 5/10 Pain location: R shoulder and feet  Pain description: stabbing/aching Aggravating factors: movement, walking  Relieving factors: unclear   PRECAUTIONS: None  WEIGHT BEARING RESTRICTIONS: No  FALLS:  Has patient fallen in last 6 months? No  OCCUPATION: hair dresser  PLOF: Independent  PATIENT GOALS: be able to use arm, decrease pain; be able to walk on feet   OBJECTIVE: (objective measures from initial evaluation unless otherwise dated)  PATIENT SURVEYS:  Quick Dash:  QUICK DASH  Please rate your ability do the following activities in the last week by selecting the number below the appropriate response.   Activities Rating  Open a tight or new jar.  5 = Unable  Do heavy household chores (e.g., wash walls, floors). 4 = Severe difficulty  Carry a shopping bag or briefcase 3 = Moderate difficulty  Wash your back. 4 = Severe difficulty  Use a knife to cut food. 3 = Moderate difficulty  Recreational activities in which you take some force or impact through your arm, shoulder or hand (e.g., golf, hammering, tennis, etc.). 4 = Severe difficulty  During the past week,  to what extent has your arm, shoulder or hand problem interfered with your normal social activities with family, friends, neighbors or groups?  1 = Not at all  During the past week, were you limited in your work or other regular daily activities as a result of your arm, shoulder or hand problem? 4 = Very limited  Rate the severity of the following symptoms in the last week: Arm, Shoulder, or hand pain. 5 = Extreme  Rate the severity of the following symptoms in the last week: Tingling (pins and needles) in your arm, shoulder or hand. 1 = none  During the past week, how much difficulty have you had sleeping because of the pain in your arm, shoulder or hand?  5 = So much difficulty that I can't sleep   (A QuickDASH score may not be calculated if there is greater than 1 missing item.)  Quick Dash Disability/Symptom Score:63.6%  Minimally Clinically Important Difference (MCID): 15-20 points  (Franchignoni, F. et al. (2013). Minimally clinically important difference of the disabilities of the arm, shoulder, and hand outcome measures (DASH) and its shortened version (Quick DASH). Journal of Orthopaedic & Sports Physical Therapy, 44(1), 30-39)   COGNITION: Overall cognitive status: Within functional limits for tasks assessed     SENSATION: WFL for UE   POSTURE: rounded shoulders and forward head  PALPATION: Hypermobile AP/PA bilateral R>L , grossly TTP R shoulder  UPPER EXTREMITY ROM:   Active ROM Right 10/25/2023 Left 10/25/2023  Shoulder flexion [facility] * with compensation   Shoulder extension    Shoulder abduction    Shoulder adduction    Shoulder internal rotation functional  T8* T5  Shoulder external rotation T4* T4  Elbow flexion    Elbow extension    Wrist flexion    Wrist extension    Wrist ulnar deviation    Wrist radial deviation    Wrist pronation    Wrist supination    (Blank rows = not tested) *= pain/symptoms  UPPER EXTREMITY MMT:  MMT Right 10/25/2023  Left 10/25/2023 11/26/23 R 11/26/23 L  Shoulder flexion 3+* 4- 3+ 4  Shoulder  extension      Shoulder abduction   3+ 4  Shoulder adduction 3+* 4-    Shoulder internal rotation 4+* 5 5 5   Shoulder external rotation 3* 4+ 3- 4+  Middle trapezius      Lower trapezius      Elbow flexion 4+ 5    Elbow extension 4* 5    Wrist flexion      Wrist extension      Wrist ulnar deviation      Wrist radial deviation      Wrist pronation      Wrist supination      Grip strength (lbs)      (Blank rows = not tested) *= pain/symptoms   LOWER EXTREMITY ROM:   Test in upcoming session  Active  Right 11/12/23 Left 11/12/23  Hip flexion    Hip extension    Hip abduction    Hip adduction    Hip internal rotation    Hip external rotation    Knee flexion 129 132  Knee extension WNL WNL  Ankle dorsiflexion 1 1  Ankle plantarflexion 61 61  Ankle inversion 18 20  Ankle eversion 8 10   (Blank rows = not tested) * = pain/symptoms  LOWER EXTREMITY MMT:  Test in upcoming session  MMT Right 10/8 Left 10/8  Hip flexion    Hip extension    Hip abduction 4- 4-  Hip adduction    Hip internal rotation    Hip external rotation    Knee flexion 4+/5 4+/5  Knee extension 4/5 3+/5!  Ankle dorsiflexion 4+/5! 5/5!  Ankle plantarflexion 5/5! 4+/5!  Ankle inversion 5/5! 4+/5!  Ankle eversion 5/5! 4/5!   (Blank rows = not tested) * = pain/symptoms    GAIT: Distance walked: 100 feet Assistive device utilized: None Level of assistance: Complete Independence Comments:   TODAY'S TREATMENT:                                                                                                                              DATE:   11/26/23  MMT for UEs, goals UBE L2.5 x3 min forward/3 min backwards  Hooklying TA set 12x3 seconds  Hooklying TA set + march x12 Wiggle worms for obliques supine x10 B alternating Bridges + ABD into red TB x12 Sidelying clams red TB x10 B Holding 2# at 90*  + TA set + heels  slides x5 B Scapular retraction red TB 2x12  Attempted shoulder extensions red TB  Shoulder flexion in pillowcase at door x12  2#  Shoulder abd in pillowcase at door x12 2# Door pushups to pt selected depth x10  Wall Ys 2# x10  Wall Is 2# x10  Wall Ws 2# x10     10/14 Transv abdominis + iso adduction Mini bridge Bridge + march Supine single leg extension <> 90 Standing hip hinge, ball bw knees- needed cues for pressing back through Lt hip Single leg  hip hike ball bw knees Hip hike on 2 step Lateral stepping with red tband at knees  11/12/23 LE ROM and strength Seated towel scrunch x20 Seated HR/TR Seated toe yoga   10/25/23  Eval, education, and HEP Supine AAROM flexion well arm assisted Seated scap retraction    PATIENT EDUCATION:  Education details: Patient educated on exam findings, POC, scope of PT, HEP, and relevant anatomy/biomechanics. Person educated: Patient Education method: Explanation, Demonstration, and Handouts Education comprehension: verbalized understanding, returned demonstration, verbal cues required, and tactile cues required  HOME EXERCISE PROGRAM: Access Code: [email or web address] URL: [email or web address] Date: 10/25/2023 Prepared by: Prentice Zaunegger  Exercises - Supine Shoulder Flexion AAROM with Hands Clasped  - 2-3 x daily - 7 x weekly - 2 sets - 10 reps - Seated Scapular Retraction  - 2-3 x daily - 7 x weekly - 2 sets - 10 reps  ASSESSMENT:  CLINICAL IMPRESSION:  Arrives today doing OK, we continued focus on core strength and on shoulder focused interventions. She is getting a shot in her feet this afternoon, hopefully this will help her symptoms. Encouraged regular HEP performance as well as breaking existing program into thirds or halfs if necessary for consistency. Will continue to progress challenges as able.   Eval: Patient a 63 y.o. y.o. female who was seen today for physical therapy evaluation and treatment for R shoulder  and bilateral feet pain. Patient presents with pain limited deficits in R shoulder and bilateral feet strength, ROM, endurance, activity tolerance, and functional mobility with ADL. Patient is having to modify and restrict ADL as indicated by outcome measure score as well as subjective information and objective measures which is affecting overall participation. Patient will benefit from skilled physical therapy in order to improve function and reduce impairment.  OBJECTIVE IMPAIRMENTS: Abnormal gait, decreased activity tolerance, decreased balance, decreased endurance, decreased mobility, difficulty walking, decreased ROM, decreased strength, hypomobility, increased muscle spasms, impaired flexibility, impaired UE functional use, improper body mechanics, postural dysfunction, and pain  ACTIVITY LIMITATIONS: carrying, lifting, bending, standing, squatting, stairs, transfers, bed mobility, reach over head, locomotion level, and caring for others  PARTICIPATION LIMITATIONS:  meal prep, cleaning, laundry, shopping, community activity, occupation, and yard work  PERSONAL FACTORS: Time since onset of injury/illness/exacerbation and 3+ comorbidities: Hx HTN, fibromyalgia, OA of multiple joints, CAD are also affecting patient's functional outcome.   REHAB POTENTIAL: Fair    CLINICAL DECISION MAKING: Evolving/moderate complexity  EVALUATION COMPLEXITY: Moderate   GOALS: Goals reviewed with patient? Yes  SHORT TERM GOALS: Target date: 11/22/2023    Patient will be independent with HEP in order to improve functional outcomes. Baseline:  Goal status: IN PROGRESS 11/26/23  2.  Patient will report at least 25% improvement in symptoms for improved quality of life. Baseline:  Goal status: MET 11/26/23    LONG TERM GOALS: Target date: 12/20/2023    Patient will report at least 75% improvement in symptoms for improved quality of life. Baseline:  Goal status: INITIAL  2.  Patient will improve  quick dash score by at least 20% in order to indicate improved tolerance to activity. Baseline:  Goal status: INITIAL  3.  Patient will demonstrate R shoulder AROM with pain less than 2/10 for improved mobility/lifting. Baseline:  Goal status: INITIAL  4.   Patient will demonstrate grade of 4+/5 MMT grade in all UE tested musculature as evidence of improved strength to assist with lifting at home.  Baseline:  Goal status: INITIAL  5.  Patient will demonstrate grade  of 4+/5 MMT grade in all LE tested musculature as evidence of improved strength to assist with stair ambulation and gait.   Baseline:  Goal status: INITIAL     PLAN:  PT FREQUENCY: 2x/week  PT DURATION: 8 weeks  PLANNED INTERVENTIONS: 97164- PT Re-evaluation, 97110-Therapeutic exercises, 97530- Therapeutic activity, 97112- Neuromuscular re-education, 97535- Self Care, [id number]- Manual therapy, [phone]- Gait training, [phone]- Orthotic Fit/training, [phone]- Canalith repositioning, [id number]- Aquatic Therapy, [phone]- Splinting, [phone]- Wound care (first 20 sq cm), 97598- Wound care (each additional 20 sq cm)Patient/Family education, Balance training, Stair training, Taping, Dry Needling, Joint mobilization, Joint manipulation, Spinal manipulation, Spinal mobilization, Scar mobilization, and DME instructions.  PLAN FOR NEXT SESSION: R shoulder and periscap strength, assess bilateral feet pain and bilateral LE when able, core strength, proprioception in balance from core to feet. Per front, insurance will not cover land and water at the same time    Josette Rough, PT, DPT 11/26/23 2:27 PM

## 2023-11-27 ENCOUNTER — Other Ambulatory Visit: Payer: Self-pay | Admitting: Podiatry

## 2023-11-27 MED ORDER — PREGABALIN 25 MG PO CAPS: 25.0000 mg | ORAL_CAPSULE | Freq: Two times a day (BID) | ORAL | 2 refills | Status: AC

## 2023-11-27 NOTE — Telephone Encounter (Signed)
 Pharmacy Patient Advocate Encounter  Received notification from [employer] that Prior Authorization for Hydrocodone /APAP 5/325mg  tabs has been DENIED.  Full denial letter will be uploaded to the media tab. See denial reason below.   PA #/Case ID/Reference #: [id number]

## 2023-11-30 ENCOUNTER — Ambulatory Visit: Payer: Self-pay | Admitting: Family Medicine

## 2023-12-02 NOTE — Telephone Encounter (Signed)
I left her a voicemail to schedule

## 2023-12-02 NOTE — Addendum Note (Signed)
 Addended by: ELOUISE POWELL HERO on: 12/02/2023 08:59 AM   Modules accepted: Orders

## 2023-12-02 NOTE — Telephone Encounter (Signed)
 PA is requiring an updated drug screen and controlled substance agreement. Please schedule patient for a lab appointment please. We will resubmit PA after drug screen and agreement

## 2023-12-03 ENCOUNTER — Encounter (HOSPITAL_BASED_OUTPATIENT_CLINIC_OR_DEPARTMENT_OTHER): Payer: Self-pay | Admitting: Physical Therapy

## 2023-12-03 ENCOUNTER — Ambulatory Visit (HOSPITAL_BASED_OUTPATIENT_CLINIC_OR_DEPARTMENT_OTHER): Admitting: Physical Therapy

## 2023-12-03 DIAGNOSIS — R2689 Other abnormalities of gait and mobility: Secondary | ICD-10-CM

## 2023-12-03 DIAGNOSIS — R29898 Other symptoms and signs involving the musculoskeletal system: Secondary | ICD-10-CM

## 2023-12-03 DIAGNOSIS — M25511 Pain in right shoulder: Secondary | ICD-10-CM

## 2023-12-03 DIAGNOSIS — M6281 Muscle weakness (generalized): Secondary | ICD-10-CM | POA: Diagnosis not present

## 2023-12-03 DIAGNOSIS — M79671 Pain in right foot: Secondary | ICD-10-CM

## 2023-12-03 NOTE — Therapy (Signed)
 OUTPATIENT PHYSICAL THERAPY  TREATMENT    Patient Name: SHARNIKA BINNEY MRN: [id number] DOB:December 01, 1960, 63 y.o., female Today's Date: 12/03/2023  END OF SESSION:  PT End of Session - 12/03/23 1531     Visit Number 5    Number of Visits 16    Date for Recertification  12/20/23    Authorization Type [employer] N[employer]    Authorization Time Period 9/20-11/15    Authorization - Number of Visits 16    PT Start Time 1517    PT Stop Time 1556    PT Time Calculation (min) 39 min    Activity Tolerance Patient tolerated treatment well    Behavior During Therapy [facility] for tasks assessed/performed             Past Medical History:  Diagnosis Date   Arthritis    Fibromyalgia    Hypertension    Past Surgical History:  Procedure Laterality Date   CESAREAN SECTION  1987   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   Family history of heart disease 12/26/2017   Statin intolerance 12/26/2017   Preventative health care 09/28/2017   Arthritis 09/28/2017   Essential hypertension 05/14/2016   Generalized anxiety disorder 05/14/2016    PCP: Antonio Cyndee Jamee JONELLE, DO  REFERRING PROVIDER: Antonio Cyndee Jamee JONELLE, DO  REFERRING DIAG: M15.0 (ICD-10-CM) - Primary osteoarthritis involving multiple joints  Rationale for Evaluation and Treatment: Rehabilitation  THERAPY DIAG:  Muscle weakness (generalized)  Other abnormalities of gait and mobility  Other symptoms and signs involving the musculoskeletal system  Right shoulder pain, unspecified  chronicity  Bilateral foot pain  ONSET DATE: Chronic  SUBJECTIVE:                                                                                                                                                                                           SUBJECTIVE STATEMENT:  Doing OK, they put me on Lyrica instead of doing the shots for my feet. Shoulder was a bit flared up after last time, can we focus more on my core today?    Eval: Patient states R arm pain. She states both feet are painful. Pain with laying on shoulder and difficulty sleeping because of it.  Patient states R shoulder acute on chronic pain which was irritated in April. Pain with lifting arm.   PERTINENT HISTORY:  Hx HTN, fibromyalgia, OA of multiple joints, CAD  PAIN:  Are you having pain? Yes: NPRS scale: 6/10 walking in  Pain location: feet  Pain description: stabbing/aching, freezing  Aggravating factors: movement, walking  Relieving factors: unclear   PRECAUTIONS: None  WEIGHT BEARING RESTRICTIONS: No  FALLS:  Has patient fallen in last 6 months? No  OCCUPATION: hair dresser  PLOF: Independent  PATIENT GOALS: be able to use arm, decrease pain; be able to walk on feet   OBJECTIVE: (objective measures from initial evaluation unless otherwise dated)  PATIENT SURVEYS:  Quick Dash:  QUICK DASH  Please rate your ability do the following activities in the last week by selecting the number below the appropriate response.   Activities Rating  Open a tight or new jar.  5 = Unable  Do heavy household chores (e.g., wash walls, floors). 4 = Severe difficulty  Carry a shopping bag or briefcase 3 = Moderate difficulty  Wash your back. 4 = Severe difficulty  Use a knife to cut food. 3 = Moderate difficulty  Recreational activities in which you take some force or impact through your arm, shoulder or hand (e.g., golf, hammering, tennis, etc.). 4 = Severe difficulty  During the past week, to what extent  has your arm, shoulder or hand problem interfered with your normal social activities with family, friends, neighbors or groups?  1 = Not at all  During the past week, were you limited in your work or other regular daily activities as a result of your arm, shoulder or hand problem? 4 = Very limited  Rate the severity of the following symptoms in the last week: Arm, Shoulder, or hand pain. 5 = Extreme  Rate the severity of the following symptoms in the last week: Tingling (pins and needles) in your arm, shoulder or hand. 1 = none  During the past week, how much difficulty have you had sleeping because of the pain in your arm, shoulder or hand?  5 = So much difficulty that I can't sleep   (A QuickDASH score may not be calculated if there is greater than 1 missing item.)  Quick Dash Disability/Symptom Score:63.6%  Minimally Clinically Important Difference (MCID): 15-20 points  (Franchignoni, F. et al. (2013). Minimally clinically important difference of the disabilities of the arm, shoulder, and hand outcome measures (DASH) and its shortened version (Quick DASH). Journal of Orthopaedic & Sports Physical Therapy, 44(1), 30-39)   COGNITION: Overall cognitive status: Within functional limits for tasks assessed     SENSATION: WFL for UE   POSTURE: rounded shoulders and forward head  PALPATION: Hypermobile AP/PA bilateral R>L , grossly TTP R shoulder  UPPER EXTREMITY ROM:   Active ROM Right 10/25/2023 Left 10/25/2023  Shoulder flexion [place] * with compensation   Shoulder extension    Shoulder abduction    Shoulder adduction    Shoulder internal rotation functional  T8* T5  Shoulder external rotation T4* T4  Elbow flexion    Elbow extension    Wrist flexion    Wrist extension    Wrist ulnar deviation    Wrist radial deviation    Wrist pronation    Wrist supination    (Blank rows = not tested) *= pain/symptoms  UPPER EXTREMITY MMT:  MMT Right 10/25/2023 Left 10/25/2023 11/26/23 R  11/26/23 L  Shoulder flexion 3+* 4- 3+ 4  Shoulder extension  Shoulder abduction   3+ 4  Shoulder adduction 3+* 4-    Shoulder internal rotation 4+* 5 5 5   Shoulder external rotation 3* 4+ 3- 4+  Middle trapezius      Lower trapezius      Elbow flexion 4+ 5    Elbow extension 4* 5    Wrist flexion      Wrist extension      Wrist ulnar deviation      Wrist radial deviation      Wrist pronation      Wrist supination      Grip strength (lbs)      (Blank rows = not tested) *= pain/symptoms   LOWER EXTREMITY ROM:   Test in upcoming session  Active  Right 11/12/23 Left 11/12/23  Hip flexion    Hip extension    Hip abduction    Hip adduction    Hip internal rotation    Hip external rotation    Knee flexion 129 132  Knee extension WNL WNL  Ankle dorsiflexion 1 1  Ankle plantarflexion 61 61  Ankle inversion 18 20  Ankle eversion 8 10   (Blank rows = not tested) * = pain/symptoms  LOWER EXTREMITY MMT:  Test in upcoming session  MMT Right 10/8 Left 10/8  Hip flexion    Hip extension    Hip abduction 4- 4-  Hip adduction    Hip internal rotation    Hip external rotation    Knee flexion 4+/5 4+/5  Knee extension 4/5 3+/5!  Ankle dorsiflexion 4+/5! 5/5!  Ankle plantarflexion 5/5! 4+/5!  Ankle inversion 5/5! 4+/5!  Ankle eversion 5/5! 4/5!   (Blank rows = not tested) * = pain/symptoms    GAIT: Distance walked: 100 feet Assistive device utilized: None Level of assistance: Complete Independence Comments:   TODAY'S TREATMENT:                                                                                                                              DATE:   12/03/23  UBE L1x3 min forward/3 min backward Shuttle LE press BLEs 75# x10, 87# x10 Shuttle press single leg 50# 2x10 B, 62# x10 B   PPT 15x3 seconds (2 rounds) Bridges + ABD into green TB x12  PPT + march x15  PPT + opposite UE/LE flexion x10 B Quadruped TA sets 10x3 seconds Sitting on gray air  pad: TA sets 10x3 seconds, TA set + alternating march x20, bicycles alternating x20, lateral trunk crunches x10 B Sidelying hip ABD green TB x12 B Walking bridges x10 Hip hikes x12 B Hip hikes + ABD x12 B      11/26/23  MMT for UEs, goals UBE L2.5 x3 min forward/3 min backwards  Hooklying TA set 12x3 seconds  Hooklying TA set + march x12 Wiggle worms for obliques supine x10 B alternating Bridges + ABD into red TB x12 Sidelying clams red TB x10 B Holding 2#  at 90*  + TA set + heels slides x5 B Scapular retraction red TB 2x12  Attempted shoulder extensions red TB  Shoulder flexion in pillowcase at door x12  2#  Shoulder abd in pillowcase at door x12 2# Door pushups to pt selected depth x10  Wall Ys 2# x10  Wall Is 2# x10  Wall Ws 2# x10     PATIENT EDUCATION:  Education details: Patient educated on exam findings, POC, scope of PT, HEP, and relevant anatomy/biomechanics. Person educated: Patient Education method: Explanation, Demonstration, and Handouts Education comprehension: verbalized understanding, returned demonstration, verbal cues required, and tactile cues required  HOME EXERCISE PROGRAM: Access Code: [email or web address] URL: [email or web address] Date: 10/25/2023 Prepared by: Prentice Zaunegger  Exercises - Supine Shoulder Flexion AAROM with Hands Clasped  - 2-3 x daily - 7 x weekly - 2 sets - 10 reps - Seated Scapular Retraction  - 2-3 x daily - 7 x weekly - 2 sets - 10 reps  ASSESSMENT:  CLINICAL IMPRESSION:  Arrives today doing OK, had a lot of shoulder pain after last visit. We focused more on core and hip strengthening per her request. Will continue to progress as tolerated.   Eval: Patient a 63 y.o. y.o. female who was seen today for physical therapy evaluation and treatment for R shoulder and bilateral feet pain. Patient presents with pain limited deficits in R shoulder and bilateral feet strength, ROM, endurance, activity tolerance, and  functional mobility with ADL. Patient is having to modify and restrict ADL as indicated by outcome measure score as well as subjective information and objective measures which is affecting overall participation. Patient will benefit from skilled physical therapy in order to improve function and reduce impairment.  OBJECTIVE IMPAIRMENTS: Abnormal gait, decreased activity tolerance, decreased balance, decreased endurance, decreased mobility, difficulty walking, decreased ROM, decreased strength, hypomobility, increased muscle spasms, impaired flexibility, impaired UE functional use, improper body mechanics, postural dysfunction, and pain  ACTIVITY LIMITATIONS: carrying, lifting, bending, standing, squatting, stairs, transfers, bed mobility, reach over head, locomotion level, and caring for others  PARTICIPATION LIMITATIONS:  meal prep, cleaning, laundry, shopping, community activity, occupation, and yard work  PERSONAL FACTORS: Time since onset of injury/illness/exacerbation and 3+ comorbidities: Hx HTN, fibromyalgia, OA of multiple joints, CAD are also affecting patient's functional outcome.   REHAB POTENTIAL: Fair    CLINICAL DECISION MAKING: Evolving/moderate complexity  EVALUATION COMPLEXITY: Moderate   GOALS: Goals reviewed with patient? Yes  SHORT TERM GOALS: Target date: 11/22/2023    Patient will be independent with HEP in order to improve functional outcomes. Baseline:  Goal status: IN PROGRESS 11/26/23  2.  Patient will report at least 25% improvement in symptoms for improved quality of life. Baseline:  Goal status: MET 11/26/23    LONG TERM GOALS: Target date: 12/20/2023    Patient will report at least 75% improvement in symptoms for improved quality of life. Baseline:  Goal status: INITIAL  2.  Patient will improve quick dash score by at least 20% in order to indicate improved tolerance to activity. Baseline:  Goal status: INITIAL  3.  Patient will demonstrate R  shoulder AROM with pain less than 2/10 for improved mobility/lifting. Baseline:  Goal status: INITIAL  4.   Patient will demonstrate grade of 4+/5 MMT grade in all UE tested musculature as evidence of improved strength to assist with lifting at home.  Baseline:  Goal status: INITIAL  5.  Patient will demonstrate grade of 4+/5 MMT grade in all LE tested musculature as evidence  of improved strength to assist with stair ambulation and gait.   Baseline:  Goal status: INITIAL     PLAN:  PT FREQUENCY: 2x/week  PT DURATION: 8 weeks  PLANNED INTERVENTIONS: 97164- PT Re-evaluation, 97110-Therapeutic exercises, 97530- Therapeutic activity, 97112- Neuromuscular re-education, 97535- Self Care, 02859- Manual therapy, [phone]- Gait training, [phone]- Orthotic Fit/training, [phone]- Canalith repositioning, [id number]- Aquatic Therapy, [phone]- Splinting, [phone]- Wound care (first 20 sq cm), 97598- Wound care (each additional 20 sq cm)Patient/Family education, Balance training, Stair training, Taping, Dry Needling, Joint mobilization, Joint manipulation, Spinal manipulation, Spinal mobilization, Scar mobilization, and DME instructions.  PLAN FOR NEXT SESSION: R shoulder and periscap strength progressions, assess bilateral feet pain and bilateral LE when able, core strength, proprioception in balance from core to feet. Per front, insurance will not cover land and water at the same time    Josette Rough, PT, DPT 12/03/23 3:56 PM

## 2023-12-05 ENCOUNTER — Ambulatory Visit (HOSPITAL_BASED_OUTPATIENT_CLINIC_OR_DEPARTMENT_OTHER): Admitting: Physical Therapy

## 2023-12-16 ENCOUNTER — Ambulatory Visit (HOSPITAL_BASED_OUTPATIENT_CLINIC_OR_DEPARTMENT_OTHER): Admitting: Physical Therapy

## 2023-12-16 ENCOUNTER — Other Ambulatory Visit: Payer: Self-pay

## 2023-12-16 DIAGNOSIS — N951 Menopausal and female climacteric states: Secondary | ICD-10-CM

## 2023-12-16 MED ORDER — MEDROXYPROGESTERONE ACETATE 2.5 MG PO TABS: 2.5000 mg | ORAL_TABLET | Freq: Every day | ORAL | 3 refills | Status: DC

## 2023-12-17 ENCOUNTER — Other Ambulatory Visit: Payer: Self-pay | Admitting: Family Medicine

## 2023-12-17 ENCOUNTER — Telehealth: Payer: Self-pay

## 2023-12-17 ENCOUNTER — Other Ambulatory Visit: Payer: Self-pay

## 2023-12-17 DIAGNOSIS — N951 Menopausal and female climacteric states: Secondary | ICD-10-CM

## 2023-12-17 NOTE — Telephone Encounter (Signed)
 I called this patient to explain why I canceled her prescription at the pharmacy. I informed her that she had denied experiencing night sweats or hot flashes, and therefore, she would need to speak with a provider to clarify the need for the medication. The patient stated that the medication was originally prescribed to help her sleep.  I explained that she could schedule either a video visit or an in-person visit to discuss this further and obtain a refill if appropriate. The patient declined at this time, stating that she would call back to speak with another clinician for a different opinion.  I reiterated that she is welcome to call and schedule an appointment, as nursing staff do not prescribe medications only physicians can. I offered to transfer her to the scheduler to arrange an appointment, but she declined.  Shawnee Fleet, CMA

## 2023-12-17 NOTE — Telephone Encounter (Signed)
 Copied from CRM [id number]. Topic: Clinical - Medication Refill >> Dec 17, 2023  3:48 PM Lauren C wrote: Medication:  estradiol  (ESTRACE ) 0.5 MG tablet medroxyPROGESTERone  (PROVERA ) 2.5 MG tablet  These were prescribed by Dr. Carson Sharps who moved to [place] . Pt has been out of meds for 5 days. She is wondering if Dr. Antonio Meth will assist with getting her these meds. She takes them for sleep and night sweats and has talked with her about this problem previously.   Has the patient contacted their pharmacy? No  This is the patient's preferred pharmacy:  [facility] [phone] [address], [place] - [address] [place] [address] Phone: [phone] Fax: [phone]  Is this the correct pharmacy for this prescription? Yes If no, delete pharmacy and type the correct one.   Has the prescription been filled recently? No  Is the patient out of the medication? Yes  Has the patient been seen for an appointment in the last year OR does the patient have an upcoming appointment? Yes  Can we respond through MyChart? Please call [phone]  Agent: Please be advised that Rx refills may take up to 3 business days. We ask that you follow-up with your pharmacy.

## 2023-12-17 NOTE — Telephone Encounter (Signed)
 Called the pharmacy to cancel the prescription for medroxyprogesterone  because the patient needs to be seen by the provider to discuss the issues she's having and the need for the hormonal medication. The pharmacy stated that they will cancel it.   Shawnee Fleet, CMA

## 2023-12-19 ENCOUNTER — Ambulatory Visit (HOSPITAL_BASED_OUTPATIENT_CLINIC_OR_DEPARTMENT_OTHER): Attending: Family Medicine | Admitting: Physical Therapy

## 2023-12-19 ENCOUNTER — Encounter (HOSPITAL_BASED_OUTPATIENT_CLINIC_OR_DEPARTMENT_OTHER): Payer: Self-pay | Admitting: Physical Therapy

## 2023-12-19 DIAGNOSIS — R29898 Other symptoms and signs involving the musculoskeletal system: Secondary | ICD-10-CM | POA: Diagnosis present

## 2023-12-19 DIAGNOSIS — M79671 Pain in right foot: Secondary | ICD-10-CM | POA: Diagnosis present

## 2023-12-19 DIAGNOSIS — M25511 Pain in right shoulder: Secondary | ICD-10-CM | POA: Diagnosis present

## 2023-12-19 DIAGNOSIS — M6281 Muscle weakness (generalized): Secondary | ICD-10-CM | POA: Diagnosis present

## 2023-12-19 DIAGNOSIS — M79672 Pain in left foot: Secondary | ICD-10-CM | POA: Diagnosis present

## 2023-12-19 DIAGNOSIS — R2689 Other abnormalities of gait and mobility: Secondary | ICD-10-CM | POA: Diagnosis present

## 2023-12-19 NOTE — Therapy (Signed)
 OUTPATIENT PHYSICAL THERAPY  TREATMENT    Patient Name: Sonya [name] MRN: [id number] DOB:May 24, 1960, 63 y.o., female Today's Date: 12/19/2023  END OF SESSION:  PT End of Session - 12/19/23 1550     Visit Number 6    Number of Visits 16    Date for Recertification  02/13/24    Authorization Type [employer] N[employer]    Authorization Time Period 9/20-11/15, new auth pending    Authorization - Visit Number 5    Authorization - Number of Visits 16    PT Start Time 1517    PT Stop Time 1556    PT Time Calculation (min) 39 min    Activity Tolerance Patient tolerated treatment well    Behavior During Therapy [facility] for tasks assessed/performed              Past Medical History:  Diagnosis Date   Arthritis    Fibromyalgia    Hypertension    Past Surgical History:  Procedure Laterality Date   CESAREAN SECTION  1987   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   Family history of heart disease 12/26/2017   Statin intolerance 12/26/2017   Preventative health care 09/28/2017   Arthritis 09/28/2017   Essential hypertension 05/14/2016   Generalized anxiety disorder 05/14/2016    PCP: Antonio Cyndee Jamee JONELLE, DO  REFERRING PROVIDER: Antonio Cyndee Jamee JONELLE, DO  REFERRING DIAG: M15.0 (ICD-10-CM) - Primary osteoarthritis involving multiple joints  Rationale for Evaluation and Treatment: Rehabilitation  THERAPY DIAG:  Muscle weakness (generalized)  Other abnormalities of gait and mobility  Other symptoms and signs involving the  musculoskeletal system  Right shoulder pain, unspecified chronicity  Bilateral foot pain  ONSET DATE: Chronic  SUBJECTIVE:                                                                                                                                                                                           SUBJECTIVE STATEMENT:  Things have been feeling about the same, feet aren't fabulous. Hadn't been to PT for a couple weeks due to having a hard time getting in with the schedules. Shoulder was doing better then I pulled some flowers/pulled weeds, that was about a week ago but  its still flared up.   Eval: Patient states R arm pain. She states both feet are painful. Pain with laying on shoulder and difficulty sleeping because of it. Patient states R shoulder acute on chronic pain which was irritated in April. Pain with lifting arm.   PERTINENT HISTORY:  Hx HTN, fibromyalgia, OA of multiple joints, CAD  PAIN:  Are you having pain? Yes: NPRS scale: 5/10 Pain location: feet, and R shoulder (but more feet) Pain description: stabbing/aching, freezing, won't bend   Aggravating factors: movement, walking  Relieving factors: unclear   PRECAUTIONS: None  WEIGHT BEARING RESTRICTIONS: No  FALLS:  Has patient fallen in last 6 months? No  OCCUPATION: hair dresser  PLOF: Independent  PATIENT GOALS: be able to use arm, decrease pain; be able to walk on feet   OBJECTIVE: (objective measures from initial evaluation unless otherwise dated)  PATIENT SURVEYS:  Quick Dash:  QUICK DASH  Please rate your ability do the following activities in the last week by selecting the number below the appropriate response.   Activities Rating eval  12/19/23  Open a tight or new jar.  5 = Unable   Do heavy household chores (e.g., wash walls, floors). 4 = Severe difficulty   Carry a shopping bag or briefcase 3 = Moderate difficulty   Wash your back. 4 = Severe difficulty   Use a knife to cut  food. 3 = Moderate difficulty   Recreational activities in which you take some force or impact through your arm, shoulder or hand (e.g., golf, hammering, tennis, etc.). 4 = Severe difficulty   During the past week, to what extent has your arm, shoulder or hand problem interfered with your normal social activities with family, friends, neighbors or groups?  1 = Not at all   During the past week, were you limited in your work or other regular daily activities as a result of your arm, shoulder or hand problem? 4 = Very limited   Rate the severity of the following symptoms in the last week: Arm, Shoulder, or hand pain. 5 = Extreme   Rate the severity of the following symptoms in the last week: Tingling (pins and needles) in your arm, shoulder or hand. 1 = none   During the past week, how much difficulty have you had sleeping because of the pain in your arm, shoulder or hand?  5 = So much difficulty that I can't sleep     63.6% at eval  79.5%   (A QuickDASH score may not be calculated if there is greater than 1 missing item.)  Quick Dash Disability/Symptom Score:63.6%  Minimally Clinically Important Difference (MCID): 15-20 points  Flavio, F. et al. (2013). Minimally clinically important difference of the disabilities of the arm, shoulder, and hand outcome measures (DASH) and its shortened version (Quick DASH). Journal of Orthopaedic & Sports Physical Therapy, 44(1), 30-39)   COGNITION: Overall cognitive status: Within functional limits for tasks assessed     SENSATION: WFL for UE   POSTURE: rounded shoulders and forward head  PALPATION: Hypermobile AP/PA bilateral R>L , grossly TTP R shoulder  UPPER EXTREMITY ROM:   Active ROM Right 10/25/2023 Left 10/25/2023  Shoulder flexion WFL * with compensation   Shoulder extension    Shoulder abduction    Shoulder adduction    Shoulder internal rotation functional  T8* T5  Shoulder external rotation T4* T4  Elbow flexion    Elbow  extension    Wrist flexion    Wrist extension  Wrist ulnar deviation    Wrist radial deviation    Wrist pronation    Wrist supination    (Blank rows = not tested) *= pain/symptoms  UPPER EXTREMITY MMT:  MMT Right 10/25/2023 Left 10/25/2023 11/26/23 R 11/26/23 L Right 12/19/23 Left 12/19/23  Shoulder flexion 3+* 4- 3+ 4 3+ 4  Shoulder extension        Shoulder abduction   3+ 4 3+ 4  Shoulder adduction 3+* 4-      Shoulder internal rotation 4+* 5 5 5 4 5   Shoulder external rotation 3* 4+ 3- 4+ 4- 5  Middle trapezius        Lower trapezius        Elbow flexion 4+ 5   4 5   Elbow extension 4* 5   4 5   Wrist flexion        Wrist extension        Wrist ulnar deviation        Wrist radial deviation        Wrist pronation        Wrist supination        Grip strength (lbs)        (Blank rows = not tested) *= pain/symptoms   LOWER EXTREMITY ROM:   Test in upcoming session  Active  Right 11/12/23 Left 11/12/23  Hip flexion    Hip extension    Hip abduction    Hip adduction    Hip internal rotation    Hip external rotation    Knee flexion 129 132  Knee extension WNL WNL  Ankle dorsiflexion 1 1  Ankle plantarflexion 61 61  Ankle inversion 18 20  Ankle eversion 8 10   (Blank rows = not tested) * = pain/symptoms  LOWER EXTREMITY MMT:  Test in upcoming session  MMT Right 10/8 Left 10/8 Right 12/19/23 Left 12/19/23  Hip flexion   3 3+  Hip extension   3- 3-  Hip abduction 4- 4- 3 4  Hip adduction      Hip internal rotation      Hip external rotation      Knee flexion 4+/5 4+/5 4 4   Knee extension 4/5 3+/5! 4 4  Ankle dorsiflexion 4+/5! 5/5!    Ankle plantarflexion 5/5! 4+/5!    Ankle inversion 5/5! 4+/5!    Ankle eversion 5/5! 4/5!     (Blank rows = not tested) * = pain/symptoms    GAIT: Distance walked: 100 feet Assistive device utilized: None Level of assistance: Complete Independence Comments:   TODAY'S TREATMENT:                                                                                                                               DATE:   12/19/23  MMT, QuickDASH, goals for reauth/insurance re-submission  Education on limited progress with PT and benefit of attempting water PT to address sx and function in her feet and shoulder  Hooklying TA sets 12x3 seconds PPT 12x3 seconds  Hooklying TA set + U clam x12 B Bridge + ABD into green TB x12 Standing marches green TB x10 B slow deliberate movement Standing hip ABD green TB x10 slow deliberate movement Standing hip extension green TB x10 slow deliberate movements On gray air pad- scap retraction + TA set x10, TA set + alternating march x10 B, TA set + forward punch/opposite LE march x10 B alternating  Modified paloff press red TB x10 B      PATIENT EDUCATION:  Education details: Patient educated on exam findings, POC, scope of PT, HEP, and relevant anatomy/biomechanics. Person educated: Patient Education method: Explanation, Demonstration, and Handouts Education comprehension: verbalized understanding, returned demonstration, verbal cues required, and tactile cues required  HOME EXERCISE PROGRAM: Access Code: [email or web address] Last update 12/19/23  ASSESSMENT:  CLINICAL IMPRESSION:  Arrives today doing OK, has not noticed a lot of changes with PT. Updated objectives and goals- unfortunately she is not making significant progress with skilled land PT services. As can be seen in objective measures, she has actually lost strength in upper and lower extremities, subjective reports of shoulder pain and function have also worsened as well. Additionally, per observation shoulder much more guarded with activity. At this point I do not feel that skilled land PT services are beneficial to her- however, it would be well worth attempting skilled water PT services to help address her pain, strength, core stability, and overall level of function.   Eval: Patient a 63 y.o. y.o. female who was seen  today for physical therapy evaluation and treatment for R shoulder and bilateral feet pain. Patient presents with pain limited deficits in R shoulder and bilateral feet strength, ROM, endurance, activity tolerance, and functional mobility with ADL. Patient is having to modify and restrict ADL as indicated by outcome measure score as well as subjective information and objective measures which is affecting overall participation. Patient will benefit from skilled physical therapy in order to improve function and reduce impairment.  OBJECTIVE IMPAIRMENTS: Abnormal gait, decreased activity tolerance, decreased balance, decreased endurance, decreased mobility, difficulty walking, decreased ROM, decreased strength, hypomobility, increased muscle spasms, impaired flexibility, impaired UE functional use, improper body mechanics, postural dysfunction, and pain  ACTIVITY LIMITATIONS: carrying, lifting, bending, standing, squatting, stairs, transfers, bed mobility, reach over head, locomotion level, and caring for others  PARTICIPATION LIMITATIONS:  meal prep, cleaning, laundry, shopping, community activity, occupation, and yard work  PERSONAL FACTORS: Time since onset of injury/illness/exacerbation and 3+ comorbidities: Hx HTN, fibromyalgia, OA of multiple joints, CAD are also affecting patient's functional outcome.   REHAB POTENTIAL: Fair    CLINICAL DECISION MAKING: Evolving/moderate complexity  EVALUATION COMPLEXITY: Moderate   GOALS: Goals reviewed with patient? Yes  SHORT TERM GOALS: Target date: 01/16/2024      Patient will be independent with HEP in order to improve functional outcomes. Baseline:  Goal status: IN PROGRESS 11/26/23  2.  Patient will report at least 25% improvement in symptoms for improved quality of life. Baseline:  Goal status: MET 11/26/23    LONG TERM GOALS: Target date: 02/13/2024    Patient will report at least 75% improvement in symptoms for improved quality of  life. Baseline:  Goal status: IN PROGRESS 12/19/23  2.  Patient will improve quick dash score by at least 20% in order to indicate improved tolerance to activity. Baseline:  Goal status: IN PROGRESS 12/19/23  3.  Patient will demonstrate R shoulder AROM with pain less than 2/10 for improved  mobility/lifting. Baseline:  Goal status: IN PROGRESS 12/19/23- per pt, can get to 9/10   4.   Patient will demonstrate grade of 4+/5 MMT grade in all UE tested musculature as evidence of improved strength to assist with lifting at home.  Baseline:  Goal status: IN PROGRESS 12/19/23  5.  Patient will demonstrate grade of 4+/5 MMT grade in all LE tested musculature as evidence of improved strength to assist with stair ambulation and gait.   Baseline:  Goal status: IN PROGRESS 12/19/23     PLAN:  PT FREQUENCY: 2x/week  PT DURATION: 8 weeks  PLANNED INTERVENTIONS: 97164- PT Re-evaluation, 97110-Therapeutic exercises, 97530- Therapeutic activity, 97112- Neuromuscular re-education, 97535- Self Care, [id number]- Manual therapy, [phone]- Gait training, [phone]- Orthotic Fit/training, [phone]- Canalith repositioning, [id number]- Aquatic Therapy, [phone]- Splinting, [phone]- Wound care (first 20 sq cm), 97598- Wound care (each additional 20 sq cm)Patient/Family education, Balance training, Stair training, Taping, Dry Needling, Joint mobilization, Joint manipulation, Spinal manipulation, Spinal mobilization, Scar mobilization, and DME instructions.  PLAN FOR NEXT SESSION: transition to water PT, land therapies have not been beneficial thus far    Josette Rough, PT, DPT 12/19/23 4:03 PM

## 2023-12-23 ENCOUNTER — Encounter (HOSPITAL_BASED_OUTPATIENT_CLINIC_OR_DEPARTMENT_OTHER): Admitting: Physical Therapy

## 2023-12-23 ENCOUNTER — Ambulatory Visit: Payer: Self-pay

## 2023-12-23 ENCOUNTER — Encounter

## 2023-12-23 ENCOUNTER — Other Ambulatory Visit: Payer: Self-pay | Admitting: Family Medicine

## 2023-12-23 ENCOUNTER — Telehealth: Payer: Self-pay | Admitting: Family Medicine

## 2023-12-23 DIAGNOSIS — Z006 Encounter for examination for normal comparison and control in clinical research program: Secondary | ICD-10-CM

## 2023-12-23 DIAGNOSIS — G8929 Other chronic pain: Secondary | ICD-10-CM

## 2023-12-23 NOTE — Telephone Encounter (Signed)
 Please advise

## 2023-12-23 NOTE — Telephone Encounter (Signed)
 Spoke w/ Pt- informed that PCP placed a referral to [facility]. Pt states she called Dr. Claudene at sports medicine for tomorrow, she now needs the referral updated. Marcelina- are you able to update from ortho to sports medicine.

## 2023-12-23 NOTE — Telephone Encounter (Signed)
 See chart/other telephone note- Pt requesting a referral to ortho. Awaiting PCP response.

## 2023-12-23 NOTE — Research (Addendum)
 Sonya [name] Pre-event Lpa screening 24-Dec-2023           LPA 462.0             [x] Clinically Significant  [] Not Clinically Significant   Vinie KYM Maxcy, MD, [facility], FNLA, FACP    [facility]  Medical Director of the Advanced Lipid Disorders &  Cardiovascular Risk Reduction Clinic Diplomate of the American Board of Clinical Lipidology Attending Cardiologist  Direct Dial: [phone]  Fax: [phone]  Website:  [email or web address]     Subject Name: Sonya [name]  Subject met inclusion and exclusion criteria.  The informed consent form, study requirements and expectations were reviewed with the subject and questions and concerns were addressed prior to the signing of the consent form.  The subject verbalized understanding of the trial requirements.  The subject agreed to participate in the pre-event trial  LPa screening and signed the informed consent at 1310 on 23-Dec-2023.  The informed consent was obtained prior to performance of any protocol-specific procedures for the subject.  A copy of the signed informed consent was given to the subject and a copy was placed in the subject's medical record.   Sonya [name], [place]    Pre-event  INCLUSION  Yes No  Participant has provided written informed consent before initiation of any study-specific activities/procedures. [x]  []   Age >= 50 years at the time of signing of the Lp(a) screening ICF. [x]  []   Lp(a) >= 200 nmol/L during Lp(a) screening by a central laboratory using an investigational IVD test. At least 4 weeks of stable and optimized lipid-lowering therapy consistent with regional/local clinical practice guidelines or according to investigator's judgment before Lp(a) screening.                                                                                                         _______________    Participants meeting at least one of the following categories (A or B):  A. Multiple risk  factors for atherosclerotic disease  1. Presence of >= 4 of any of the following ASCVD risk factors: a. Age >= 65 years men or women b. History of hypertension requiring pharmacotherapy c. Current cigarette tobacco smoking d. Diabetes mellitus (Type 1 or Type 2) requiring pharmacotherapy e. Screening high-sensitivity C-reactive protein (hs-CRP) >= 2.0 mg/L by central laboratory f. Screening estimated glomerular filtration rate (eGFR) 30 to < 60 mL/min/1.73 m2 by central laboratory g. Familial hypercholesterolemia  or family history of premature ASCVD (1st degree relative before age 20 years for males, and/or 1st degree relative before age 52 years for females)  AND/OR  B. History of atherosclerosis evidenced by:  1. Coronary Artery Disease-Reporting and Data System (CAD-RADS) P3, and/or 2. Coronary artery calcium  (CAC) > 300, and/or  CAC 633 3. Atherosclerosis defined by at least one of the following AND >= 2 additional risk factors listed in 104 A: a. Coronary atherosclerosis as evidenced by a stenosis >= 50% in at least one artery b. Coronary atherosclerosis as evidenced by a stenosis >= 25% (or reported as at least mild) in at least 2 arteries detected on invasive or noninvasive imaging c. Coronary atherosclerosis as evidenced by a CAC score > 100 and/or CAD-RADS >= P2 d. Carotid atherosclerosis as evidenced by an internal carotid stenosis >= 50% e. Peripheral atherosclerosis as evidenced by >= 50% stenosis of limb artery and/or ankle brachial index < 0.9                                                                     Bold the above patient meets.  [x]  []   EXCLUSION Yes No  Prior acute atherothrombotic qualifying event at any time, defined as prior myocardial infarction, prior stroke, prior transient ischemic attack, or prior acute limb ischemia. []  [x]   Prior arterial revascularization at any time suspected to be  associated with atherosclerosis. []  [x]   If available, participants without known atherosclerosis, CAC score = 0, CAD-RADS = 0, in the last 10 years prior to enrollment. []  [x]   Severe renal dysfunction, defined as an eGFR < 30 mL/min/1.73 m2 by central laboratory during screening []  [x]   History of decompensated liver cirrhosis and/or screening aspartate aminotransferase (AST) or alanine aminotransferase (ALT) > 3 x upper limit of normal (ULN), or total bilirubin (TBL) > 2 x ULN (except in stable Gilbert's syndrome). []  [x]   History of major bleeding disorder (eg, hemophilia, von Willebrand disease, clotting factor deficiencies, etc). []  [x]   Planned arterial revascularization (percutaneous or surgical). []  [x]   Fasting triglycerides > 400 mg/dL (5.47 mmol/L) during screening. []  [x]   Participant has known sensitivity to any of the products or components to be administered during dosing or the study procedures. []  [x]   Malignancy not in remission for at least 5 years prior to enrollment, exceptions for less than 5 years since remission include non-melanoma skin cancers, localized thyroid  cancer (papillary, follicular, and medullary), cervical in situ carcinoma, breast ductal carcinoma in situ, or stage 1 prostate carcinoma. []  [x]   Diagnosis of severe heart failure (New York  Heart Association Functional Classification IV), and/or if available, most recent left ventricular ejection fraction < 30%. []  [x]   Uncontrolled or recurrent ventricular tachycardia in the past 3 months before enrollment. []  [x]   Atrial fibrillation or flutter and not on an anticoagulant despite an indication to be anticoagulated.    Uncontrolled hypertension at screening, defined as systolic blood pressure > 180 mmHg or diastolic blood pressure > 110 mmHg at rest despite antihypertensive therapy. []  [x]   Diabetes mellitus (Type 1 or Type 2) with a hemoglobin A1C (HbA1c) >= 10% by central laboratory at screening. []  [x]    History or evidence of any other clinically significant disorder, condition, or disease (such as active infection) that, in the opinion of the investigator or [employer] physician, if consulted, would pose a risk to participant safety, or interfere with the study evaluation, procedures or completion. []  [x]   Current or planned lipoprotein apheresis or < 3 months since last apheresis treatment before enrollment. []  [x]   Participant has taken  a cholesteryl ester transfer protein inhibitor or lomitapide in the last 12 months before enrollment. []  [x]   Previously received any therapy specifically targeting Lp(a) including but not limited to, olpasiran, pelacarsen, lepodisiran, zerlasiran, or muvalaplin []  [x]    Currently receiving treatment in another investigational device or drug study, or less than 30 days since ending treatment on another investigational device or drug study(ies). Other investigational procedures while participating in this study are excluded. []  [x]   Participants of childbearing potential unwilling to use protocol-specified method of contraception during treatment and for an additional 30 days after the last dose of investigational product.  []  [x]   Participants who are breastfeeding or who plan to breastfeed while on study through 30 days after the last dose of investigational product []  [x]   Participants planning to become pregnant while on study through 30 days after the last dose of investigational product.  []  [x]   Participants of childbearing potential with a positive pregnancy test assessed at screening and/or day 1 by a highly sensitive urine or serum pregnancy test. []  [x]   Participant likely to not be available to complete all protocol-required study visits or procedures, and/or to comply with all required study procedures (eg, Clinical Outcome Assessments) to the best of the Participant and investigator's knowledge.                         []  [x]       Pre-Event Lp(a) Screening Visit  Date of Visit: 23-Dec-2023                                         Subject Number: [id number]  During this visit the following activities were completed:  [x] Reading, Signing and Understanding the informed Consent for Lp(a) screening  [x] Demographics  [x] Medical History  [x] Adverse events  [x] Serious adverse device effects  [x] Concomitant therapies reviewed  [x] Central labs      [x] Lp(a)   Sonya [name] is here for LPa screening visit part of Pre-event. She reports some pain in her right shoulder, but this has been going on for awhile. She saw her PCP today about this. No other pain noted. She was given time to read over the consent in a quit room. She also was given time to ask questions. Blood was drawn at 1335. Tol well. Reviewed meds. Informed her I will call her once the results of the blood work is back. Voices understanding.    Current Outpatient Medications:    ALPRAZolam  (XANAX ) 0.5 MG tablet, Take 1 tablet (0.5 mg total) by mouth 3 (three) times daily as needed., Disp: 30 tablet, Rfl: 1   amLODipine  (NORVASC ) 5 MG tablet, Take 1 tablet (5 mg total) by mouth daily., Disp: 90 tablet, Rfl: 3   aspirin EC 81 MG tablet, Take 81 mg by mouth daily. Swallow whole., Disp: , Rfl:    atomoxetine  (STRATTERA ) 25 MG capsule, Take 1 capsule (25 mg total) by mouth daily., Disp: 90 capsule, Rfl: 1   azelastine  (ASTELIN ) 0.1 % nasal spray, Place 2 sprays into both nostrils 2 (two) times daily. Use in each nostril as directed, Disp: 30 mL, Rfl: 3   Biotin 1000 MCG tablet, Take 1,000 mcg by mouth daily., Disp: , Rfl:    CALCIUM  CITRATE-VITAMIN D3 PO, Take 1 capsule by mouth daily., Disp: , Rfl:    Cholecalciferol (VITAMIN D3) 25 MCG (1000 UT) CAPS, Take 1  capsule by mouth daily., Disp: , Rfl:    [employer] OMEGA-3 KRILL OIL 500 MG CAPS, Take 1 capsule by mouth daily., Disp: , Rfl:    estradiol  (ESTRACE ) 0.5 MG tablet, Take 1 tablet (0.5 mg total) by mouth  daily., Disp: 90 tablet, Rfl: 3   Evolocumab  (REPATHA  SURECLICK) 140 MG/ML SOAJ, INJECT 140 MG INTO THE SKIN EVERY 14 (FOURTEEN) DAYS., Disp: 6 mL, Rfl: 3   Evolocumab  (REPATHA  SURECLICK) 140 MG/ML SOAJ, Inject 140 mg into the skin every 14 (fourteen) days., Disp: 1 mL, Rfl: 0   ezetimibe  (ZETIA ) 10 MG tablet, Take 10 mg by mouth daily., Disp: , Rfl:    guanFACINE  (TENEX ) 1 MG tablet, TAKE 1 TABLET BY MOUTH EVERYDAY AT BEDTIME, Disp: 90 tablet, Rfl: 1   hydrochlorothiazide  (HYDRODIURIL ) 25 MG tablet, Take 1 tablet (25 mg total) by mouth daily., Disp: 90 tablet, Rfl: 1   HYDROcodone -acetaminophen  (NORCO/VICODIN) 5-325 MG tablet, Take 1 tablet by mouth every 6 (six) hours as needed for moderate pain (pain score 4-6)., Disp: 45 tablet, Rfl: 0   lidocaine  (XYLOCAINE ) 2 % solution, Use as directed 15 mLs in the mouth or throat every 6 (six) hours as needed for mouth pain. Gargle and spit, Disp: 100 mL, Rfl: 0   LORazepam  (ATIVAN ) 0.5 MG tablet, Take 1 tablet (0.5 mg total) by mouth every 6 (six) hours as needed for anxiety., Disp: 90 tablet, Rfl: 1   MAGNESIUM OXIDE PO, Take 200 mg by mouth daily., Disp: , Rfl:    meclizine  (ANTIVERT ) 25 MG tablet, Take 1 tablet (25 mg total) by mouth 3 (three) times daily as needed for dizziness., Disp: 30 tablet, Rfl: 0   medroxyPROGESTERone  (PROVERA ) 2.5 MG tablet, Take 1 tablet (2.5 mg total) by mouth daily., Disp: 90 tablet, Rfl: 3   methocarbamol  (ROBAXIN ) 500 MG tablet, TAKE 1 TABLET BY MOUTH FOUR TIMES A DAY, Disp: 30 tablet, Rfl: 1   Phosphatidylserine 100 MG CAPS, Take 1 capsule by mouth daily., Disp: , Rfl:    pregabalin  (LYRICA ) 25 MG capsule, Take 1 capsule (25 mg total) by mouth 2 (two) times daily., Disp: 60 capsule, Rfl: 2   scopolamine  (TRANSDERM-SCOP) 1 MG/3DAYS, Place 1 patch (1.5 mg total) onto the skin every 3 (three) days., Disp: 10 patch, Rfl: 12   Turmeric Curcumin 500 MG CAPS, Take 1 capsule by mouth daily., Disp: , Rfl:    Ubiquinol 100 MG CAPS,  Take 1 mg by mouth daily., Disp: , Rfl:

## 2023-12-23 NOTE — Telephone Encounter (Signed)
 Copied from CRM #[id number]. Topic: Referral - Request for Referral >> Dec 23, 2023  9:47 AM Franky GRADE wrote: Did the patient discuss referral with their provider in the last year? Yes (If No - schedule appointment) (If Yes - send message)  Appointment offered? Yes  Type of order/referral and detailed reason for visit: Ortho for right shoulder pain, Physical Therapy isn't working and seems to be making it worse.   Preference of office, provider, location: Any provider in network with her insurance.   If referral order, have you been seen by this specialty before? No (If Yes, this issue or another issue? When? Where?  Can we respond through MyChart? Yes

## 2023-12-23 NOTE — Telephone Encounter (Signed)
 FYI Only or Action Required?: Action required by provider: referral request. Pt would like to get an injection in shoulder for pain which has increased substantially since PT. Pt would like to go to a sports medicine provider, who does these shots all the time. Pt would like to be seen today if possible.   Patient was last seen in primary care on 11/21/2023 by Antonio Meth, Jamee SAUNDERS, DO.  Called Nurse Triage reporting Shoulder Pain.  Symptoms began ongoing - worsening.  Interventions attempted: Other: seen at PCP, has had PT.  Symptoms are: rapidly worsening.  Triage Disposition: See Physician Within 24 Hours  Patient/caregiver understands and will follow disposition?: No, wishes to speak with PCP                         Copied from CRM #[id number]. Topic: Clinical - Red Word Triage >> Dec 23, 2023  9:49 AM Franky GRADE wrote: Red Word that prompted transfer to Nurse Triage: Patient is experiencing worsening right shoulder pain, She has done the physical therapy as requested by the provider but that did not improve the symptoms but made it worse. Reason for Disposition  [1] Unable to use arm at all AND [2] because of shoulder pain or stiffness  Answer Assessment - Initial Assessment Questions 1. ONSET: When did the pain start?     Ongoing 2. LOCATION: Where is the pain located?     Right shoulder pain 3. PAIN: How bad is the pain? (Scale 1-10; or mild, moderate, severe)     severe 4. WORK OR EXERCISE: Has there been any recent work or exercise that involved this part of the body?     Arthritis 5. CAUSE: What do you think is causing the shoulder pain?     Arthritis 6. OTHER SYMPTOMS: Do you have any other symptoms? (e.g., neck pain, swelling, rash, fever, numbness, weakness)     no  Protocols used: Shoulder Pain-A-AH

## 2023-12-23 NOTE — Progress Notes (Unsigned)
 [facility] [address] [place] [address] Phone: [phone] Subjective:   LILLETTE Berwyn Posey, am serving as a scribe for Dr. Arthea Claudene.  I'm seeing this patient by the request  of:  Antonio Cyndee Jamee JONELLE, DO  CC: Right shoulder pain  YEP:Dlagzrupcz  AYISHA POL is a 63 y.o. female coming in with complaint of R shoulder and B foot pain. Pain in midfoot is sharp especially with weight bearing. Toes goes numb in bed as well. Pain on top of both feet. Tried Lyrica but it has not been helpful.   Pain both shoulders R>L. Pain in anterior aspect. 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   Xrays from [facility] in [facility] Everywhere.      Past Medical History:  Diagnosis Date   Arthritis    Fibromyalgia    Hypertension    Past Surgical History:  Procedure Laterality Date   CESAREAN SECTION  1987   Social History   Socioeconomic History   Marital status: Legally Separated    Spouse name: Not on file   Number of children: Not on file   Years of education: Not on file   Highest education level: Not on file  Occupational History   Occupation: producer, television/film/video  Tobacco Use   Smoking status: Never   Smokeless tobacco: Never  Vaping Use   Vaping status: Never Used  Substance and Sexual Activity   Alcohol use: No   Drug use: No   Sexual activity: Not Currently    Partners: Male    Birth control/protection: None  Other Topics Concern   Not on file  Social History Narrative   Walks occasionally---  has 2 little dogs   Social Drivers of Corporate Investment Banker Strain: Not on file  Food Insecurity: Not on file  Transportation Needs: Not on file  Physical Activity: Not on file  Stress: Not on file  Social Connections: Not on file   Allergies  Allergen Reactions   Atorvastatin  Other (See Comments)    Leg cramps   Livalo  [Pitavastatin ] Other (See Comments)    Flu like symptoms - 2 mg   Praluent  [Alirocumab ]     Joint pain    Pravastatin  Other (See Comments)    40 mg -myalgias   Zetia  [Ezetimibe ] Other (See Comments)    Muscle Aches   Family History  Problem Relation Age of Onset   Arthritis Mother    Hyperlipidemia Mother    Hypertension Mother    Diabetes Mother    Cancer - Other Mother        vulvar   Aortic stenosis Mother    Lung cancer Mother    Cancer - Lung Mother    Arthritis Father    Hyperlipidemia Father    Heart failure Father        Hx disease   Hypertension Father    Stroke Father    Prostate cancer Father    Macular degeneration Father    Alzheimer's disease Father    Dementia Father    Alcohol abuse Sister    Subarachnoid hemorrhage Sister    Heart attack Brother    Stomach cancer Maternal Grandmother    Stroke Paternal Grandfather    Lung cancer Paternal Grandfather        smoker   Healthy Son    Healthy Son     Current Outpatient Medications (Endocrine & Metabolic):    estradiol  (ESTRACE ) 0.5 MG  tablet, Take 1 tablet (0.5 mg total) by mouth daily.   medroxyPROGESTERone  (PROVERA ) 2.5 MG tablet, Take 1 tablet (2.5 mg total) by mouth daily.  Current Outpatient Medications (Cardiovascular):    amLODipine  (NORVASC ) 5 MG tablet, Take 1 tablet (5 mg total) by mouth daily.   Evolocumab  (REPATHA  SURECLICK) 140 MG/ML SOAJ, INJECT 140 MG INTO THE SKIN EVERY 14 (FOURTEEN) DAYS.   Evolocumab  (REPATHA  SURECLICK) 140 MG/ML SOAJ, Inject 140 mg into the skin every 14 (fourteen) days.   ezetimibe  (ZETIA ) 10 MG tablet, Take 10 mg by mouth daily.   guanFACINE  (TENEX ) 1 MG tablet, TAKE 1 TABLET BY MOUTH EVERYDAY AT BEDTIME   hydrochlorothiazide  (HYDRODIURIL ) 25 MG tablet, Take 1 tablet (25 mg total) by mouth daily.  Current Outpatient Medications (Respiratory):    azelastine  (ASTELIN ) 0.1 % nasal spray, Place 2 sprays into both nostrils 2 (two) times daily. Use in each nostril as directed  Current Outpatient Medications (Analgesics):    aspirin EC 81 MG tablet, Take 81 mg by mouth  daily. Swallow whole.   HYDROcodone -acetaminophen  (NORCO/VICODIN) 5-325 MG tablet, Take 1 tablet by mouth every 6 (six) hours as needed for moderate pain (pain score 4-6).   Current Outpatient Medications (Other):    ALPRAZolam  (XANAX ) 0.5 MG tablet, Take 1 tablet (0.5 mg total) by mouth 3 (three) times daily as needed.   atomoxetine  (STRATTERA ) 25 MG capsule, Take 1 capsule (25 mg total) by mouth daily.   Biotin 1000 MCG tablet, Take 1,000 mcg by mouth daily.   CALCIUM  CITRATE-VITAMIN D3 PO, Take 1 capsule by mouth daily.   Cholecalciferol (VITAMIN D3) 25 MCG (1000 UT) CAPS, Take 1 capsule by mouth daily.   [employer] OMEGA-3 KRILL OIL 500 MG CAPS, Take 1 capsule by mouth daily.   lidocaine  (XYLOCAINE ) 2 % solution, Use as directed 15 mLs in the mouth or throat every 6 (six) hours as needed for mouth pain. Gargle and spit   LORazepam  (ATIVAN ) 0.5 MG tablet, Take 1 tablet (0.5 mg total) by mouth every 6 (six) hours as needed for anxiety.   MAGNESIUM OXIDE PO*, Take 200 mg by mouth daily.   meclizine  (ANTIVERT ) 25 MG tablet, Take 1 tablet (25 mg total) by mouth 3 (three) times daily as needed for dizziness.   methocarbamol  (ROBAXIN ) 500 MG tablet, TAKE 1 TABLET BY MOUTH FOUR TIMES A DAY   Phosphatidylserine 100 MG CAPS, Take 1 capsule by mouth daily.   pregabalin (LYRICA) 25 MG capsule, Take 1 capsule (25 mg total) by mouth 2 (two) times daily.   scopolamine  (TRANSDERM-SCOP) 1 MG/3DAYS, Place 1 patch (1.5 mg total) onto the skin every 3 (three) days.   Turmeric Curcumin 500 MG CAPS, Take 1 capsule by mouth daily.   Ubiquinol 100 MG CAPS, Take 1 mg by mouth daily. * These medications belong to multiple therapeutic classes and are listed under each applicable group.   Reviewed prior external information including notes and imaging from  primary care provider As well as notes that were available from care everywhere and other healthcare systems.  Past medical history, social, surgical and family  history all reviewed in electronic medical record.  No pertanent information unless stated regarding to the chief complaint.   Review of Systems:  No headache, visual changes, nausea, vomiting, diarrhea, constipation, dizziness, abdominal pain, skin rash, fevers, chills, night sweats, weight loss, swollen lymph nodes, body aches, joint swelling, chest pain, shortness of breath, mood changes. POSITIVE muscle aches  Objective  Blood pressure 104/60, pulse ROLLEN)  52, height 5' 4 (1.626 m), last menstrual period 02/23/2016, SpO2 98%.   General: No apparent distress alert and oriented x3 mood and affect normal, dressed appropriately.  HEENT: Pupils equal, extraocular movements intact  Respiratory: Patient's speak in full sentences and does not appear short of breath  Cardiovascular: No lower extremity edema, non tender, no erythema  Right shoulder exam shows positive impingement noted.  Some arthritic changes noted.  Mild crepitus.  Rotator cuff strength though does appear to be intact.   Limited muscular skeletal ultrasound was performed and interpreted by CLAUDENE HUSSAR, M  Limited ultrasound shows that there is hypoechoic changes that is significant in the acromioclavicular joint on the right side with significant calcific deposits noted in moderate to severe narrowing noted.  Patient also has hypoechoic changes consistent with fairly severe tendinitis of the rotator cuff diffusely.  Some also cortical irregularity noted consistent with some arthritic changes of the glenohumeral joint.  Procedure: Real-time Ultrasound Guided Injection of right glenohumeral joint Device: GE Logiq Q7  Ultrasound guided injection is preferred based studies that show increased duration, increased effect, greater accuracy, decreased procedural pain, increased response rate with ultrasound guided versus blind injection.  Verbal informed consent obtained.  Time-out conducted.  Noted no overlying erythema, induration,  or other signs of local infection.  Skin prepped in a sterile fashion.  Local anesthesia: Topical Ethyl chloride.  With sterile technique and under real time ultrasound guidance:  Joint visualized.  23g 1  inch needle inserted posterior approach. Pictures taken for needle placement. Patient did have injection of 2 cc of 1% lidocaine , 2 cc of 0.5% Marcaine, and 1.0 cc of Kenalog  40 mg/dL. Completed without difficulty  Pain immediately resolved suggesting accurate placement of the medication.  Advised to call if fevers/chills, erythema, induration, drainage, or persistent bleeding.  Images saved Impression: Technically successful ultrasound guided injection.  Procedure: Real-time Ultrasound Guided Injection of right acromioclavicular joint Device: GE Logiq Q7 Ultrasound guided injection is preferred based studies that show increased duration, increased effect, greater accuracy, decreased procedural pain, increased response rate, and decreased cost with ultrasound guided versus blind injection.  Verbal informed consent obtained.  Time-out conducted.  Noted no overlying erythema, induration, or other signs of local infection.  Skin prepped in a sterile fashion.  Local anesthesia: Topical Ethyl chloride.  With sterile technique and under real time ultrasound guidance: With a 25-gauge half inch needle injected with 0.5 cc of 0.5% Marcaine and 0.5 cc of Kenalog  40 mg/mL. Completed without difficulty  Pain immediately resolved suggesting accurate placement of the medication.  Advised to call if fevers/chills, erythema, induration, drainage, or persistent bleeding.  Images saved Impression: Technically successful ultrasound guided injection.   Impression and Recommendations:    The above documentation has been reviewed and is accurate and complete Kaiah Hosea M Shanard Treto, DO

## 2023-12-23 NOTE — Telephone Encounter (Signed)
 Pt wants referral sent to [facility], Zach Smith please.

## 2023-12-24 ENCOUNTER — Other Ambulatory Visit: Payer: Self-pay

## 2023-12-24 ENCOUNTER — Encounter: Payer: Self-pay | Admitting: Family Medicine

## 2023-12-24 ENCOUNTER — Ambulatory Visit: Admitting: Family Medicine

## 2023-12-24 VITALS — BP 104/60 | HR 52 | Ht 64.0 in

## 2023-12-24 DIAGNOSIS — M25511 Pain in right shoulder: Secondary | ICD-10-CM

## 2023-12-24 DIAGNOSIS — R5383 Other fatigue: Secondary | ICD-10-CM

## 2023-12-24 DIAGNOSIS — G8929 Other chronic pain: Secondary | ICD-10-CM | POA: Diagnosis not present

## 2023-12-24 DIAGNOSIS — M19011 Primary osteoarthritis, right shoulder: Secondary | ICD-10-CM

## 2023-12-24 DIAGNOSIS — M19019 Primary osteoarthritis, unspecified shoulder: Secondary | ICD-10-CM | POA: Insufficient documentation

## 2023-12-24 NOTE — Assessment & Plan Note (Signed)
 Patient given injection and tolerated the procedure well, discussed icing regimen and home exercises, discussed which activities to do and which ones to avoid.  Increase activity slowly.  Further evaluating for any uric acid deposits that could be potentially contributing.  Follow-up again in 6 to 12 weeks

## 2023-12-24 NOTE — Assessment & Plan Note (Signed)
 Significant arthritic changes for patient's age.  No concerned that this is more than just osteoarthritic changes.  Some more laboratory workup ordered today to further evaluate.  Discussed icing regimen, discussed home exercises for strengthening and ergonomic changes that patient can make over the course of time.  Increase activity slowly.  Discussed icing regimen.  Follow-up again in 6 to 12 weeks

## 2023-12-24 NOTE — Patient Instructions (Addendum)
 Labs today Injected shoulder in 2 spots Spenco Total Support Orthotics Original Recovery sandals  Gravity defyer shoes See you again in 10 weeks

## 2023-12-26 ENCOUNTER — Encounter (HOSPITAL_BASED_OUTPATIENT_CLINIC_OR_DEPARTMENT_OTHER): Admitting: Physical Therapy

## 2023-12-26 ENCOUNTER — Other Ambulatory Visit (INDEPENDENT_AMBULATORY_CARE_PROVIDER_SITE_OTHER)

## 2023-12-26 DIAGNOSIS — R5383 Other fatigue: Secondary | ICD-10-CM | POA: Diagnosis not present

## 2023-12-26 DIAGNOSIS — M25511 Pain in right shoulder: Secondary | ICD-10-CM

## 2023-12-26 LAB — SEDIMENTATION RATE: Sed Rate: 9 mm/h (ref 0–30)

## 2023-12-26 LAB — IBC PANEL
Iron: 87 ug/dL (ref 42–145)
Saturation Ratios: 27.6 % (ref 20.0–50.0)
TIBC: 315 ug/dL (ref 250.0–450.0)
Transferrin: 225 mg/dL (ref 212.0–360.0)

## 2023-12-26 LAB — URIC ACID: Uric Acid, Serum: 3.5 mg/dL (ref 2.4–7.0)

## 2023-12-26 LAB — FERRITIN: Ferritin: 35.4 ng/mL (ref 10.0–291.0)

## 2023-12-28 ENCOUNTER — Ambulatory Visit: Payer: Self-pay | Admitting: Family Medicine

## 2023-12-29 ENCOUNTER — Encounter (HOSPITAL_BASED_OUTPATIENT_CLINIC_OR_DEPARTMENT_OTHER)

## 2023-12-29 ENCOUNTER — Encounter: Payer: Self-pay | Admitting: *Deleted

## 2023-12-29 DIAGNOSIS — Z006 Encounter for examination for normal comparison and control in clinical research program: Secondary | ICD-10-CM

## 2023-12-29 NOTE — Research (Signed)
 Spoke with Sonya [name] about her lab result LPa of [phone]. Scheduled her to come in for main screening visit of pre-event on Dec 11 at 1200.

## 2023-12-30 LAB — PROTEIN ELECTROPHORESIS, SERUM
Albumin ELP: 4 g/dL (ref 3.8–4.8)
Alpha 1: 0.3 g/dL (ref 0.2–0.3)
Alpha 2: 0.5 g/dL (ref 0.5–0.9)
Beta 2: 0.2 g/dL (ref 0.2–0.5)
Beta Globulin: 0.4 g/dL (ref 0.4–0.6)
Gamma Globulin: 0.7 g/dL — ABNORMAL LOW (ref 0.8–1.7)
Total Protein: 6.2 g/dL (ref 6.1–8.1)

## 2023-12-30 LAB — PTH, INTACT AND CALCIUM
Calcium: 9.2 mg/dL (ref 8.6–10.4)
PTH: 18 pg/mL (ref 16–77)

## 2023-12-30 LAB — ANTI-DNA ANTIBODY, DOUBLE-STRANDED: ds DNA Ab: <1 [IU]/mL

## 2023-12-31 ENCOUNTER — Encounter (HOSPITAL_BASED_OUTPATIENT_CLINIC_OR_DEPARTMENT_OTHER): Admitting: Physical Therapy

## 2024-01-06 ENCOUNTER — Encounter (HOSPITAL_BASED_OUTPATIENT_CLINIC_OR_DEPARTMENT_OTHER): Payer: Self-pay | Admitting: Physical Therapy

## 2024-01-06 ENCOUNTER — Ambulatory Visit (HOSPITAL_BASED_OUTPATIENT_CLINIC_OR_DEPARTMENT_OTHER): Payer: Self-pay | Attending: Family Medicine | Admitting: Physical Therapy

## 2024-01-06 DIAGNOSIS — M6281 Muscle weakness (generalized): Secondary | ICD-10-CM | POA: Insufficient documentation

## 2024-01-06 DIAGNOSIS — M79671 Pain in right foot: Secondary | ICD-10-CM | POA: Diagnosis present

## 2024-01-06 DIAGNOSIS — R2689 Other abnormalities of gait and mobility: Secondary | ICD-10-CM | POA: Diagnosis present

## 2024-01-06 DIAGNOSIS — M25511 Pain in right shoulder: Secondary | ICD-10-CM | POA: Diagnosis present

## 2024-01-06 DIAGNOSIS — M79672 Pain in left foot: Secondary | ICD-10-CM | POA: Insufficient documentation

## 2024-01-06 DIAGNOSIS — R29898 Other symptoms and signs involving the musculoskeletal system: Secondary | ICD-10-CM | POA: Insufficient documentation

## 2024-01-06 NOTE — Therapy (Signed)
 OUTPATIENT PHYSICAL THERAPY  TREATMENT    Patient Name: Sonya [name] MRN: [id number] DOB:1960-12-10, 63 y.o., female Today's Date: 01/06/2024  END OF SESSION:  PT End of Session - 01/06/24 1448     Visit Number 7    Number of Visits 16    Date for Recertification  02/13/24    Authorization Type [employer]    Authorization Time Period 12/22/23-02/13/24    Authorization - Visit Number 1    Authorization - Number of Visits 10    PT Start Time 1446    PT Stop Time 1525    PT Time Calculation (min) 39 min    Behavior During Therapy [facility] for tasks assessed/performed              Past Medical History:  Diagnosis Date   Arthritis    Fibromyalgia    Hypertension    Past Surgical History:  Procedure Laterality Date   CESAREAN SECTION  1987   Patient Active Problem List   Diagnosis Date Noted   AC (acromioclavicular) arthritis 12/24/2023   Multiple joint pain 08/28/2023   Chronic right shoulder pain 08/28/2023   Primary osteoarthritis involving multiple joints 08/28/2023   Other fatigue 08/28/2023   Primary osteoarthritis of both hands 06/20/2019   Primary osteoarthritis of both knees 06/20/2019   Primary osteoarthritis of both feet 06/20/2019   Agatston coronary artery calcium  score greater than 400 04/20/2018   Familial hyperlipidemia 12/26/2017   Family history of heart disease 12/26/2017   Statin intolerance 12/26/2017   Preventative health care 09/28/2017   Arthritis 09/28/2017   Essential hypertension 05/14/2016   Generalized anxiety disorder 05/14/2016    PCP: Antonio Cyndee Jamee JONELLE, DO  REFERRING PROVIDER: Antonio Cyndee Jamee JONELLE, DO  REFERRING DIAG: M15.0 (ICD-10-CM) - Primary osteoarthritis involving multiple joints  Rationale for Evaluation and Treatment: Rehabilitation  THERAPY DIAG:  Muscle weakness (generalized)  Other abnormalities of gait and mobility  Other symptoms and signs involving the musculoskeletal system  Right  shoulder pain, unspecified chronicity  Bilateral foot pain  ONSET DATE: Chronic  SUBJECTIVE:                                                                                                                                                                                           SUBJECTIVE STATEMENT: Did Deep water aerobics at [employer] until last December; but had to stop due to finances.  Had injection Rt shoulder 2 weeks ago. Pt reports that she did not tolerate land exercise.    Eval: Patient states R arm pain. She states both feet are painful. Pain with laying on  shoulder and difficulty sleeping because of it. Patient states R shoulder acute on chronic pain which was irritated in April. Pain with lifting arm.   PERTINENT HISTORY:  Hx HTN, fibromyalgia, OA of multiple joints, CAD  PAIN:  Are you having pain? Yes: NPRS scale: 5/10 Pain location: feet, and R shoulder (but more feet) Pain description: stabbing/aching, freezing, won't bend   Aggravating factors: movement, walking  Relieving factors: unclear   PRECAUTIONS: None  WEIGHT BEARING RESTRICTIONS: No  FALLS:  Has patient fallen in last 6 months? No  OCCUPATION: hair dresser  PLOF: Independent  PATIENT GOALS: be able to use arm, decrease pain; be able to walk on feet   OBJECTIVE: (objective measures from initial evaluation unless otherwise dated)  PATIENT SURVEYS:  Quick Dash:  QUICK DASH  Please rate your ability do the following activities in the last week by selecting the number below the appropriate response.   Activities Rating eval  12/19/23  Open a tight or new jar.  5 = Unable   Do heavy household chores (e.g., wash walls, floors). 4 = Severe difficulty   Carry a shopping bag or briefcase 3 = Moderate difficulty   Wash your back. 4 = Severe difficulty   Use a knife to cut food. 3 = Moderate difficulty   Recreational activities in which you take some force or impact through your arm, shoulder  or hand (e.g., golf, hammering, tennis, etc.). 4 = Severe difficulty   During the past week, to what extent has your arm, shoulder or hand problem interfered with your normal social activities with family, friends, neighbors or groups?  1 = Not at all   During the past week, were you limited in your work or other regular daily activities as a result of your arm, shoulder or hand problem? 4 = Very limited   Rate the severity of the following symptoms in the last week: Arm, Shoulder, or hand pain. 5 = Extreme   Rate the severity of the following symptoms in the last week: Tingling (pins and needles) in your arm, shoulder or hand. 1 = none   During the past week, how much difficulty have you had sleeping because of the pain in your arm, shoulder or hand?  5 = So much difficulty that I can't sleep     63.6% at eval  79.5%   (A QuickDASH score may not be calculated if there is greater than 1 missing item.)  Quick Dash Disability/Symptom Score:63.6%  Minimally Clinically Important Difference (MCID): 15-20 points  Flavio, F. et al. (2013). Minimally clinically important difference of the disabilities of the arm, shoulder, and hand outcome measures (DASH) and its shortened version (Quick DASH). Journal of Orthopaedic & Sports Physical Therapy, 44(1), 30-39)   COGNITION: Overall cognitive status: Within functional limits for tasks assessed     SENSATION: WFL for UE   POSTURE: rounded shoulders and forward head  PALPATION: Hypermobile AP/PA bilateral R>L , grossly TTP R shoulder  UPPER EXTREMITY ROM:   Active ROM Right 10/25/2023 Left 10/25/2023  Shoulder flexion WFL * with compensation   Shoulder extension    Shoulder abduction    Shoulder adduction    Shoulder internal rotation functional  T8* T5  Shoulder external rotation T4* T4  Elbow flexion    Elbow extension    Wrist flexion    Wrist extension    Wrist ulnar deviation    Wrist radial deviation    Wrist pronation     Wrist supination    (  Blank rows = not tested) *= pain/symptoms  UPPER EXTREMITY MMT:  MMT Right 10/25/2023 Left 10/25/2023 11/26/23 R 11/26/23 L Right 12/19/23 Left 12/19/23  Shoulder flexion 3+* 4- 3+ 4 3+ 4  Shoulder extension        Shoulder abduction   3+ 4 3+ 4  Shoulder adduction 3+* 4-      Shoulder internal rotation 4+* 5 5 5 4 5   Shoulder external rotation 3* 4+ 3- 4+ 4- 5  Middle trapezius        Lower trapezius        Elbow flexion 4+ 5   4 5   Elbow extension 4* 5   4 5   Wrist flexion        Wrist extension        Wrist ulnar deviation        Wrist radial deviation        Wrist pronation        Wrist supination        Grip strength (lbs)        (Blank rows = not tested) *= pain/symptoms   LOWER EXTREMITY ROM:   Test in upcoming session  Active  Right 11/12/23 Left 11/12/23  Hip flexion    Hip extension    Hip abduction    Hip adduction    Hip internal rotation    Hip external rotation    Knee flexion 129 132  Knee extension WNL WNL  Ankle dorsiflexion 1 1  Ankle plantarflexion 61 61  Ankle inversion 18 20  Ankle eversion 8 10   (Blank rows = not tested) * = pain/symptoms  LOWER EXTREMITY MMT:  Test in upcoming session  MMT Right 10/8 Left 10/8 Right 12/19/23 Left 12/19/23  Hip flexion   3 3+  Hip extension   3- 3-  Hip abduction 4- 4- 3 4  Hip adduction      Hip internal rotation      Hip external rotation      Knee flexion 4+/5 4+/5 4 4   Knee extension 4/5 3+/5! 4 4  Ankle dorsiflexion 4+/5! 5/5!    Ankle plantarflexion 5/5! 4+/5!    Ankle inversion 5/5! 4+/5!    Ankle eversion 5/5! 4/5!     (Blank rows = not tested) * = pain/symptoms    GAIT: Distance walked: 100 feet Assistive device utilized: None Level of assistance: Complete Independence Comments:   TODAY'S TREATMENT:                                                                                                                              [facility] Adult PT Treatment:                                              Date: 01/06/24 Pt seen for aquatic  therapy today.  Treatment took place in water 3.5-4.75 ft in depth at the [facility] pool. Temp of water was 91.  Pt entered/exited the pool via stairs independently with bil rail.  - Intro to aquatic therapy principles - unsupported walking forward/ backward, multiple laps, cues for vertical trunk and reciprocal arm swing - unsupported side stepping -> arm add/abdct with short hollow noodle -> horz abdct/add with/without short hollow noodles - UE on yellow hand floats:  toe/heel raises x10; hip add/abd x10 ; hip flexion /extension x10;  - standing balance on hollow noodle -> mini squats -> slow marching  - SLS noodle stomp without UE support with hollow noodle x 10 each LE - TrA set with short hollow -> full hollow noodle pull down to thighs in standard and staggered stance  - straddling noodle without UE support: cycling ( cues for avoiding impingment position with RUE)  Pt requires the buoyancy and hydrostatic pressure of water for support, and to offload joints by unweighting joint load by at least 50 % in navel deep water and by at least 75-80% in chest to neck deep water.  Viscosity of the water is needed for resistance of strengthening. Water current perturbations provides challenge to standing balance requiring increased core activation.     PATIENT EDUCATION:  Education details: intro to aquatic therapy  Person educated: Patient Education method: Solicitor, Education comprehension: verbalized understanding, returned demonstration, verbal cues required, HOME EXERCISE PROGRAM: Access Code: [email or web address] Last update 12/19/23  ASSESSMENT:  CLINICAL IMPRESSION:  Pt required minor cues to slow the speed of her exercise and instead focus on posture and quality of movement in pool.  She reported some increase in Rt shoulder pain with abdct/add if too deep or moving too fast.  Overall good  tolerance for session. She will benefit from PT services to help address her pain, strength, core stability, and overall level of function. Goals are ongoing.   Eval: Patient a 63 y.o. y.o. female who was seen today for physical therapy evaluation and treatment for R shoulder and bilateral feet pain. Patient presents with pain limited deficits in R shoulder and bilateral feet strength, ROM, endurance, activity tolerance, and functional mobility with ADL. Patient is having to modify and restrict ADL as indicated by outcome measure score as well as subjective information and objective measures which is affecting overall participation. Patient will benefit from skilled physical therapy in order to improve function and reduce impairment.  OBJECTIVE IMPAIRMENTS: Abnormal gait, decreased activity tolerance, decreased balance, decreased endurance, decreased mobility, difficulty walking, decreased ROM, decreased strength, hypomobility, increased muscle spasms, impaired flexibility, impaired UE functional use, improper body mechanics, postural dysfunction, and pain  ACTIVITY LIMITATIONS: carrying, lifting, bending, standing, squatting, stairs, transfers, bed mobility, reach over head, locomotion level, and caring for others  PARTICIPATION LIMITATIONS:  meal prep, cleaning, laundry, shopping, community activity, occupation, and yard work  PERSONAL FACTORS: Time since onset of injury/illness/exacerbation and 3+ comorbidities: Hx HTN, fibromyalgia, OA of multiple joints, CAD are also affecting patient's functional outcome.   REHAB POTENTIAL: Fair    CLINICAL DECISION MAKING: Evolving/moderate complexity  EVALUATION COMPLEXITY: Moderate   GOALS: Goals reviewed with patient? Yes  SHORT TERM GOALS: Target date: 01/16/2024      Patient will be independent with HEP in order to improve functional outcomes. Baseline:  Goal status: IN PROGRESS 11/26/23  2.  Patient will report at least 25% improvement in  symptoms for improved quality of life. Baseline:  Goal status: MET 11/26/23  LONG TERM GOALS: Target date: 02/13/2024    Patient will report at least 75% improvement in symptoms for improved quality of life. Baseline:  Goal status: IN PROGRESS 12/19/23  2.  Patient will improve quick dash score by at least 20% in order to indicate improved tolerance to activity. Baseline:  Goal status: IN PROGRESS 12/19/23  3.  Patient will demonstrate R shoulder AROM with pain less than 2/10 for improved mobility/lifting. Baseline:  Goal status: IN PROGRESS 12/19/23- per pt, can get to 9/10   4.   Patient will demonstrate grade of 4+/5 MMT grade in all UE tested musculature as evidence of improved strength to assist with lifting at home.  Baseline:  Goal status: IN PROGRESS 12/19/23  5.  Patient will demonstrate grade of 4+/5 MMT grade in all LE tested musculature as evidence of improved strength to assist with stair ambulation and gait.   Baseline:  Goal status: IN PROGRESS 12/19/23     PLAN:  PT FREQUENCY: 2x/week  PT DURATION: 8 weeks  PLANNED INTERVENTIONS: 97164- PT Re-evaluation, 97110-Therapeutic exercises, 97530- Therapeutic activity, 97112- Neuromuscular re-education, 97535- Self Care, 02859- Manual therapy, [phone]- Gait training, [phone]- Orthotic Fit/training, [phone]- Canalith repositioning, [id number]- Aquatic Therapy, [phone]- Splinting, [phone]- Wound care (first 20 sq cm), 97598- Wound care (each additional 20 sq cm)Patient/Family education, Balance training, Stair training, Taping, Dry Needling, Joint mobilization, Joint manipulation, Spinal manipulation, Spinal mobilization, Scar mobilization, and DME instructions.  PLAN FOR NEXT SESSION: transition to water PT, assess response.  Delon Aquas, PTA 01/06/24 5:57 PM [facility] [address] [place], [place], [address] Phone: [phone]   Fax:  [phone]

## 2024-01-07 ENCOUNTER — Telehealth: Payer: Self-pay | Admitting: Family Medicine

## 2024-01-07 ENCOUNTER — Telehealth: Payer: Self-pay

## 2024-01-07 NOTE — Telephone Encounter (Signed)
 Patient called to follow up on her lab work. She said that the Gamma Globulin was low and she would like to know how to proceed.

## 2024-01-07 NOTE — Telephone Encounter (Unsigned)
 Copied from CRM [id number]. Topic: Clinical - Medical Advice >> Jan 07, 2024 12:27 PM Anairis L wrote: Reason for CRM: Patient is requesting a call regarding why estradiol  (ESTRACE ) 0.5 MG tablet medroxyPROGESTERone  (PROVERA ) 2.5 MG tablet Has been refused.  As well as she would like to know what further steps should be taken for her low potassium seen on recent labs.   Please Advise.

## 2024-01-09 ENCOUNTER — Other Ambulatory Visit: Payer: Self-pay | Admitting: Family

## 2024-01-09 DIAGNOSIS — N951 Menopausal and female climacteric states: Secondary | ICD-10-CM

## 2024-01-09 MED ORDER — ESTRADIOL 0.5 MG PO TABS: 0.5000 mg | ORAL_TABLET | Freq: Every day | ORAL | 3 refills | Status: AC

## 2024-01-12 ENCOUNTER — Other Ambulatory Visit: Payer: Self-pay | Admitting: Family

## 2024-01-12 DIAGNOSIS — N951 Menopausal and female climacteric states: Secondary | ICD-10-CM

## 2024-01-12 MED ORDER — MEDROXYPROGESTERONE ACETATE 2.5 MG PO TABS: 2.5000 mg | ORAL_TABLET | Freq: Every day | ORAL | 0 refills | Status: AC

## 2024-01-15 ENCOUNTER — Encounter (HOSPITAL_BASED_OUTPATIENT_CLINIC_OR_DEPARTMENT_OTHER): Payer: Self-pay | Admitting: Physical Therapy

## 2024-01-15 ENCOUNTER — Encounter

## 2024-01-15 ENCOUNTER — Encounter: Admitting: *Deleted

## 2024-01-15 ENCOUNTER — Ambulatory Visit (HOSPITAL_BASED_OUTPATIENT_CLINIC_OR_DEPARTMENT_OTHER): Admitting: Physical Therapy

## 2024-01-15 VITALS — BP 135/89 | HR 63 | Temp 97.5°F | Resp 16 | Ht 64.0 in | Wt 162.0 lb

## 2024-01-15 DIAGNOSIS — M6281 Muscle weakness (generalized): Secondary | ICD-10-CM | POA: Diagnosis not present

## 2024-01-15 DIAGNOSIS — R29898 Other symptoms and signs involving the musculoskeletal system: Secondary | ICD-10-CM

## 2024-01-15 DIAGNOSIS — Z006 Encounter for examination for normal comparison and control in clinical research program: Secondary | ICD-10-CM

## 2024-01-15 DIAGNOSIS — R2689 Other abnormalities of gait and mobility: Secondary | ICD-10-CM

## 2024-01-15 NOTE — Therapy (Signed)
 OUTPATIENT PHYSICAL THERAPY  TREATMENT    Patient Name: Sonya [name] MRN: [id number] DOB:August 08, 1960, 63 y.o., female Today's Date: 01/15/2024  END OF SESSION:  PT End of Session - 01/15/24 1517     Visit Number 8    Number of Visits 16    Date for Recertification  02/13/24    Authorization Type [employer] NEXT    Authorization Time Period 12/22/23-02/13/24    Authorization - Visit Number 2    Authorization - Number of Visits 10    PT Start Time 1520    PT Stop Time 1600    PT Time Calculation (min) 40 min    Activity Tolerance Patient tolerated treatment well;Patient limited by pain    Behavior During Therapy [facility] for tasks assessed/performed              Past Medical History:  Diagnosis Date   Arthritis    Fibromyalgia    Hypertension    Past Surgical History:  Procedure Laterality Date   CESAREAN SECTION  1987   Patient Active Problem List   Diagnosis Date Noted   AC (acromioclavicular) arthritis 12/24/2023   Multiple joint pain 08/28/2023   Chronic right shoulder pain 08/28/2023   Primary osteoarthritis involving multiple joints 08/28/2023   Other fatigue 08/28/2023   Primary osteoarthritis of both hands 06/20/2019   Primary osteoarthritis of both knees 06/20/2019   Primary osteoarthritis of both feet 06/20/2019   Agatston coronary artery calcium  score greater than 400 04/20/2018   Familial hyperlipidemia 12/26/2017   Family history of heart disease 12/26/2017   Statin intolerance 12/26/2017   Preventative health care 09/28/2017   Arthritis 09/28/2017   Essential hypertension 05/14/2016   Generalized anxiety disorder 05/14/2016    PCP: Antonio Cyndee Jamee JONELLE, DO  REFERRING PROVIDER: Antonio Cyndee Jamee JONELLE, DO  REFERRING DIAG: M15.0 (ICD-10-CM) - Primary osteoarthritis involving multiple joints  Rationale for Evaluation and Treatment: Rehabilitation  THERAPY DIAG:  Muscle weakness (generalized)  Other abnormalities of gait and  mobility  Other symptoms and signs involving the musculoskeletal system  ONSET DATE: Chronic  SUBJECTIVE:                                                                                                                                                                                           SUBJECTIVE STATEMENT: Had sharp pain in my shoulder (pt pointing to mid deltiod insertion area) for 5 days after last aquatic session   Eval: Patient states R arm pain. She states both feet are painful. Pain with laying on shoulder and difficulty sleeping because of it. Patient states R shoulder acute on  chronic pain which was irritated in April. Pain with lifting arm.   PERTINENT HISTORY:  Hx HTN, fibromyalgia, OA of multiple joints, CAD  PAIN:  Are you having pain? Yes: NPRS scale: 5/10 Pain location: feet, and R shoulder (but more feet) Pain description: stabbing/aching, freezing, won't bend   Aggravating factors: movement, walking  Relieving factors: unclear   PRECAUTIONS: None  WEIGHT BEARING RESTRICTIONS: No  FALLS:  Has patient fallen in last 6 months? No  OCCUPATION: hair dresser  PLOF: Independent  PATIENT GOALS: be able to use arm, decrease pain; be able to walk on feet   OBJECTIVE: (objective measures from initial evaluation unless otherwise dated)  PATIENT SURVEYS:  Quick Dash:  QUICK DASH  Please rate your ability do the following activities in the last week by selecting the number below the appropriate response.   Activities Rating eval  12/19/23  Open a tight or new jar.  5 = Unable   Do heavy household chores (e.g., wash walls, floors). 4 = Severe difficulty   Carry a shopping bag or briefcase 3 = Moderate difficulty   Wash your back. 4 = Severe difficulty   Use a knife to cut food. 3 = Moderate difficulty   Recreational activities in which you take some force or impact through your arm, shoulder or hand (e.g., golf, hammering, tennis, etc.). 4 = Severe  difficulty   During the past week, to what extent has your arm, shoulder or hand problem interfered with your normal social activities with family, friends, neighbors or groups?  1 = Not at all   During the past week, were you limited in your work or other regular daily activities as a result of your arm, shoulder or hand problem? 4 = Very limited   Rate the severity of the following symptoms in the last week: Arm, Shoulder, or hand pain. 5 = Extreme   Rate the severity of the following symptoms in the last week: Tingling (pins and needles) in your arm, shoulder or hand. 1 = none   During the past week, how much difficulty have you had sleeping because of the pain in your arm, shoulder or hand?  5 = So much difficulty that I can't sleep     63.6% at eval  79.5%   (A QuickDASH score may not be calculated if there is greater than 1 missing item.)  Quick Dash Disability/Symptom Score:63.6%  Minimally Clinically Important Difference (MCID): 15-20 points  Flavio, F. et al. (2013). Minimally clinically important difference of the disabilities of the arm, shoulder, and hand outcome measures (DASH) and its shortened version (Quick DASH). Journal of Orthopaedic & Sports Physical Therapy, 44(1), 30-39)   COGNITION: Overall cognitive status: Within functional limits for tasks assessed     SENSATION: WFL for UE   POSTURE: rounded shoulders and forward head  PALPATION: Hypermobile AP/PA bilateral R>L , grossly TTP R shoulder  UPPER EXTREMITY ROM:   Active ROM Right 10/25/2023 Left 10/25/2023  Shoulder flexion WFL * with compensation   Shoulder extension    Shoulder abduction    Shoulder adduction    Shoulder internal rotation functional  T8* T5  Shoulder external rotation T4* T4  Elbow flexion    Elbow extension    Wrist flexion    Wrist extension    Wrist ulnar deviation    Wrist radial deviation    Wrist pronation    Wrist supination    (Blank rows = not tested) *=  pain/symptoms  UPPER EXTREMITY  MMT:  MMT Right 10/25/2023 Left 10/25/2023 11/26/23 R 11/26/23 L Right 12/19/23 Left 12/19/23  Shoulder flexion 3+* 4- 3+ 4 3+ 4  Shoulder extension        Shoulder abduction   3+ 4 3+ 4  Shoulder adduction 3+* 4-      Shoulder internal rotation 4+* 5 5 5 4 5   Shoulder external rotation 3* 4+ 3- 4+ 4- 5  Middle trapezius        Lower trapezius        Elbow flexion 4+ 5   4 5   Elbow extension 4* 5   4 5   Wrist flexion        Wrist extension        Wrist ulnar deviation        Wrist radial deviation        Wrist pronation        Wrist supination        Grip strength (lbs)        (Blank rows = not tested) *= pain/symptoms   LOWER EXTREMITY ROM:   Test in upcoming session  Active  Right 11/12/23 Left 11/12/23  Hip flexion    Hip extension    Hip abduction    Hip adduction    Hip internal rotation    Hip external rotation    Knee flexion 129 132  Knee extension WNL WNL  Ankle dorsiflexion 1 1  Ankle plantarflexion 61 61  Ankle inversion 18 20  Ankle eversion 8 10   (Blank rows = not tested) * = pain/symptoms  LOWER EXTREMITY MMT:  Test in upcoming session  MMT Right 10/8 Left 10/8 Right 12/19/23 Left 12/19/23  Hip flexion   3 3+  Hip extension   3- 3-  Hip abduction 4- 4- 3 4  Hip adduction      Hip internal rotation      Hip external rotation      Knee flexion 4+/5 4+/5 4 4   Knee extension 4/5 3+/5! 4 4  Ankle dorsiflexion 4+/5! 5/5!    Ankle plantarflexion 5/5! 4+/5!    Ankle inversion 5/5! 4+/5!    Ankle eversion 5/5! 4/5!     (Blank rows = not tested) * = pain/symptoms    GAIT: Distance walked: 100 feet Assistive device utilized: None Level of assistance: Complete Independence Comments:   TODAY'S TREATMENT:                                                                                                                              [facility] Adult PT Treatment:                                             Date:  01/15/24 Pt seen for aquatic therapy today.  Treatment took place in water 3.5-4.75 ft  in depth at the [facility] pool. Temp of water was 91.  Pt entered/exited the pool via stairs independently with bil rail.  - unsupported walking forward/ backward, multiple laps, cues for vertical trunk and reciprocal arm swing - unsupported side stepping -> arm add/abdct with RBHB -> horz abdct/add with/without short hollow noodles - [place] - UE on yellow hand floats:  hip add/abd x10  - straddling noodle without UE support: cycling no arm movement to avoid impingement  -in above position ue support right only wall: hip add/abd and flex/ext - standing on nekdoodle: toe splaying; inversion/eversion; toe raises; heel raises - UE on yellow HB: hip flexion /extension x10 -standing triceps press, shoulder add/abd x 10 using 1/4 hollow noodles   Pt requires the buoyancy and hydrostatic pressure of water for support, and to offload joints by unweighting joint load by at least 50 % in navel deep water and by at least 75-80% in chest to neck deep water.  Viscosity of the water is needed for resistance of strengthening. Water current perturbations provides challenge to standing balance requiring increased core activation.     PATIENT EDUCATION:  Education details: intro to aquatic therapy  Person educated: Patient Education method: Solicitor, Education comprehension: verbalized understanding, returned demonstration, verbal cues required, HOME EXERCISE PROGRAM: Access Code: [email or web address] Last update 12/19/23  ASSESSMENT:  CLINICAL IMPRESSION: Pt with improved pacing of exercises today. Reports bilateral toe numbness about 15 mins into session, vertical suspension on noodle reduces slightly. She is able to tolerate intrinsic foot muscle engagement using nekdoodle on bottom of pool for cushion. Improved right shoulder toleration to exercise using 1/4 noodle. Goals ongoing     Eval: Patient a 63 y.o. y.o. female who was seen today for physical therapy evaluation and treatment for R shoulder and bilateral feet pain. Patient presents with pain limited deficits in R shoulder and bilateral feet strength, ROM, endurance, activity tolerance, and functional mobility with ADL. Patient is having to modify and restrict ADL as indicated by outcome measure score as well as subjective information and objective measures which is affecting overall participation. Patient will benefit from skilled physical therapy in order to improve function and reduce impairment.  OBJECTIVE IMPAIRMENTS: Abnormal gait, decreased activity tolerance, decreased balance, decreased endurance, decreased mobility, difficulty walking, decreased ROM, decreased strength, hypomobility, increased muscle spasms, impaired flexibility, impaired UE functional use, improper body mechanics, postural dysfunction, and pain  ACTIVITY LIMITATIONS: carrying, lifting, bending, standing, squatting, stairs, transfers, bed mobility, reach over head, locomotion level, and caring for others  PARTICIPATION LIMITATIONS:  meal prep, cleaning, laundry, shopping, community activity, occupation, and yard work  PERSONAL FACTORS: Time since onset of injury/illness/exacerbation and 3+ comorbidities: Hx HTN, fibromyalgia, OA of multiple joints, CAD are also affecting patient's functional outcome.   REHAB POTENTIAL: Fair    CLINICAL DECISION MAKING: Evolving/moderate complexity  EVALUATION COMPLEXITY: Moderate   GOALS: Goals reviewed with patient? Yes  SHORT TERM GOALS: Target date: 01/16/2024      Patient will be independent with HEP in order to improve functional outcomes. Baseline:  Goal status: IN PROGRESS 11/26/23  2.  Patient will report at least 25% improvement in symptoms for improved quality of life. Baseline:  Goal status: MET 11/26/23    LONG TERM GOALS: Target date: 02/13/2024    Patient will report at  least 75% improvement in symptoms for improved quality of life. Baseline:  Goal status: IN PROGRESS 12/19/23  2.  Patient will improve quick dash score by at least  20% in order to indicate improved tolerance to activity. Baseline:  Goal status: IN PROGRESS 12/19/23  3.  Patient will demonstrate R shoulder AROM with pain less than 2/10 for improved mobility/lifting. Baseline:  Goal status: IN PROGRESS 12/19/23- per pt, can get to 9/10   4.   Patient will demonstrate grade of 4+/5 MMT grade in all UE tested musculature as evidence of improved strength to assist with lifting at home.  Baseline:  Goal status: IN PROGRESS 12/19/23  5.  Patient will demonstrate grade of 4+/5 MMT grade in all LE tested musculature as evidence of improved strength to assist with stair ambulation and gait.   Baseline:  Goal status: IN PROGRESS 12/19/23     PLAN:  PT FREQUENCY: 2x/week  PT DURATION: 8 weeks  PLANNED INTERVENTIONS: 97164- PT Re-evaluation, 97110-Therapeutic exercises, 97530- Therapeutic activity, 97112- Neuromuscular re-education, 97535- Self Care, 02859- Manual therapy, [phone]- Gait training, [phone]- Orthotic Fit/training, [phone]- Canalith repositioning, [id number]- Aquatic Therapy, [phone]- Splinting, [phone]- Wound care (first 20 sq cm), 97598- Wound care (each additional 20 sq cm)Patient/Family education, Balance training, Stair training, Taping, Dry Needling, Joint mobilization, Joint manipulation, Spinal manipulation, Spinal mobilization, Scar mobilization, and DME instructions.  PLAN FOR NEXT SESSION: transition to water PT, assess response.  [place]) Elis Sauber MPT 01/15/2024 3:25 PM [facility] [address] [place], [place], [address] Phone: [phone]   Fax:  [phone]

## 2024-01-15 NOTE — Research (Signed)
 Subject Name: Sonya [name]  Subject met inclusion and exclusion criteria.  The informed consent form, study requirements and expectations were reviewed with the subject and questions and concerns were addressed prior to the signing of the consent form.  The subject verbalized understanding of the trial requirements.  The subject agreed to participate in the pre-event   trial and signed the informed consent at 1205 on 15-Jan-2024.  The informed consent was obtained prior to performance of any protocol-specific procedures for the subject.  A copy of the signed informed consent was given to the subject and a copy was placed in the subject's medical record.   Sonya [name]   Pre-Event Main Screening  Date of Visit: 26-Jan-2024           Subject Number:[id number]  During this visit the following activities were completed:  [x]  Reading, Signing and Understanding the informed Consent   [x]  Medical History                                          [x]  Smoking History (Prior, or concurrent use) n/a  [x]  Vital signs     Arm:   right                           [x]  Adverse events      BP: 135/89      HR: 63                                                       [x]  Serious adverse device effects      Resp: 16                                                                                     Temp: 36.4                                                 [x]  Concomitant therapies reviewed      O2 Sat:98%      Height: 5 ft 4 in                                                    Weight:162.0 lbs      Waist circumference:36 in   [x]  Central Labs        []  Urine Preg test        []  Serum Preg test        []  FSH (only if needed to determine childbearing potential)         [x]   Fasting glucose         [x]  HbA1c        [x]  Fasting lipid panel & apolipoproteins        [x]  Coag        [x]  Chemistry        [x]  Hematology        [x]  Hs-CRP  Sonya [name] is here for main screening visit for  pre-event. She reports no visits to the Ed or urgent care, no changes in her meds, and no abd pain, or other pain. VS taken at 1220. Blood drawn at 1224. Informed her I will call her once I get Lab results back. Voices understanding. Sonya [name] was placed in a quiet room, given time to read over the consent and ask questions.      Current Outpatient Medications:    ALPRAZolam  (XANAX ) 0.5 MG tablet, Take 1 tablet (0.5 mg total) by mouth 3 (three) times daily as needed., Disp: 30 tablet, Rfl: 1   amLODipine  (NORVASC ) 5 MG tablet, Take 1 tablet (5 mg total) by mouth daily., Disp: 90 tablet, Rfl: 3   aspirin EC 81 MG tablet, Take 81 mg by mouth daily. Swallow whole., Disp: , Rfl:    azelastine  (ASTELIN ) 0.1 % nasal spray, Place 2 sprays into both nostrils 2 (two) times daily. Use in each nostril as directed, Disp: 30 mL, Rfl: 3   Biotin 1000 MCG tablet, Take 1,000 mcg by mouth daily., Disp: , Rfl:    CALCIUM  CITRATE-VITAMIN D3 PO, Take 1 capsule by mouth daily., Disp: , Rfl:    Cholecalciferol (VITAMIN D3) 25 MCG (1000 UT) CAPS, Take 1 capsule by mouth daily., Disp: , Rfl:    [facility] OMEGA-3 KRILL OIL 500 MG CAPS, Take 1 capsule by mouth daily., Disp: , Rfl:    estradiol  (ESTRACE ) 0.5 MG tablet, Take 1 tablet (0.5 mg total) by mouth daily., Disp: 90 tablet, Rfl: 3   Evolocumab  (REPATHA  SURECLICK) 140 MG/ML SOAJ, INJECT 140 MG INTO THE SKIN EVERY 14 (FOURTEEN) DAYS., Disp: 6 mL, Rfl: 3   Evolocumab  (REPATHA  SURECLICK) 140 MG/ML SOAJ, Inject 140 mg into the skin every 14 (fourteen) days., Disp: 1 mL, Rfl: 0   ezetimibe  (ZETIA ) 10 MG tablet, Take 10 mg by mouth daily., Disp: , Rfl:    hydrochlorothiazide  (HYDRODIURIL ) 25 MG tablet, Take 1 tablet (25 mg total) by mouth daily., Disp: 90 tablet, Rfl: 1   HYDROcodone -acetaminophen  (NORCO/VICODIN) 5-325 MG tablet, Take 1 tablet by mouth every 6 (six) hours as needed for moderate pain (pain score 4-6)., Disp: 45 tablet, Rfl: 0   LORazepam  (ATIVAN ) 0.5 MG tablet,  Take 1 tablet (0.5 mg total) by mouth every 6 (six) hours as needed for anxiety., Disp: 90 tablet, Rfl: 1   MAGNESIUM OXIDE PO, Take 200 mg by mouth daily., Disp: , Rfl:    meclizine  (ANTIVERT ) 25 MG tablet, Take 1 tablet (25 mg total) by mouth 3 (three) times daily as needed for dizziness., Disp: 30 tablet, Rfl: 0   medroxyPROGESTERone  (PROVERA ) 2.5 MG tablet, Take 1 tablet (2.5 mg total) by mouth daily., Disp: 90 tablet, Rfl: 0   methocarbamol  (ROBAXIN ) 500 MG tablet, TAKE 1 TABLET BY MOUTH FOUR TIMES A DAY, Disp: 30 tablet, Rfl: 1   Phosphatidylserine 100 MG CAPS, Take 1 capsule by mouth daily., Disp: , Rfl:    pregabalin  (LYRICA ) 25 MG capsule, Take 1 capsule (25 mg total) by mouth 2 (two) times daily., Disp: 60 capsule, Rfl: 2  scopolamine  (TRANSDERM-SCOP) 1 MG/3DAYS, Place 1 patch (1.5 mg total) onto the skin every 3 (three) days., Disp: 10 patch, Rfl: 12   Turmeric Curcumin 500 MG CAPS, Take 1 capsule by mouth daily., Disp: , Rfl:    Ubiquinol 100 MG CAPS, Take 1 mg by mouth daily., Disp: , Rfl:    atomoxetine  (STRATTERA ) 25 MG capsule, Take 1 capsule (25 mg total) by mouth daily. (Patient not taking: Reported on 01/15/2024), Disp: 90 capsule, Rfl: 1   guanFACINE  (TENEX ) 1 MG tablet, TAKE 1 TABLET BY MOUTH EVERYDAY AT BEDTIME (Patient not taking: Reported on 01/15/2024), Disp: 90 tablet, Rfl: 1   lidocaine  (XYLOCAINE ) 2 % solution, Use as directed 15 mLs in the mouth or throat every 6 (six) hours as needed for mouth pain. Gargle and spit (Patient not taking: Reported on 01/15/2024), Disp: 100 mL, Rfl: 0

## 2024-01-20 ENCOUNTER — Other Ambulatory Visit: Payer: Self-pay | Admitting: Family Medicine

## 2024-01-20 DIAGNOSIS — D729 Disorder of white blood cells, unspecified: Secondary | ICD-10-CM

## 2024-01-27 ENCOUNTER — Encounter: Payer: Self-pay | Admitting: *Deleted

## 2024-01-27 DIAGNOSIS — Z006 Encounter for examination for normal comparison and control in clinical research program: Secondary | ICD-10-CM

## 2024-01-27 NOTE — Research (Signed)
 Spoke with Sonya [name]. Scheduled her for Jan 8th at 1200 for Day 1 of pre-event research study.

## 2024-02-04 ENCOUNTER — Ambulatory Visit: Admitting: Family Medicine

## 2024-02-06 ENCOUNTER — Ambulatory Visit (HOSPITAL_BASED_OUTPATIENT_CLINIC_OR_DEPARTMENT_OTHER): Admitting: Cardiology

## 2024-02-06 ENCOUNTER — Other Ambulatory Visit: Payer: Self-pay | Admitting: Cardiology

## 2024-02-06 DIAGNOSIS — E7849 Other hyperlipidemia: Secondary | ICD-10-CM

## 2024-02-06 DIAGNOSIS — R931 Abnormal findings on diagnostic imaging of heart and coronary circulation: Secondary | ICD-10-CM

## 2024-02-06 DIAGNOSIS — Z8249 Family history of ischemic heart disease and other diseases of the circulatory system: Secondary | ICD-10-CM

## 2024-02-09 ENCOUNTER — Telehealth: Payer: Self-pay | Admitting: Pharmacy Technician

## 2024-02-09 ENCOUNTER — Other Ambulatory Visit (HOSPITAL_COMMUNITY): Payer: Self-pay

## 2024-02-09 NOTE — Telephone Encounter (Signed)
 Pharmacy Patient Advocate Encounter   Received notification from RX Request Messages that prior authorization for Repatha  is required/requested.   Insurance verification completed.   The patient is insured through [employer].   Per test claim: PA required; PA submitted to above mentioned insurance via Latent Key/confirmation #/EOC [id number] Status is pending

## 2024-02-09 NOTE — Telephone Encounter (Addendum)
" ° °  This gives this reject:  [id number]  RxPCN [id number] ID: [id number] GROUP: [id number]    None of these insurances work on database administrator. I called the patient and lmom. She has been on repatha . She has an allergy to praluent . I called [facility] and they said the [facility] id is what they are using and it tells them the repatha  needs a prior authorization. It will not allow test bill claim. 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

## 2024-02-09 NOTE — Telephone Encounter (Signed)
 Pharmacy Patient Advocate Encounter  Received notification from [employer] that Prior Authorization for repatha  has been APPROVED from 02/09/24 to 08/07/24. Spoke to pharmacy to process.Copay is $150.00 for 90 days and 25.00 for 30 days.    PA #/Case ID/Reference #: [id number]      I got this coupon but Coupon expired [facility]. This was the same coupon that [facility] already had. I called the patient and made her aware. She is going to call the coupon place and insurance. She is also going to check into [facility] being billed medical

## 2024-02-12 ENCOUNTER — Other Ambulatory Visit: Payer: Self-pay | Admitting: *Deleted

## 2024-02-12 ENCOUNTER — Encounter: Admitting: *Deleted

## 2024-02-12 VITALS — BP 125/83 | HR 62 | Temp 98.2°F | Resp 16

## 2024-02-12 DIAGNOSIS — Z006 Encounter for examination for normal comparison and control in clinical research program: Secondary | ICD-10-CM

## 2024-02-12 MED ORDER — STUDY - OCEAN(A)-PRE-EVENT - OLPASIRAN 142 MG/ML OR PLACEBO SQ INJECTION (PI-HILTY)
142.0000 mg | PREFILLED_SYRINGE | SUBCUTANEOUS | Status: AC
  Administered 2024-02-12: 142 mg via SUBCUTANEOUS
  Filled 2024-02-12: qty 1

## 2024-02-12 NOTE — Research (Addendum)
 Sonya [name]                Hematology: WBC 3.77                     [] Clinically Significant  [x] Not Clinically Significant    Lipids:  HDLC4 71 mg/dL          [] Clinically Significant  [x] Not Clinically Significant   Any further action needed to be taken per the PI? No  Vinie KYM Maxcy, MD, [facility], FNLA, FACP  [facility]  Medical Director of the [facility]  [facility] Diplomate of the American Board of Clinical Lipidology Attending Cardiologist  Direct Dial: [phone]  Fax: [phone]  Website:  [email or web address]                           Pre-Event Day 1 Visit  Date of Visit:12-Feb-2024          Subject Number:[id number]  During this visit the following activities were completed:  [x]   Enrollment/randomization  rand # [id number]      [x]  Review inclusion and exclusion  [x]  Medical History      [x]  Vital signs        Arm: Right arm        BP:125/83       HR:62       Resp:16       Temp: 36.8       O2 Sat:100%  [x]  Physical Exam  [x]  EKG  [x]  Adverse events  [x]  Serious adverse device effects  [x] Vital Status/ non-fatal PEPs  [x]  Concomitant therapies reviewed  [x]  Central Labs              [x]  Fasting glucose       [x]  HbA1c       [x] Fasting lipid panel & apolipoproteins       [x]  Lp(a)        [x] Coag       [x]  Chemistry       [x]  UACR       [x]  Hematology       [x]  Anti-olpasiran antibody   [x] Biomarker assessment    [x]  Biomarker development sample  [x] Pharmacokinetic assessments     [x]  Olpasiran serum PK (obtain 1-72 hours after IP given)  [x] Genetic assessment      [x]  Genetic research (optional)   [x]  Olpasiran or placebo        [x]  Monitor at least 30 mins (1 hour)    Ms Eskridge is here for Day 1 of pre-event. Dr Morris did exam. VS taken at 1230. Blood drawn 1244. Injection given at 1320. Right abd Tol well Kit number BX  [id number].Scheduled next appointment for Feb 3 at 1200. Post blood drawn at 1420.  Current Medications[1]      [1]  Current Outpatient Medications:    ALPRAZolam  (XANAX ) 0.5 MG tablet, Take 1 tablet (0.5 mg total) by mouth 3 (three) times daily as needed., Disp: 30 tablet, Rfl: 1   amLODipine  (NORVASC ) 5 MG tablet, Take 1 tablet (5 mg total) by mouth daily., Disp: 90 tablet, Rfl: 3   aspirin EC 81 MG tablet, Take 81 mg by mouth daily. Swallow whole., Disp: , Rfl:    azelastine  (ASTELIN ) 0.1 % nasal spray, Place 2 sprays into both nostrils 2 (two) times daily. Use in each nostril as directed, Disp: 30 mL, Rfl: 3   Biotin 1000  MCG tablet, Take 1,000 mcg by mouth daily., Disp: , Rfl:    CALCIUM  CITRATE-VITAMIN D3 PO, Take 1 capsule by mouth daily., Disp: , Rfl:    Cholecalciferol (VITAMIN D3) 25 MCG (1000 UT) CAPS, Take 1 capsule by mouth daily., Disp: , Rfl:    [employer] OMEGA-3 KRILL OIL 500 MG CAPS, Take 1 capsule by mouth daily., Disp: , Rfl:    estradiol  (ESTRACE ) 0.5 MG tablet, Take 1 tablet (0.5 mg total) by mouth daily., Disp: 90 tablet, Rfl: 3   Evolocumab  (REPATHA  SURECLICK) 140 MG/ML SOAJ, INJECT 140 MG INTO THE SKIN EVERY 14 (FOURTEEN) DAYS., Disp: 6 mL, Rfl: 3   Evolocumab  (REPATHA  SURECLICK) 140 MG/ML SOAJ, Inject 140 mg into the skin every 14 (fourteen) days., Disp: 1 mL, Rfl: 0   ezetimibe  (ZETIA ) 10 MG tablet, Take 10 mg by mouth daily., Disp: , Rfl:    hydrochlorothiazide  (HYDRODIURIL ) 25 MG tablet, Take 1 tablet (25 mg total) by mouth daily., Disp: 90 tablet, Rfl: 1   HYDROcodone -acetaminophen  (NORCO/VICODIN) 5-325 MG tablet, Take 1 tablet by mouth every 6 (six) hours as needed for moderate pain (pain score 4-6)., Disp: 45 tablet, Rfl: 0   LORazepam  (ATIVAN ) 0.5 MG tablet, Take 1 tablet (0.5 mg total) by mouth every 6 (six) hours as needed for anxiety., Disp: 90 tablet, Rfl: 1   MAGNESIUM OXIDE PO, Take 200 mg by mouth daily., Disp: , Rfl:    meclizine  (ANTIVERT ) 25 MG tablet, Take 1  tablet (25 mg total) by mouth 3 (three) times daily as needed for dizziness., Disp: 30 tablet, Rfl: 0   medroxyPROGESTERone  (PROVERA ) 2.5 MG tablet, Take 1 tablet (2.5 mg total) by mouth daily., Disp: 90 tablet, Rfl: 0   methocarbamol  (ROBAXIN ) 500 MG tablet, TAKE 1 TABLET BY MOUTH FOUR TIMES A DAY, Disp: 30 tablet, Rfl: 1   Phosphatidylserine 100 MG CAPS, Take 1 capsule by mouth daily., Disp: , Rfl:    pregabalin  (LYRICA ) 25 MG capsule, Take 1 capsule (25 mg total) by mouth 2 (two) times daily., Disp: 60 capsule, Rfl: 2   scopolamine  (TRANSDERM-SCOP) 1 MG/3DAYS, Place 1 patch (1.5 mg total) onto the skin every 3 (three) days., Disp: 10 patch, Rfl: 12   Turmeric Curcumin 500 MG CAPS, Take 1 capsule by mouth daily., Disp: , Rfl:    Ubiquinol 100 MG CAPS, Take 1 mg by mouth daily., Disp: , Rfl:    atomoxetine  (STRATTERA ) 25 MG capsule, Take 1 capsule (25 mg total) by mouth daily. (Patient not taking: Reported on 01/15/2024), Disp: 90 capsule, Rfl: 1   guanFACINE  (TENEX ) 1 MG tablet, TAKE 1 TABLET BY MOUTH EVERYDAY AT BEDTIME (Patient not taking: Reported on 01/15/2024), Disp: 90 tablet, Rfl: 1   lidocaine  (XYLOCAINE ) 2 % solution, Use as directed 15 mLs in the mouth or throat every 6 (six) hours as needed for mouth pain. Gargle and spit (Patient not taking: Reported on 02/12/2024), Disp: 100 mL, Rfl: 0  Current Facility-Administered Medications:    Study - OCEAN(A)-PRE-EVENT - olpasiran 142 mg/mL or placebo SQ injection (PI-Hilty), 142 mg, Subcutaneous, Q90 days,

## 2024-02-13 ENCOUNTER — Ambulatory Visit: Admitting: Family Medicine

## 2024-02-13 ENCOUNTER — Encounter: Payer: Self-pay | Admitting: *Deleted

## 2024-02-13 NOTE — Progress Notes (Signed)
 This very nice lady is in for randomization in pre-event.  She has had no prior CV event but does have an elevated calcium  score.  She is a retired interior and spatial designer.  She denies significant cardiac symptoms.  She sees Dr. Shelda Bruckner.  Qualifying Lpa 462.   The purpose of the study was reviewed in detail today.  PMH  Osteoarthritis           Elevated Calcium  score 2019  [address] 99th for age           Hyperlipidemia (LDL 95 on noted meds)  Meds  see list. Includes Zetia , Repatha ,           1.           02/12/2024   12:30 PM  BP 125/83  BP Location Right Arm  Patient Position Sitting  Cuff Size Normal  Pulse Rate 62  Temp 98.2 F (36.8 C)  Temp Source Temporal  Resp 16  SpO2 100 %  Pain Score 0-No pain  Alert, oriented very pleasant female in NAD Lungs clear Cor regular without murmur Abd soft  Ext - no edema Neuro non focal  ECG -  SB.  Otherwise WNL. No change from tracing of Oct 2022.   Impression  Elevated LPa without prior event Hyperlipidemia on multidrug treatment  Plan  Randomized in PRE-EVENT.  Nature of Randomization, data regarding drug and other potential alternatives, impact of Repatha , what we know about Olpasiran all discussed.  She has consented to proceed.   Debby BIRCH. Morris, MD, [facility] Medical program chair, [facility]

## 2024-02-16 ENCOUNTER — Ambulatory Visit: Admitting: Family Medicine

## 2024-02-20 ENCOUNTER — Ambulatory Visit: Payer: Self-pay

## 2024-02-20 NOTE — Telephone Encounter (Signed)
 Reason for Triage: patient has dizziness and diarrhea. Nausea and back ache also      Reason for Disposition  [1] MILD dizziness (e.g., walking normally) AND [2] has NOT been evaluated by doctor (or NP/PA) for this  (Exception: Dizziness caused by heat exposure, sudden standing, or poor fluid intake.)  Abdominal pain  (Exceptions: Pain clears with each passage of diarrhea stool, or symptoms similar to previously diagnosed irritable bowel syndrome.)  Answer Assessment - Initial Assessment Questions Pt had headache with floaters last week that has since resolved. States slight dizziness now. Denies any higher acuity symptoms.      1. DESCRIPTION: Describe your dizziness.     Slight dizziness 2. LIGHTHEADED: Do you feel lightheaded? (e.g., somewhat faint, woozy, weak upon standing)     no 3. VERTIGO: Do you feel like either you or the room is spinning or tilting? (i.e., vertigo)     no 4. SEVERITY: How bad is it?  Do you feel like you are going to faint? Can you stand and walk?     Very mild 5. ONSET:  When did the dizziness begin?     This week 6. AGGRAVATING FACTORS: Does anything make it worse? (e.g., standing, change in head position)      7. HEART RATE: Can you tell me your heart rate? How many beats in 15 seconds?  (Note: Not all patients can do this.)        8. CAUSE: What do you think is causing the dizziness? (e.g., decreased fluids or food, diarrhea, emotional distress, heat exposure, new medicine, sudden standing, vomiting; unknown)     Unknown, drinking plenty of fluids 9. RECURRENT SYMPTOM: Have you had dizziness before? If Yes, ask: When was the last time? What happened that time?      10. OTHER SYMPTOMS: Do you have any other symptoms? (e.g., fever, chest pain, vomiting, diarrhea, bleeding)       Nausea, diarrhea, abdominal pain  Answer Assessment - Initial Assessment Questions Gi issues the last 6 days. Diarrhea about 2-3 times a day  now, was more earlier this week. Pt has been on 3 atbx course in the last 2 months for dental issues. She is currently on atbx now. Abdominal pain that started this am- intermittent 7/10, above pelvis and in her back, both side. She states she did take a sudafed but has no URI symptoms  She is also on adhd medications which she feels like are not working.  Not appts available today, pt did not want to schedule for Monday incase she needed to cancel and it count against her. RN did recommend UC. Pt states she does not want to do that. She would like to be put on a wait list to see someone today, if anything opens up but did not want to schedule an appt for Monday.     1. DIARRHEA SEVERITY: How bad is the diarrhea? How many more stools have you had in the past 24 hours than normal?      2-3 times a day now 2. ONSET: When did the diarrhea begin?      6 days 3. STOOL DESCRIPTION:  How loose or watery is the diarrhea? What is the stool color? Is there any blood or mucous in the stool?      4. VOMITING: Are you also vomiting? If Yes, ask: How many times in the past 24 hours?      denies 5. ABDOMEN PAIN: Are you having any abdomen pain?  If Yes, ask: What does it feel like? (e.g., crampy, dull, intermittent, constant)      Yes intermittent this am 6. ABDOMEN PAIN SEVERITY: If present, ask: How bad is the pain?  (e.g., Scale 1-10; mild, moderate, or severe)     7 7. ORAL INTAKE: If vomiting, Have you been able to drink liquids? How much liquids have you had in the past 24 hours?     Has been drinking plenty of fluids, eating yogurt, kombucha 8. HYDRATION: Any signs of dehydration? (e.g., dry mouth [not just dry lips], too weak to stand, dizziness, new weight loss) When did you last urinate?    Dizziness but states she is drinking plenty of fluids 9. EXPOSURE: Have you traveled to a foreign country recently? Have you been exposed to anyone with diarrhea? Could you have  eaten any food that was spoiled?     atbx 10. ANTIBIOTIC USE: Are you taking antibiotics now or have you taken antibiotics in the past 2 months?       Yes 3 rounds in last 2 months, currently on one 11. OTHER SYMPTOMS: Do you have any other symptoms? (e.g., fever, blood in stool)       Nausea, abdominal pain, back pain, denies any urinary symptoms.  Protocols used: Dizziness - Lightheadedness-A-AH, Diarrhea-A-AH

## 2024-02-20 NOTE — Telephone Encounter (Signed)
 FYI Only or Action Required?: Action required by provider: request for appointment. For today  Patient was last seen in primary care on 11/21/2023 by Antonio Meth, Jamee SAUNDERS, DO.  Called Nurse Triage reporting Diarrhea and Dizziness.  Symptoms began several days ago.  Interventions attempted: Rest, hydration, or home remedies.  Symptoms are: unchanged.  Triage Disposition: See Physician Within 24 Hours, See PCP When Office is Open (Within 3 Days)  Patient/caregiver understands and will follow disposition?: No, wishes to speak with PCP

## 2024-02-20 NOTE — Telephone Encounter (Signed)
 Spoke with patient and advised UC due to no appts or wait list available. Pt states not wanting to go to UC because everything that is going around---I advised patient that we see sick patients in our clinic and Dr. Antonio advises she go to [facility]. Pt states she likely won't go.

## 2024-02-20 NOTE — Telephone Encounter (Signed)
 Lvm to sched

## 2024-02-23 ENCOUNTER — Ambulatory Visit: Payer: Self-pay

## 2024-02-23 NOTE — Telephone Encounter (Signed)
 Got her scheduled

## 2024-02-23 NOTE — Telephone Encounter (Signed)
 FYI Only or Action Required?: Action required by provider: request for appointment.  Patient was last seen in primary care on 11/21/2023 by Antonio Meth, Jamee SAUNDERS, DO.  Called Nurse Triage reporting Diarrhea.  Symptoms began 10 days ago.  Interventions attempted: Rest, hydration, or home remedies.  Symptoms are: stable.  Triage Disposition: See PCP When Office is Open (Within 3 Days)  Patient/caregiver understands and will follow disposition?: Yes    Copied from CRM [id number]. Topic: Clinical - Red Word Triage >> Feb 20, 2024 10:07 AM Deleta RAMAN wrote: Red Word that prompted transfer to Nurse Triage: Patient has nausea and back ache. Also dizziness and diarrhea  Please warm transfer Red Word call to Nurse Triage and then click Send Message and Close CRM. >> Feb 23, 2024 10:41 AM Suzen RAMAN wrote: Patient back on the line requesting an appt  >> Feb 20, 2024  3:45 PM CMA Lila C wrote: Pt already informed she needed to go to [facility].  >> Feb 20, 2024  3:32 PM China J wrote: Patient returning a call from [place] in regards to an appointment with Dr. Antonio. I was able to call CAL and verify with the patient that the time Jannis was trying to put her in for is no longer available. Please call patient if anything soon opens up. >> Feb 20, 2024 10:08 AM Deleta RAMAN wrote: Reason for Triage: patient has dizziness and diarrhea. Nausea and back ache also Reason for Disposition  [1] MILD diarrhea (e.g., 1-3 or more stools than normal in past 24 hours) AND [2] present >  7 days  (Exception: Chronic diarrhea that is not worse.)  Answer Assessment - Initial Assessment Questions Requesting appt with Dr. Antonio sooner than Thursday next available.     1. DIARRHEA SEVERITY: How bad is the diarrhea? How many more stools have you had in the past 24 hours than normal?      3 episodes 2. ONSET: When did the diarrhea begin?      10 days ago 3. STOOL DESCRIPTION:  How loose or watery is the  diarrhea? What is the stool color? Is there any blood or mucous in the stool?     no 4. VOMITING: Are you also vomiting? If Yes, ask: How many times in the past 24 hours?      no 5. ABDOMEN PAIN: Are you having any abdomen pain? If Yes, ask: What does it feel like? (e.g., crampy, dull, intermittent, constant)      no 6. ABDOMEN PAIN SEVERITY: If present, ask: How bad is the pain?  (e.g., Scale 1-10; mild, moderate, or severe)     no 7. ORAL INTAKE: If vomiting, Have you been able to drink liquids? How much liquids have you had in the past 24 hours?     Taking liquids well 8. HYDRATION: Any signs of dehydration? (e.g., dry mouth [not just dry lips], too weak to stand, dizziness, new weight loss) When did you last urinate?     Urinating well 9. EXPOSURE: Have you traveled to a foreign country recently? Have you been exposed to anyone with diarrhea? Could you have eaten any food that was spoiled?     no 10. ANTIBIOTIC USE: Are you taking antibiotics now or have you taken antibiotics in the past 2 months?       Taken antibiotics 3 rounds in last 2 months 11. OTHER SYMPTOMS: Do you have any other symptoms? (e.g., fever, blood in stool)  Denies blood  Protocols used: Diarrhea-A-AH

## 2024-02-24 ENCOUNTER — Ambulatory Visit (INDEPENDENT_AMBULATORY_CARE_PROVIDER_SITE_OTHER): Admitting: Family Medicine

## 2024-02-24 ENCOUNTER — Encounter: Payer: Self-pay | Admitting: Family Medicine

## 2024-02-24 VITALS — BP 100/78 | HR 75 | Temp 97.6°F | Resp 18 | Ht 64.0 in | Wt 159.0 lb

## 2024-02-24 DIAGNOSIS — R197 Diarrhea, unspecified: Secondary | ICD-10-CM | POA: Insufficient documentation

## 2024-02-24 DIAGNOSIS — G4700 Insomnia, unspecified: Secondary | ICD-10-CM | POA: Diagnosis not present

## 2024-02-24 DIAGNOSIS — F988 Other specified behavioral and emotional disorders with onset usually occurring in childhood and adolescence: Secondary | ICD-10-CM | POA: Insufficient documentation

## 2024-02-24 LAB — CBC WITH DIFFERENTIAL/PLATELET
Basophils Absolute: 0.1 K/uL (ref 0.0–0.1)
Basophils Relative: 0.8 % (ref 0.0–3.0)
Eosinophils Absolute: 0.2 K/uL (ref 0.0–0.7)
Eosinophils Relative: 2.2 % (ref 0.0–5.0)
HCT: 41.3 % (ref 36.0–46.0)
Hemoglobin: 14.1 g/dL (ref 12.0–15.0)
Lymphocytes Relative: 11.6 % — ABNORMAL LOW (ref 12.0–46.0)
Lymphs Abs: 0.8 K/uL (ref 0.7–4.0)
MCHC: 34.1 g/dL (ref 30.0–36.0)
MCV: 88.6 fl (ref 78.0–100.0)
Monocytes Absolute: 0.5 K/uL (ref 0.1–1.0)
Monocytes Relative: 7 % (ref 3.0–12.0)
Neutro Abs: 5.5 K/uL (ref 1.4–7.7)
Neutrophils Relative %: 78.4 % — ABNORMAL HIGH (ref 43.0–77.0)
Platelets: 182 K/uL (ref 150.0–400.0)
RBC: 4.66 Mil/uL (ref 3.87–5.11)
RDW: 13 % (ref 11.5–15.5)
WBC: 7.1 K/uL (ref 4.0–10.5)

## 2024-02-24 LAB — COMPREHENSIVE METABOLIC PANEL WITH GFR
ALT: 23 U/L (ref 3–35)
AST: 16 U/L (ref 5–37)
Albumin: 3.8 g/dL (ref 3.5–5.2)
Alkaline Phosphatase: 54 U/L (ref 39–117)
BUN: 12 mg/dL (ref 6–23)
CO2: 28 meq/L (ref 19–32)
Calcium: 8.7 mg/dL (ref 8.4–10.5)
Chloride: 106 meq/L (ref 96–112)
Creatinine, Ser: 0.71 mg/dL (ref 0.40–1.20)
GFR: 90.71 mL/min
Glucose, Bld: 88 mg/dL (ref 70–99)
Potassium: 3.8 meq/L (ref 3.5–5.1)
Sodium: 139 meq/L (ref 135–145)
Total Bilirubin: 0.4 mg/dL (ref 0.2–1.2)
Total Protein: 5.8 g/dL — ABNORMAL LOW (ref 6.0–8.3)

## 2024-02-24 NOTE — Progress Notes (Signed)
 "  Subjective:    Patient ID: Sonya [name], female    DOB: 1960-03-18, 64 y.o.   MRN: [id number]  Chief Complaint  Patient presents with   Diarrhea    Pt states having diarrhea and dizziness for 10 days. Pt states having headaches prior. Some nausea no vomiting.     HPI Patient is in today for diarrhea and dizziness.  Discussed the use of AI scribe software for clinical note transcription with the patient, who gave verbal consent to proceed.  History of Present Illness Sonya [name] is a 64 year old female who presents with ten days of diarrhea and abdominal pain.  She has experienced loose stools for the past ten days, occurring five to ten times daily, accompanied by back pain and occasional abdominal cramps. No vomiting has occurred, but she has experienced chills on several nights without fever. She is concerned about a possible Clostridioides difficile infection due to recent antibiotic use, including Augmentin .  She has taken Imodium and Pepto-Bismol sparingly, approximately ten times in total, to manage the diarrhea. Her diet includes probiotics, Greek yogurt, bananas, toast, and broth to alleviate symptoms. She has been consuming fluids like Gatorade and broth to maintain hydration.  She has a history of headaches and floaters, which were present before the onset of diarrhea. No fever, vomiting, or blood in the stool, but she reports chills, headaches, and floaters. Abdominal cramps are associated with diarrhea.  She has a history of allergies and uses Sudafed as needed. She also has ADHD and has experienced side effects from non-stimulant medications, preferring Adderall, which her children have used. She is on a weight management program with [employer] and has stopped taking a diuretic blood pressure medication due to fluid loss from diarrhea.    Past Medical History:  Diagnosis Date   Arthritis    Fibromyalgia    Hypertension     Past Surgical History:   Procedure Laterality Date   CESAREAN SECTION  1987    Family History  Problem Relation Age of Onset   Arthritis Mother    Hyperlipidemia Mother    Hypertension Mother    Diabetes Mother    Cancer - Other Mother        vulvar   Aortic stenosis Mother    Lung cancer Mother    Cancer - Lung Mother    Arthritis Father    Hyperlipidemia Father    Heart failure Father        Hx disease   Hypertension Father    Stroke Father    Prostate cancer Father    Macular degeneration Father    Alzheimer's disease Father    Dementia Father    Alcohol abuse Sister    Subarachnoid hemorrhage Sister    Heart attack Brother    Stomach cancer Maternal Grandmother    Stroke Paternal Grandfather    Lung cancer Paternal Grandfather        smoker   Healthy Son    Healthy Son     Social History   Socioeconomic History   Marital status: Legally Separated    Spouse name: Not on file   Number of children: Not on file   Years of education: Not on file   Highest education level: Not on file  Occupational History   Occupation: producer, television/film/video  Tobacco Use   Smoking status: Never   Smokeless tobacco: Never  Vaping Use   Vaping status: Never Used  Substance and Sexual Activity  Alcohol use: No   Drug use: No   Sexual activity: Not Currently    Partners: Male    Birth control/protection: None  Other Topics Concern   Not on file  Social History Narrative   Walks occasionally---  has 2 little dogs   Social Drivers of Health   Tobacco Use: Low Risk (02/24/2024)   Patient History    Smoking Tobacco Use: Never    Smokeless Tobacco Use: Never    Passive Exposure: Not on file  Financial Resource Strain: Not on file  Food Insecurity: Not on file  Transportation Needs: Not on file  Physical Activity: Not on file  Stress: Not on file  Social Connections: Not on file  Intimate Partner Violence: Not on file  Depression (PHQ2-9): Low Risk (11/05/2023)   Depression (PHQ2-9)    PHQ-2 Score: 3   Alcohol Screen: Not on file  Housing: Not on file  Utilities: Not on file  Health Literacy: Not on file    Outpatient Medications Prior to Visit  Medication Sig Dispense Refill   ALPRAZolam  (XANAX ) 0.5 MG tablet Take 1 tablet (0.5 mg total) by mouth 3 (three) times daily as needed. 30 tablet 1   amLODipine  (NORVASC ) 5 MG tablet Take 1 tablet (5 mg total) by mouth daily. 90 tablet 3   aspirin EC 81 MG tablet Take 81 mg by mouth daily. Swallow whole.     azelastine  (ASTELIN ) 0.1 % nasal spray Place 2 sprays into both nostrils 2 (two) times daily. Use in each nostril as directed 30 mL 3   Biotin 1000 MCG tablet Take 1,000 mcg by mouth daily.     CALCIUM  CITRATE-VITAMIN D3 PO Take 1 capsule by mouth daily.     Cholecalciferol (VITAMIN D3) 25 MCG (1000 UT) CAPS Take 1 capsule by mouth daily.     [employer] OMEGA-3 KRILL OIL 500 MG CAPS Take 1 capsule by mouth daily.     estradiol  (ESTRACE ) 0.5 MG tablet Take 1 tablet (0.5 mg total) by mouth daily. 90 tablet 3   Evolocumab  (REPATHA  SURECLICK) 140 MG/ML SOAJ INJECT 140 MG INTO THE SKIN EVERY 14 (FOURTEEN) DAYS. 6 mL 3   Evolocumab  (REPATHA  SURECLICK) 140 MG/ML SOAJ Inject 140 mg into the skin every 14 (fourteen) days. 1 mL 0   ezetimibe  (ZETIA ) 10 MG tablet Take 10 mg by mouth daily.     hydrochlorothiazide  (HYDRODIURIL ) 25 MG tablet Take 1 tablet (25 mg total) by mouth daily. 90 tablet 1   HYDROcodone -acetaminophen  (NORCO/VICODIN) 5-325 MG tablet Take 1 tablet by mouth every 6 (six) hours as needed for moderate pain (pain score 4-6). 45 tablet 0   LORazepam  (ATIVAN ) 0.5 MG tablet Take 1 tablet (0.5 mg total) by mouth every 6 (six) hours as needed for anxiety. 90 tablet 1   MAGNESIUM OXIDE PO Take 200 mg by mouth daily.     meclizine  (ANTIVERT ) 25 MG tablet Take 1 tablet (25 mg total) by mouth 3 (three) times daily as needed for dizziness. 30 tablet 0   medroxyPROGESTERone  (PROVERA ) 2.5 MG tablet Take 1 tablet (2.5 mg total) by mouth daily. 90 tablet 0    methocarbamol  (ROBAXIN ) 500 MG tablet TAKE 1 TABLET BY MOUTH FOUR TIMES A DAY 30 tablet 1   Phosphatidylserine 100 MG CAPS Take 1 capsule by mouth daily.     pregabalin  (LYRICA ) 25 MG capsule Take 1 capsule (25 mg total) by mouth 2 (two) times daily. 60 capsule 2   scopolamine  (TRANSDERM-SCOP) 1 MG/3DAYS Place  1 patch (1.5 mg total) onto the skin every 3 (three) days. 10 patch 12   Turmeric Curcumin 500 MG CAPS Take 1 capsule by mouth daily.     Ubiquinol 100 MG CAPS Take 1 mg by mouth daily.     atomoxetine  (STRATTERA ) 25 MG capsule Take 1 capsule (25 mg total) by mouth daily. (Patient not taking: Reported on 01/15/2024) 90 capsule 1   guanFACINE  (TENEX ) 1 MG tablet TAKE 1 TABLET BY MOUTH EVERYDAY AT BEDTIME (Patient not taking: Reported on 01/15/2024) 90 tablet 1   lidocaine  (XYLOCAINE ) 2 % solution Use as directed 15 mLs in the mouth or throat every 6 (six) hours as needed for mouth pain. Gargle and spit (Patient not taking: Reported on 02/12/2024) 100 mL 0   No facility-administered medications prior to visit.    Allergies  Allergen Reactions   Atorvastatin  Other (See Comments)    Leg cramps   Livalo  [Pitavastatin ] Other (See Comments)    Flu like symptoms - 2 mg   Praluent  [Alirocumab ]     Joint pain   Pravastatin  Other (See Comments)    40 mg -myalgias   Zetia  [Ezetimibe ] Other (See Comments)    Muscle Aches    Review of Systems  Constitutional:  Positive for chills. Negative for fever and malaise/fatigue.  HENT:  Negative for congestion.   Eyes:  Negative for blurred vision.  Respiratory:  Negative for shortness of breath.   Cardiovascular:  Negative for chest pain, palpitations and leg swelling.  Gastrointestinal:  Positive for diarrhea. Negative for abdominal pain, blood in stool, constipation, melena and nausea.  Genitourinary:  Negative for dysuria and frequency.  Musculoskeletal:  Negative for falls.  Skin:  Negative for rash.  Neurological:  Negative for dizziness,  loss of consciousness and headaches.  Endo/Heme/Allergies:  Negative for environmental allergies.  Psychiatric/Behavioral:  Negative for depression. The patient is not nervous/anxious.        Objective:    Physical Exam Vitals and nursing note reviewed.  Constitutional:      General: She is not in acute distress.    Appearance: Normal appearance. She is well-developed.  HENT:     Head: Normocephalic and atraumatic.  Eyes:     General: No scleral icterus.       Right eye: No discharge.        Left eye: No discharge.  Cardiovascular:     Rate and Rhythm: Normal rate and regular rhythm.     Heart sounds: No murmur heard. Pulmonary:     Effort: Pulmonary effort is normal. No respiratory distress.     Breath sounds: Normal breath sounds.  Musculoskeletal:        General: Normal range of motion.     Cervical back: Normal range of motion and neck supple.     Right lower leg: No edema.     Left lower leg: No edema.  Skin:    General: Skin is warm and dry.  Neurological:     Mental Status: She is alert and oriented to person, place, and time.  Psychiatric:        Mood and Affect: Mood normal.        Behavior: Behavior normal.        Thought Content: Thought content normal.        Judgment: Judgment normal.     BP 100/78 (BP Location: Left Arm, Patient Position: Sitting, Cuff Size: Normal)   Pulse 75   Temp 97.6 F (36.4 C) (Oral)  Resp 18   Ht 5' 4 (1.626 m)   Wt 159 lb (72.1 kg)   LMP 02/23/2016   SpO2 98%   BMI 27.29 kg/m  Wt Readings from Last 3 Encounters:  02/24/24 159 lb (72.1 kg)  01/15/24 162 lb (73.5 kg)  11/21/23 162 lb (73.5 kg)    Diabetic Foot Exam - Simple   No data filed    Lab Results  Component Value Date   WBC 4.6 11/21/2023   HGB 13.3 11/21/2023   HCT 39.8 11/21/2023   PLT 170.0 11/21/2023   GLUCOSE 89 11/21/2023   CHOL 169 11/21/2023   TRIG 67.0 11/21/2023   HDL 61.00 11/21/2023   LDLCALC 95 11/21/2023   ALT 14 11/21/2023   AST  18 11/21/2023   NA 140 11/21/2023   K 3.2 (L) 11/21/2023   CL 102 11/21/2023   CREATININE 0.72 11/21/2023   BUN 16 11/21/2023   CO2 31 11/21/2023   TSH 1.57 11/21/2023    Lab Results  Component Value Date   TSH 1.57 11/21/2023   Lab Results  Component Value Date   WBC 4.6 11/21/2023   HGB 13.3 11/21/2023   HCT 39.8 11/21/2023   MCV 88.5 11/21/2023   PLT 170.0 11/21/2023   Lab Results  Component Value Date   NA 140 11/21/2023   K 3.2 (L) 11/21/2023   CO2 31 11/21/2023   GLUCOSE 89 11/21/2023   BUN 16 11/21/2023   CREATININE 0.72 11/21/2023   BILITOT 0.7 11/21/2023   ALKPHOS 54 11/21/2023   AST 18 11/21/2023   ALT 14 11/21/2023   PROT 6.2 12/26/2023   ALBUMIN 4.1 11/21/2023   CALCIUM  9.2 12/26/2023   GFR 89.36 11/21/2023   Lab Results  Component Value Date   CHOL 169 11/21/2023   Lab Results  Component Value Date   HDL 61.00 11/21/2023   Lab Results  Component Value Date   LDLCALC 95 11/21/2023   Lab Results  Component Value Date   TRIG 67.0 11/21/2023   Lab Results  Component Value Date   CHOLHDL 3 11/21/2023   No results found for: HGBA1C     Assessment & Plan:  Diarrhea, unspecified type Assessment & Plan: Check c diff Con't brat diet  Check labs  If worsens -- go to [facility] or er   Orders: -     CBC with Differential/Platelet -     Comprehensive metabolic panel with GFR -     Cdiff NAA+O+P+Stool Culture; Future  Insomnia, unspecified type -     Ambulatory referral to Neurology  Attention deficit disorder, unspecified type Assessment & Plan: Refer to psych  Orders: -     Ambulatory referral to Psychiatry    Jamee JONELLE Antonio Cyndee, DO  "

## 2024-02-24 NOTE — Patient Instructions (Signed)
 Diarrhea, Adult Diarrhea is frequent loose and sometimes watery bowel movements. Diarrhea can make you feel weak and cause you to become dehydrated. Dehydration is a condition in which there is not enough water or other fluids in the body. Dehydration can make you tired and thirsty, cause you to have a dry mouth, and decrease how often you urinate. Diarrhea typically lasts 2-3 days. However, it can last longer if it is a sign of something more serious. It is important to treat your diarrhea as told by your health care provider. Follow these instructions at home: Eating and drinking     Follow these recommendations as told by your health care provider: Take an oral rehydration solution (ORS). This is an over-the-counter medicine that helps return your body to its normal balance of nutrients and water. It is found at pharmacies and retail stores. Drink enough fluid to keep your urine pale yellow. Drink fluids such as water, diluted fruit juice, and low-calorie sports drinks. You can drink milk also, if desired. Sucking on ice chips is another way to get fluids. Avoid drinking fluids that contain a lot of sugar or caffeine, such as soda, energy drinks, and regular sports drinks. Avoid alcohol. Eat bland, easy-to-digest foods in small amounts as you are able. These foods include bananas, applesauce, rice, lean meats, toast, and crackers. Avoid spicy or fatty foods.  Medicines Take over-the-counter and prescription medicines only as told by your health care provider. If you were prescribed antibiotics, take them as told by your health care provider. Do not stop using the antibiotic even if you start to feel better. General instructions  Wash your hands often using soap and water for at least 20 seconds. If soap and water are not available, use hand sanitizer. Others in the household should wash their hands as well. Hands should be washed: After using the toilet or changing a diaper. Before  preparing, cooking, or serving food. While caring for a sick person or while visiting someone in a hospital. Rest at home while you recover. Take a warm bath to relieve any burning or pain from frequent diarrhea episodes. Watch your condition for any changes. Contact a health care provider if: You have a fever. Your diarrhea gets worse. You have new symptoms. You vomit every time you eat or drink. You feel light-headed, dizzy, or have a headache. You have muscle cramps. You have signs of dehydration, such as: Dark urine, very little urine, or no urine. Cracked lips. Dry mouth. Sunken eyes. Sleepiness. Weakness. You have bloody or black stools or stools that look like tar. You have severe pain, cramping, or bloating in your abdomen. Your skin feels cold and clammy. You feel confused. Get help right away if: You have chest pain or your heart is beating very quickly. You have trouble breathing or you are breathing very quickly. You feel extremely weak or you faint. These symptoms may be an emergency. Get help right away. Call 911. Do not wait to see if the symptoms will go away. Do not drive yourself to the hospital. This information is not intended to replace advice given to you by your health care provider. Make sure you discuss any questions you have with your health care provider. Document Revised: 07/10/2021 Document Reviewed: 07/10/2021 Elsevier Patient Education  2024 [employer].

## 2024-02-24 NOTE — Assessment & Plan Note (Signed)
Refer to psych 

## 2024-02-24 NOTE — Assessment & Plan Note (Signed)
 Check c diff Con't brat diet  Check labs  If worsens -- go to [facility] or [facility]

## 2024-02-25 ENCOUNTER — Ambulatory Visit: Payer: Self-pay | Admitting: Family Medicine

## 2024-02-25 ENCOUNTER — Telehealth: Payer: Self-pay

## 2024-02-25 NOTE — Telephone Encounter (Signed)
 Copied from CRM #[id number]. Topic: Clinical - Lab/Test Results >> Feb 25, 2024  9:31 AM Delon DASEN wrote: Reason for CRM: calling for results from labs yesterday- [phone]- asking if abscessed tooth would affect the results

## 2024-02-26 ENCOUNTER — Telehealth (HOSPITAL_BASED_OUTPATIENT_CLINIC_OR_DEPARTMENT_OTHER): Payer: Self-pay | Admitting: Physical Therapy

## 2024-02-26 ENCOUNTER — Ambulatory Visit: Admitting: Family Medicine

## 2024-02-26 NOTE — Progress Notes (Unsigned)
 " Sonya [name] [facility] [address] [place] [address] Phone: [phone] Subjective:    I'm seeing this patient by the request  of:  Antonio Cyndee Jamee JONELLE, DO  CC:   YEP:Dlagzrupcz  12/24/2023 Patient given injection and tolerated the procedure well, discussed icing regimen and home exercises, discussed which activities to do and which ones to avoid.  Increase activity slowly.  Further evaluating for any uric acid deposits that could be potentially contributing.  Follow-up again in 6 to 12 weeks     Significant arthritic changes for patient's age.  No concerned that this is more than just osteoarthritic changes.  Some more laboratory workup ordered today to further evaluate.  Discussed icing regimen, discussed home exercises for strengthening and ergonomic changes that patient can make over the course of time.  Increase activity slowly.  Discussed icing regimen.  Follow-up again in 6 to 12 weeks      Update 03/01/2024 Sonya [name] is a 64 y.o. female coming in with complaint of R shoulder pain. Patient states       Past Medical History:  Diagnosis Date   Arthritis    Fibromyalgia    Hypertension    Past Surgical History:  Procedure Laterality Date   CESAREAN SECTION  1987   Social History   Socioeconomic History   Marital status: Legally Separated    Spouse name: Not on file   Number of children: Not on file   Years of education: Not on file   Highest education level: Not on file  Occupational History   Occupation: hair dresser  Tobacco Use   Smoking status: Never   Smokeless tobacco: Never  Vaping Use   Vaping status: Never Used  Substance and Sexual Activity   Alcohol use: No   Drug use: No   Sexual activity: Not Currently    Partners: Male    Birth control/protection: None  Other Topics Concern   Not on file  Social History Narrative   Walks occasionally---  has 2 little dogs   Social Drivers of Health   Tobacco Use: Low  Risk (02/24/2024)   Patient History    Smoking Tobacco Use: Never    Smokeless Tobacco Use: Never    Passive Exposure: Not on file  Financial Resource Strain: Not on file  Food Insecurity: Not on file  Transportation Needs: Not on file  Physical Activity: Not on file  Stress: Not on file  Social Connections: Not on file  Depression (PHQ2-9): Low Risk (11/05/2023)   Depression (PHQ2-9)    PHQ-2 Score: 3  Alcohol Screen: Not on file  Housing: Not on file  Utilities: Not on file  Health Literacy: Not on file   Allergies[1] Family History  Problem Relation Age of Onset   Arthritis Mother    Hyperlipidemia Mother    Hypertension Mother    Diabetes Mother    Cancer - Other Mother        vulvar   Aortic stenosis Mother    Lung cancer Mother    Cancer - Lung Mother    Arthritis Father    Hyperlipidemia Father    Heart failure Father        Hx disease   Hypertension Father    Stroke Father    Prostate cancer Father    Macular degeneration Father    Alzheimer's disease Father    Dementia Father    Alcohol abuse Sister    Subarachnoid hemorrhage Sister  Heart attack Brother    Stomach cancer Maternal Grandmother    Stroke Paternal Grandfather    Lung cancer Paternal Grandfather        smoker   Healthy Son    Healthy Son     Current Outpatient Medications (Endocrine & Metabolic):    estradiol  (ESTRACE ) 0.5 MG tablet, Take 1 tablet (0.5 mg total) by mouth daily.   medroxyPROGESTERone  (PROVERA ) 2.5 MG tablet, Take 1 tablet (2.5 mg total) by mouth daily.  Current Outpatient Medications (Cardiovascular):    amLODipine  (NORVASC ) 5 MG tablet, Take 1 tablet (5 mg total) by mouth daily.   Evolocumab  (REPATHA  SURECLICK) 140 MG/ML SOAJ, INJECT 140 MG INTO THE SKIN EVERY 14 (FOURTEEN) DAYS.   Evolocumab  (REPATHA  SURECLICK) 140 MG/ML SOAJ, Inject 140 mg into the skin every 14 (fourteen) days.   ezetimibe  (ZETIA ) 10 MG tablet, Take 10 mg by mouth daily.   hydrochlorothiazide   (HYDRODIURIL ) 25 MG tablet, Take 1 tablet (25 mg total) by mouth daily.  Current Outpatient Medications (Respiratory):    azelastine  (ASTELIN ) 0.1 % nasal spray, Place 2 sprays into both nostrils 2 (two) times daily. Use in each nostril as directed  Current Outpatient Medications (Analgesics):    aspirin EC 81 MG tablet, Take 81 mg by mouth daily. Swallow whole.   HYDROcodone -acetaminophen  (NORCO/VICODIN) 5-325 MG tablet, Take 1 tablet by mouth every 6 (six) hours as needed for moderate pain (pain score 4-6).  Current Outpatient Medications (Other):    ALPRAZolam  (XANAX ) 0.5 MG tablet, Take 1 tablet (0.5 mg total) by mouth 3 (three) times daily as needed.   Biotin 1000 MCG tablet, Take 1,000 mcg by mouth daily.   CALCIUM  CITRATE-VITAMIN D3 PO, Take 1 capsule by mouth daily.   Cholecalciferol (VITAMIN D3) 25 MCG (1000 UT) CAPS, Take 1 capsule by mouth daily.   [employer]S OMEGA-3 KRILL OIL 500 MG CAPS, Take 1 capsule by mouth daily.   LORazepam  (ATIVAN ) 0.5 MG tablet, Take 1 tablet (0.5 mg total) by mouth every 6 (six) hours as needed for anxiety.   MAGNESIUM OXIDE PO*, Take 200 mg by mouth daily.   meclizine  (ANTIVERT ) 25 MG tablet, Take 1 tablet (25 mg total) by mouth 3 (three) times daily as needed for dizziness.   methocarbamol  (ROBAXIN ) 500 MG tablet, TAKE 1 TABLET BY MOUTH FOUR TIMES A DAY   Phosphatidylserine 100 MG CAPS, Take 1 capsule by mouth daily.   pregabalin  (LYRICA ) 25 MG capsule, Take 1 capsule (25 mg total) by mouth 2 (two) times daily.   scopolamine  (TRANSDERM-SCOP) 1 MG/3DAYS, Place 1 patch (1.5 mg total) onto the skin every 3 (three) days.   Turmeric Curcumin 500 MG CAPS, Take 1 capsule by mouth daily.   Ubiquinol 100 MG CAPS, Take 1 mg by mouth daily.  * These medications belong to multiple therapeutic classes and are listed under each applicable group.   Reviewed prior external information including notes and imaging from  primary care provider As well as notes that were  available from care everywhere and other healthcare systems.  Past medical history, social, surgical and family history all reviewed in electronic medical record.  No pertanent information unless stated regarding to the chief complaint.   Review of Systems:  No headache, visual changes, nausea, vomiting, diarrhea, constipation, dizziness, abdominal pain, skin rash, fevers, chills, night sweats, weight loss, swollen lymph nodes, body aches, joint swelling, chest pain, shortness of breath, mood changes. POSITIVE muscle aches  Objective  Last menstrual period 02/23/2016.   General:  No apparent distress alert and oriented x3 mood and affect normal, dressed appropriately.  HEENT: Pupils equal, extraocular movements intact  Respiratory: Patient's speak in full sentences and does not appear short of breath  Cardiovascular: No lower extremity edema, non tender, no erythema      Impression and Recommendations:           [1]  Allergies Allergen Reactions   Atorvastatin  Other (See Comments)    Leg cramps   Livalo  [Pitavastatin ] Other (See Comments)    Flu like symptoms - 2 mg   Praluent  [Alirocumab ]     Joint pain   Pravastatin  Other (See Comments)    40 mg -myalgias   Zetia  [Ezetimibe ] Other (See Comments)    Muscle Aches   "

## 2024-02-26 NOTE — Telephone Encounter (Signed)
 Spoke with pt- denied open slots on 1/22 due to being ill.  Chelbie Jarnagin C. Ural Acree PT, DPT 02/26/24 7:56 AM

## 2024-02-27 ENCOUNTER — Other Ambulatory Visit: Payer: Self-pay

## 2024-02-27 ENCOUNTER — Telehealth: Payer: Self-pay

## 2024-02-27 MED ORDER — VANCOMYCIN HCL 125 MG PO CAPS: 125.0000 mg | ORAL_CAPSULE | Freq: Four times a day (QID) | ORAL | 0 refills | Status: AC

## 2024-02-27 NOTE — Telephone Encounter (Signed)
 Copied from CRM #[id number]. Topic: Clinical - Lab/Test Results >> Feb 27, 2024  9:36 AM Avram MATSU wrote: Reason for CRM: patient mentioned cramps and they provider talked about calling something in. Patient was informed of the turnaround time and a nurse will callback Please advise [phone]

## 2024-02-27 NOTE — Telephone Encounter (Signed)
Spoke with patient. Pt verbalized understanding. Rx sent in 

## 2024-02-28 LAB — CDIFF NAA+O+P+STOOL CULTURE
E coli, Shiga toxin Assay: NEGATIVE
Toxigenic C. Difficile by PCR: POSITIVE — AB

## 2024-03-01 ENCOUNTER — Ambulatory Visit: Admitting: Family Medicine

## 2024-03-09 ENCOUNTER — Other Ambulatory Visit: Payer: Self-pay | Admitting: Family Medicine

## 2024-03-09 ENCOUNTER — Encounter

## 2024-03-09 ENCOUNTER — Encounter: Admitting: *Deleted

## 2024-03-09 VITALS — BP 122/78 | HR 58 | Temp 97.7°F | Resp 16

## 2024-03-09 DIAGNOSIS — Z006 Encounter for examination for normal comparison and control in clinical research program: Secondary | ICD-10-CM

## 2024-03-09 DIAGNOSIS — I10 Essential (primary) hypertension: Secondary | ICD-10-CM

## 2024-03-12 ENCOUNTER — Ambulatory Visit: Admitting: Family Medicine

## 2024-04-08 ENCOUNTER — Ambulatory Visit: Admitting: Family Medicine

## 2024-04-12 ENCOUNTER — Encounter
